# Patient Record
Sex: Male | Born: 1955
Health system: Southern US, Community
[De-identification: ages and names within clinical notes are randomized; demographics above are authoritative.]

## PROBLEM LIST (undated history)

## (undated) DIAGNOSIS — I10 Essential (primary) hypertension: Secondary | ICD-10-CM

## (undated) HISTORY — PX: NO PAST SURGERIES: SHX2092

---

## 2001-04-04 ENCOUNTER — Encounter: Payer: Self-pay | Admitting: *Deleted

## 2001-04-04 ENCOUNTER — Emergency Department (HOSPITAL_COMMUNITY): Admission: EM | Admit: 2001-04-04 | Discharge: 2001-04-04 | Payer: Self-pay | Admitting: *Deleted

## 2002-10-08 ENCOUNTER — Encounter: Payer: Self-pay | Admitting: Emergency Medicine

## 2002-10-08 ENCOUNTER — Emergency Department (HOSPITAL_COMMUNITY): Admission: EM | Admit: 2002-10-08 | Discharge: 2002-10-08 | Payer: Self-pay | Admitting: Emergency Medicine

## 2009-06-18 ENCOUNTER — Emergency Department (HOSPITAL_COMMUNITY): Admission: EM | Admit: 2009-06-18 | Discharge: 2009-06-18 | Payer: Self-pay | Admitting: Emergency Medicine

## 2011-03-19 ENCOUNTER — Encounter: Payer: Self-pay | Admitting: *Deleted

## 2011-03-19 ENCOUNTER — Emergency Department (HOSPITAL_COMMUNITY): Payer: Medicare Other

## 2011-03-19 ENCOUNTER — Emergency Department (HOSPITAL_COMMUNITY)
Admission: EM | Admit: 2011-03-19 | Discharge: 2011-03-20 | Disposition: A | Discharge status: Home / Self Care | Payer: Medicare Other | Attending: Emergency Medicine | Admitting: Emergency Medicine

## 2011-03-19 DIAGNOSIS — Z7982 Long term (current) use of aspirin: Secondary | ICD-10-CM | POA: Insufficient documentation

## 2011-03-19 DIAGNOSIS — S42213A Unspecified displaced fracture of surgical neck of unspecified humerus, initial encounter for closed fracture: Secondary | ICD-10-CM

## 2011-03-19 DIAGNOSIS — W1789XA Other fall from one level to another, initial encounter: Secondary | ICD-10-CM | POA: Insufficient documentation

## 2011-03-19 DIAGNOSIS — F17200 Nicotine dependence, unspecified, uncomplicated: Secondary | ICD-10-CM | POA: Insufficient documentation

## 2011-03-19 MED ORDER — HYDROMORPHONE HCL 1 MG/ML IJ SOLN
0.5000 mg | Freq: Once | INTRAMUSCULAR | Status: AC
  Administered 2011-03-19: 0.5 mg via INTRAVENOUS
  Filled 2011-03-19: qty 1

## 2011-03-19 MED ORDER — HYDROMORPHONE HCL 1 MG/ML IJ SOLN
1.0000 mg | Freq: Once | INTRAMUSCULAR | Status: AC
  Administered 2011-03-19: 1 mg via INTRAVENOUS
  Filled 2011-03-19: qty 1

## 2011-03-19 MED ORDER — OXYCODONE-ACETAMINOPHEN 5-325 MG PO TABS: 1.0000 | ORAL_TABLET | Freq: Four times a day (QID) | ORAL | Status: AC | PRN

## 2011-03-19 NOTE — ED Provider Notes (Addendum)
History    Scribed for Gavin Pound. Bryndon Cumbie, MD, the patient was seen in room APA18/APA18. This chart was scribed by Katha Cabal. This patient's care was started at 9:45PM.  CSN: [id number] Arrival date & time: 03/19/2011  9:03 PM  Chief Complaint  Patient presents with  . Fall  . Shoulder Pain    left shoulder   Patient is a 55 y.o. male presenting with fall and shoulder pain.  Fall  Shoulder Pain   A 55 year old male presents to the ED with constant Left shoulder pain onset tonight.  Pain aggravated by movement.  Pt denies numbness and tingling.  Pt was on the porch and there were no available seats.  Pt leaned on railing of of porch about 4 feet high and toppled over and landed on left shoulder.  Pt is right handed.  Pt last ate around noon and admitted to drinking a beer a couple hours ago and drank water prior to arrival.   PAST MEDICAL HISTORY:  Past Medical History  Diagnosis Date  . Neurofibromatosis (severe)      PAST SURGICAL HISTORY:  History reviewed. No pertinent past surgical history.  MEDICATIONS:  Previous Medications   ASPIRIN-SALICYLAMIDE-CAFFEINE (BC FAST PAIN RELIEF) [id number] MG PACK    Take by mouth.       ALLERGIES:  Allergies as of 03/19/2011  . (No Known Allergies)     FAMILY HISTORY:  History reviewed. No pertinent family history.   SOCIAL HISTORY: History   Social History  . Marital Status: Single    Spouse Name: N/A    Number of Children: N/A  . Years of Education: N/A   Social History Main Topics  . Smoking status: Current Some Day Smoker  . Smokeless tobacco: None  . Alcohol Use: Yes     occasionally  . Drug Use: No  . Sexually Active:    Other Topics Concern  . None   Social History Narrative  . None     Review of Systems .ROS  10 Systems reviewed and are negative for acute change except as noted in the HPI.   Physical Exam  BP 131/93  Pulse 81  Temp(Src) 98.1 F (36.7 C) (Oral)  Resp 20  Ht 6' 0.5" (1.842 m)   Wt 146 lb (66.225 kg)  BMI 19.53 kg/m2  SpO2 100%  Physical Exam  Nursing note and vitals reviewed. Constitutional: He is oriented to person, place, and time. He appears well-developed and well-nourished.  HENT:  Head: Normocephalic and atraumatic.  Eyes: Pupils are equal, round, and reactive to light.  Neck: Normal range of motion and full passive range of motion without pain. Neck supple. No spinous process tenderness present. Normal range of motion present.  Cardiovascular: Normal rate.   Pulses:      Radial pulses are 2+ on the left side.  Pulmonary/Chest: Effort normal. No respiratory distress.  Musculoskeletal:       Right shoulder: Normal. Tenderness: mild Left shoulder.       Left shoulder: He exhibits decreased range of motion, tenderness and deformity.       Left shoulder deformity appears like possible dislocation.  No left elbow painand no left forearm tenderness.  Pt is able to move fingers and thumb.  Distal neurovascular intact.   Neurological: He is alert and oriented to person, place, and time.  Skin: Skin is warm and dry.  Psychiatric: He has a normal mood and affect. His behavior is normal.    ED  Course  Procedures  OTHER DATA REVIEWED: Nursing notes, vital signs, and past medical records reviewed.    DIAGNOSTIC STUDIES: Oxygen Saturation is 100% on room air,  nml by my interpretation.     LABS / RADIOLOGY: No results found for this or any previous visit. Dg Shoulder Left Port  03/19/2011  *RADIOLOGY REPORT*  Clinical Data: Fall, pain.  PORTABLE LEFT SHOULDER - 2+ VIEW  Comparison: None.  Findings: The patient has a surgical neck fracture of the left humerus with one shaft width anterior displacement. Acromioclavicular joint is intact.  No other fracture is identified.  Multiple soft tissue densities project over the upper chest wall.  IMPRESSION:  1.  Surgical neck fracture left humerus. 2.  Multiple soft tissue nodules.  Question neurofibromatosis.   Original Report Authenticated By: Bernadene Bell. D'ALESSIO, M.D.       MDM: The patient is status post fall from about 3-4 feet off of a railing from his back porch. The patient denies head injury loss of consciousness neck pain or back pain. Patient has pain and deformity isolated to left shoulder. On initial exam, to me it appeared that patient may have had a shoulder dislocation. However upon review of his plain films in concurrence with radiology interpretation is acute surgical neck fracture of his left humerus. Patient is intact to distal neurovascular status. Strong grip intact and intact to axillary ulnar median and radial nerves of the left UE. I will discuss with orthopedist on call to ensure the patient has appropriate followup. They'll continue to adequately treat his pain at this point and ordered a left shoulder immobilizer for stabilization.    PLAN: d/c The patient is to return the emergency department if there is any worsening of symptoms. I have reviewed the discharge instructions with the patient/family   CONDITION ON DISCHARGE: stable    I personally performed the services described in this documentation, which was scribed in my presence. The recorded information has been reviewed and considered. Gavin Pound. Oletta Lamas, MD  11:40 PM Spoke to Dr. Charlann Boxer who recommends follow up with Dr. Rennis Chris or Ranell Patrick with his group.  He agrees with immobilizer and analgesics for now.    Gavin Pound. Oletta Lamas, MD 03/19/11 2340  Gavin Pound. Oletta Lamas, MD 03/19/11 4098

## 2011-03-19 NOTE — ED Notes (Signed)
Pt c/o pain to left shoulder, pt states he is unable to lift or move shoulder; pt states he was sitting on a rail on his porch and the rail gave away and he fell approximately 3 ft to the ground landing on his left arm

## 2011-03-19 NOTE — Discharge Instructions (Signed)
Narcotic and benzodiazepine use may cause drowsiness, slowed breathing or dependence.  Please use with caution and do not drive, operate machinery or watch young children alone while taking them.  Taking combinations of these medications or drinking alcohol will potentiate these effects.    

## 2011-03-23 ENCOUNTER — Encounter (HOSPITAL_COMMUNITY): Payer: Self-pay | Admitting: *Deleted

## 2011-03-25 ENCOUNTER — Ambulatory Visit (HOSPITAL_COMMUNITY): Payer: Medicare Other

## 2011-03-25 ENCOUNTER — Ambulatory Visit (HOSPITAL_COMMUNITY)
Admission: RE | Admit: 2011-03-25 | Discharge: 2011-03-26 | Disposition: A | Discharge status: Home / Self Care | Payer: Medicare Other | Source: Ambulatory Visit | Attending: Orthopedic Surgery | Admitting: Orthopedic Surgery

## 2011-03-25 DIAGNOSIS — W19XXXA Unspecified fall, initial encounter: Secondary | ICD-10-CM | POA: Insufficient documentation

## 2011-03-25 DIAGNOSIS — J449 Chronic obstructive pulmonary disease, unspecified: Secondary | ICD-10-CM | POA: Insufficient documentation

## 2011-03-25 DIAGNOSIS — J4489 Other specified chronic obstructive pulmonary disease: Secondary | ICD-10-CM | POA: Insufficient documentation

## 2011-03-25 DIAGNOSIS — Z01812 Encounter for preprocedural laboratory examination: Secondary | ICD-10-CM | POA: Insufficient documentation

## 2011-03-25 DIAGNOSIS — Q8500 Neurofibromatosis, unspecified: Secondary | ICD-10-CM | POA: Insufficient documentation

## 2011-03-25 DIAGNOSIS — Z79899 Other long term (current) drug therapy: Secondary | ICD-10-CM | POA: Insufficient documentation

## 2011-03-25 DIAGNOSIS — S42309A Unspecified fracture of shaft of humerus, unspecified arm, initial encounter for closed fracture: Secondary | ICD-10-CM | POA: Insufficient documentation

## 2011-03-25 DIAGNOSIS — F17200 Nicotine dependence, unspecified, uncomplicated: Secondary | ICD-10-CM | POA: Insufficient documentation

## 2011-03-25 LAB — URINALYSIS, ROUTINE W REFLEX MICROSCOPIC
Bilirubin Urine: NEGATIVE
Hgb urine dipstick: NEGATIVE
Nitrite: NEGATIVE
Specific Gravity, Urine: 1.01 (ref 1.005–1.030)
pH: 6.5 (ref 5.0–8.0)

## 2011-03-25 LAB — COMPREHENSIVE METABOLIC PANEL
ALT: 9 U/L (ref 0–53)
AST: 18 U/L (ref 0–37)
Calcium: 9.5 mg/dL (ref 8.4–10.5)
GFR calc Af Amer: >60 mL/min (ref >60)
Sodium: 139 mEq/L (ref 135–145)
Total Protein: 6.5 g/dL (ref 6.0–8.3)

## 2011-03-25 LAB — CBC
Platelets: 199 10*3/uL (ref 150–400)
RBC: 3.9 MIL/uL — ABNORMAL LOW (ref 4.22–5.81)
WBC: 6.5 10*3/uL (ref 4.0–10.5)

## 2011-03-25 LAB — APTT: aPTT: 33 seconds (ref 24–37)

## 2011-03-25 LAB — PROTIME-INR
INR: 1.08 (ref 0.00–1.49)
Prothrombin Time: 14.2 seconds (ref 11.6–15.2)

## 2011-03-25 LAB — SURGICAL PCR SCREEN
MRSA, PCR: NEGATIVE
Staphylococcus aureus: NEGATIVE

## 2011-05-18 NOTE — Op Note (Signed)
Kevin Dalton, Kevin Dalton NO.:  [id number]  MEDICAL RECORD NO.:  [id number]  LOCATION:  5040                         FACILITY:  [facility]  PHYSICIAN:  Vania Rea. Deo Mehringer, M.D.  DATE OF BIRTH:  1956-03-22  DATE OF PROCEDURE:  03/25/2011 DATE OF DISCHARGE:                              OPERATIVE REPORT   PREOPERATIVE DIAGNOSIS:  Displaced left two-part proximal humerus fracture.  POSTOPERATIVE DIAGNOSIS:  Displaced left two-part proximal humerus fracture.  PROCEDURE:  Open reduction and internal fixation of displaced left two- part proximal humerus fracture utilizing an SNP plate.  SURGEON:  Vania Rea. Esten Dollar, M.D.  Threasa HeadsFrench Ana A. Shuford, PA-C.  ANESTHESIA:  General endotracheal as well as an interscalene block.  ESTIMATED BLOOD LOSS:  150 mL.  DRAINS:  None.  HISTORY:  Kevin Dalton is a 55 year old gentleman who fell this past weekend sustaining a displaced left two-part proximal humerus fracture.  He was seen initially in the [facility] emergency room, placed in a sling, was evaluated in my office earlier this week where he was found to have a severely displaced proximal humerus fracture with the humeral shaft segment and paling the skin anteriorly and tenting the skin, but there was no obvious skin violation.  This was indeed a closed fracture.  He was found to be grossly neurovascularly intact.  Due to the degree of displacement and potential for skin breakdown, he was brought to the operating room at this time for planned open reduction and internal fixation.  Preoperatively, counseled Kevin Dalton on treatment options as well as risks versus benefits thereof.  Possible surgical complications were reviewed including the potential for bleeding, infection, neurovascular injury, malunion, nonunion, loss of fixation, and possible need for additional surgery.  He understands and accepts and agrees with our planned procedure.  PROCEDURE IN DETAIL:  After  undergoing routine preop evaluation, the patient received prophylactic antibiotics and an interscalene block was established in the holding area by the Anesthesia Department.  Placed supine on the operating  table and underwent smooth induction of general endotracheal anesthesia.  Placed in beach-chair position and appropriately padded and protected.  Left shoulder girdle region was sterilely prepped and draped in standard fashion.  Time-out was called. Fluoroscopy was brought in and used to confirm that we could properly visualize the shoulder.  Following this, we then performed a anterolateral deltoid splitting approach with an incision of lateral margin of the acromion and then extending distally for approximately 5 cm.  Of note, we did place the incision such that we avoided the very large multiple neurofibromas about the shoulder.  Dissection was carried deeply.  Electrocautery was used for hemostasis.  The deltoid was then split at the junction between the anterior mid thirds with care taken to protect the distal nerve structures.  The subacromial/subdeltoid bursal space was then freed of scar tissue.  The impacted segment of the humeral shaft was then carefully disengaged from the muscle and subcutaneous tissues.  The fracture site was irrigated and under direct visualization, reduction was achieved and fluoroscopic imaging showed overall good alignment.  Following this, we then prepared for the insertion of the intramedullary implant.  We did  use a rongeur to cut a small trough in the lateral cortex of the humerus proximally so that we could that seat the SNP plate to the appropriate depth.  Once this was completed, we confirmed seating of the implant to the proper depth. Reduction was then obtained and a central guidewire was then placed in the center of the humeral head, noted both on two orthogonal views.  We then placed a series of pegs into the peripheral peg holes and  then replaced the central pin with a central peg, and all these obtained good penetration and purchase of the humeral head bone.  Next, we then used the outrigger guide to place three unicortical locking screws into the implant and the shaft of the humerus.  The outrigger was removed.  Final x-rays were obtained.  We did use live fluoro to confirm that all pegs were contained within the humeral head, and there was free motion, good alignment, good stability.  The wound was then irrigated.  The deltoid split was then repaired with figure-of-eight #1 Vicryl sutures.  2-0 Vicryl used for the subcu layer and intracuticular 3-0 Monocryl for the skin followed by Steri-Strips.  Dry dressing was applied.  The left arm was placed in sling.  The patient was then awakened, extubated, and taken to recovery room in stable condition.     Vania Rea. Huber Mathers, M.D.     KMS/MEDQ  D:  03/25/2011  T:  03/25/2011  Job:  [id number]  Electronically Signed by Francena Hanly M.D. on 05/18/2011 06:43:05 PM

## 2011-06-01 ENCOUNTER — Ambulatory Visit (HOSPITAL_COMMUNITY)
Admission: RE | Admit: 2011-06-01 | Discharge: 2011-06-01 | Disposition: A | Discharge status: Home / Self Care | Payer: Medicare Other | Source: Ambulatory Visit | Attending: Orthopedic Surgery | Admitting: Orthopedic Surgery

## 2011-06-01 DIAGNOSIS — M25619 Stiffness of unspecified shoulder, not elsewhere classified: Secondary | ICD-10-CM | POA: Insufficient documentation

## 2011-06-01 DIAGNOSIS — M6281 Muscle weakness (generalized): Secondary | ICD-10-CM | POA: Insufficient documentation

## 2011-06-01 DIAGNOSIS — M25519 Pain in unspecified shoulder: Secondary | ICD-10-CM | POA: Insufficient documentation

## 2011-06-01 DIAGNOSIS — IMO0001 Reserved for inherently not codable concepts without codable children: Secondary | ICD-10-CM | POA: Insufficient documentation

## 2011-06-01 DIAGNOSIS — S42293A Other displaced fracture of upper end of unspecified humerus, initial encounter for closed fracture: Secondary | ICD-10-CM | POA: Insufficient documentation

## 2011-06-01 NOTE — Progress Notes (Signed)
Occupational Therapy Evaluation  Patient Details  Name: Kevin Dalton MRN: [id number] Date of Birth: 1955/12/15  Today's Date: 06/01/2011 Time: 3086-5784 OT Eval 1350-1405 15' Manual Therapy [phone] 11' Therapeutic Exercises [phone] 10' Time Calculation (min): 38 min Visit#: 1  of 16   Re-eval: 06/29/11  Assessment Diagnosis: S/P Left Proximal Humerus Fracture - Dr. Rennis Chris Surgical Date: 03/25/11 Next MD Visit: unknown Prior Therapy: none  Past Medical History:  Past Medical History  Diagnosis Date  . Neurofibromatosis    Past Surgical History: No past surgical history on file.  Subjective Symptoms/Limitations Symptoms: S:  I fell off of a porch in Aug and broke my left shoulder.   Limitations: History:  Mr. Taboada states that he was leaning against his porch railing when it gave way.  He fell, landing on his left shoulder and fracturing his left proximal humerus.  He had surgery to repair the fracture.  He has been completing exercises at home, but is still experiencing significant loss of movement and increased pain.  He has been referred by Dr. Rennis Chris to occupational therapy for evaluation and treatment. Pain Assessment Currently in Pain?: No/denies  Assessment LUE Assessment LUE Assessment:  (asssessed in supine ER and IR 0 abd) LUE AROM (degrees) Left Shoulder Flexion  0-170: 90 Degrees Left Shoulder ABduction 0-40: 60 Degrees Left Shoulder Internal Rotation  0-70: 90 Degrees Left Shoulder External Rotation  0-90: 20 Degrees LUE PROM (degrees) Left Shoulder Flexion  0-170: 120 Degrees Left Shoulder ABduction 0-40: 115 Degrees Left Shoulder Internal Rotation  0-70: 90 Degrees Left Shoulder External Rotation  0-90: 64 Degrees LUE Strength Left Shoulder Flexion: 3+/5 Left Shoulder ABduction: 3+/5 Left Shoulder Internal Rotation: 3+/5 Left Shoulder External Rotation: 3+/5  Exercise/Treatments  06/01/11 0700  Shoulder Exercises: Supine  Protraction PROM;5  reps;AAROM;10 reps  Horizontal ABduction PROM;5 reps;AAROM;10 reps  External Rotation PROM;5 reps;AAROM;10 reps  Internal Rotation PROM;5 reps;AAROM;10 reps  Flexion PROM;5 reps;AAROM;10 reps  ABduction PROM;5 reps;AAROM;10 reps   Manual Therapy Manual Therapy: Myofascial release Myofascial Release: MFR and manual stretching to left upper arm, scapular, and shoulder region to decrease pain and fascial restrictions and increase AROM and strength. [phone]  Occupational Therapy Assessment and Plan OT Assessment and Plan Clinical Impression Statement: A:  Patient presents with decreased mobility and strength and increased pain and stiffness s/p left proximal humerus fracture, causing decreased I with B/IADLs and leisure activities. Rehab Potential: Good OT Frequency: Min 2X/week OT Duration: 8 weeks OT Treatment/Interventions: Self-care/ADL training;Therapeutic exercise;Manual therapy;Patient/family education;Other (comment) (modalities PRN.  HEP:  dowel in supine) OT Plan: P:  Skilled OT intervention to decrease pain and restrictions and increase AROM and strength needed to complete ADLs with LUE.  Treatment Plan:  MFR as outlined above.  Ther Ex:  Supine PROM and dowel, progress to AROM as tolerated.  seated elev, ext, ret, row.  therapy ball flex and abd, thumb tacks, prot/ret//elev/dep, pulleys, progress as tolerated.   Goals Short Term Goals Time to Complete Short Term Goals: 4 weeks Short Term Goal 1: Patient will be educated on a HEP. Short Term Goal 2: Patient will increase PROM to [facility] for increased ability to don his coats. Short Term Goal 3: Patient will increase his strength to 4-/5 in his left shoulder for increased ability to lift cooking pots and pans. Short Term Goal 4: Patient will decrease pain to 4/10 when actively using his left arm. Short Term Goal 5: Patient will decrease fascial restrictions from max to mod in his  left shoulder region. Long Term Goals Time to Complete  Long Term Goals: 8 weeks Long Term Goal 1: Patient will return to prior level of independence with all B/IADLs and leisure activities. Long Term Goal 2: Patient will increase left shoulder AROM to [facility] for increased ability to don his coat. Long Term Goal 3: Patient will increase his left shoulder strength to 4/5 for increased ability to lift household cleaning items. Long Term Goal 4: Patient will decrease pain to 2/10 in his left shoulder while completing his ADLs. Long Term Goal 5: Patient will decrease fascial restrictions to minimal in his left shoulder region. End of Session Patient Active Problem List  Diagnoses  . Other closed fractures of upper end of humerus  . Pain in joint, shoulder region  . Muscle weakness (generalized)   End of Session Activity Tolerance: Patient tolerated treatment well General Behavior During Session: [facility] for tasks performed Cognition: [facility] for tasks performed  Time Calculation Start Time: 1350 Stop Time: 1428 Time Calculation (min): 38 min  Shirlean Mylar, OTR/L  06/01/2011, 3:02 PM  Physician Documentation Your signature is required to indicate approval of the treatment plan as stated above.  Please sign and either send electronically or make a copy of this report for your files and return this physician signed original.  Please mark one 1.__approve of plan  2. ___approve of plan with the following conditions.   ______________________________                                                          _____________________ Physician Signature                                                                                                             Date

## 2011-06-08 ENCOUNTER — Ambulatory Visit (HOSPITAL_COMMUNITY): Payer: Medicare Other | Admitting: Specialist

## 2011-06-09 ENCOUNTER — Telehealth (HOSPITAL_COMMUNITY): Payer: Self-pay

## 2011-06-10 ENCOUNTER — Ambulatory Visit (HOSPITAL_COMMUNITY)
Admission: RE | Admit: 2011-06-10 | Discharge: 2011-06-10 | Disposition: A | Discharge status: Home / Self Care | Payer: Medicare Other | Source: Ambulatory Visit | Attending: Orthopedic Surgery | Admitting: Orthopedic Surgery

## 2011-06-10 DIAGNOSIS — M25619 Stiffness of unspecified shoulder, not elsewhere classified: Secondary | ICD-10-CM | POA: Insufficient documentation

## 2011-06-10 DIAGNOSIS — S42293A Other displaced fracture of upper end of unspecified humerus, initial encounter for closed fracture: Secondary | ICD-10-CM

## 2011-06-10 DIAGNOSIS — IMO0001 Reserved for inherently not codable concepts without codable children: Secondary | ICD-10-CM | POA: Insufficient documentation

## 2011-06-10 DIAGNOSIS — M6281 Muscle weakness (generalized): Secondary | ICD-10-CM

## 2011-06-10 DIAGNOSIS — M25519 Pain in unspecified shoulder: Secondary | ICD-10-CM

## 2011-06-10 NOTE — Progress Notes (Signed)
Occupational Therapy Treatment  Patient Details  Name: Kevin Dalton MRN: [id number] Date of Birth: [birth year]  Today's Date: 06/10/2011 Time: 0102-7253 Time Calculation (min): 37 min Manual Therapy [phone] 14' Therapeutic Exercises [phone] 22' Visit#: 2  of 16   Re-eval: 06/29/11    Subjective Symptoms/Limitations Symptoms: S:  I have been doing my exercises.  I dont have any pain today. Pain Assessment Currently in Pain?: No/denies  O:  Exercise/Treatments Supine Protraction: PROM;AAROM;10 reps Horizontal ABduction: PROM;AAROM;10 reps External Rotation: PROM;AAROM;10 reps Internal Rotation: PROM;AAROM;10 reps Flexion: PROM;AAROM;10 reps ABduction: PROM;AAROM;10 reps Seated Elevation: AROM;10 reps Extension: AROM;10 reps Retraction: AROM;10 reps Row: AROM;10 reps Pulleys Flexion: 2 minutes ABduction: 2 minutes Therapy Ball Flexion: 15 reps ABduction: 15 reps   Manual Therapy Manual Therapy: Myofascial release Myofascial Release: MFR and manual stretching to left upper arm, scapular region, and shoulder region to decrease pain and fascial restrictions and increase AROM and strength.  [phone]   Occupational Therapy Assessment and Plan OT Assessment and Plan Clinical Impression Statement: A:  PROM throught 100 degrees flexion and abduction in supine. OT Plan: P:  Attempt thumbtacks, prot/ret//elev/dep and low wall wash.   Goals Short Term Goals Time to Complete Short Term Goals: 4 weeks Short Term Goal 1: Patient will be educated on a HEP. Short Term Goal 1 Progress: Progressing toward goal Short Term Goal 2: Patient will increase PROM to [facility] for increased ability to don his coats. Short Term Goal 2 Progress: Progressing toward goal Short Term Goal 3: Patient will increase his strength to 4-/5 in his left shoulder for increased ability to lift cooking pots and pans. Short Term Goal 3 Progress: Progressing toward goal Short Term Goal 4: Patient will decrease  pain to 4/10 when actively using his left arm. Short Term Goal 4 Progress: Progressing toward goal Short Term Goal 5: Patient will decrease fascial restrictions from max to mod in his left shoulder region. Short Term Goal 5 Progress: Progressing toward goal Long Term Goals Time to Complete Long Term Goals: 8 weeks Long Term Goal 1: Patient will return to prior level of independence with all B/IADLs and leisure activities. Long Term Goal 1 Progress: Progressing toward goal Long Term Goal 2: Patient will increase left shoulder AROM to [facility] for increased ability to don his coat. Long Term Goal 2 Progress: Progressing toward goal Long Term Goal 3: Patient will increase his left shoulder strength to 4/5 for increased ability to lift household cleaning items. Long Term Goal 3 Progress: Progressing toward goal Long Term Goal 4: Patient will decrease pain to 2/10 in his left shoulder while completing his ADLs. Long Term Goal 4 Progress: Progressing toward goal Long Term Goal 5: Patient will decrease fascial restrictions to minimal in his left shoulder region. Long Term Goal 5 Progress: Progressing toward goal End of Session Patient Active Problem List  Diagnoses  . Other closed fractures of upper end of humerus  . Pain in joint, shoulder region  . Muscle weakness (generalized)   End of Session Activity Tolerance: Patient tolerated treatment well General Behavior During Session: [facility] for tasks performed Cognition: [facility] for tasks performed   Shirlean Mylar, OTR/L  06/10/2011, 10:09 AM

## 2011-06-16 ENCOUNTER — Ambulatory Visit (HOSPITAL_COMMUNITY)
Admission: RE | Admit: 2011-06-16 | Discharge: 2011-06-16 | Disposition: A | Discharge status: Home / Self Care | Payer: Medicare Other | Source: Ambulatory Visit | Attending: Orthopedic Surgery | Admitting: Orthopedic Surgery

## 2011-06-16 DIAGNOSIS — S42293A Other displaced fracture of upper end of unspecified humerus, initial encounter for closed fracture: Secondary | ICD-10-CM

## 2011-06-16 DIAGNOSIS — M25519 Pain in unspecified shoulder: Secondary | ICD-10-CM

## 2011-06-16 DIAGNOSIS — M6281 Muscle weakness (generalized): Secondary | ICD-10-CM

## 2011-06-16 NOTE — Progress Notes (Signed)
Occupational Therapy Treatment  Patient Details  Name: Kevin Dalton MRN: [id number] Date of Birth: Dec 27, 1955  Today's Date: 06/16/2011 Time: 5621-3086 Time Calculation (min): 39 min Visit#: 3  of 16   Re-eval: 06/29/11 Manuel Therapy  5784-6962 23' Therapeutic Exercise  [phone] 15'    Subjective Symptoms/Limitations Symptoms: S:  It just hurts at night time mostly. Special Tests: MFR and manual stretching to left upper arm, scapular, and shoulder region to decrease pain and fascial restrictions and increase AROM and strength Pain Assessment Currently in Pain?: No/denies   Exercise/Treatments Supine Protraction: PROM;AAROM;12 reps Horizontal ABduction: PROM;AAROM;12 reps External Rotation: PROM;AAROM;12 reps Internal Rotation: PROM;AAROM;12 reps Flexion: PROM;AAROM;12 reps ABduction: PROM;AAROM;12 reps Seated Elevation: AROM;10 reps Extension: AROM;10 reps Retraction: AROM;10 reps Row: AROM;10 reps Pulleys Flexion: 2 minutes ABduction: 2 minutes Therapy Ball Flexion: 15 reps ABduction: 15 reps  Manual Therapy Manual Therapy: Myofascial release Myofascial Release: MFR and manual stretching to left upper arm, scapular, and shoulder region to decrease pain and fascial restrictions and increase AROM and strength  Occupational Therapy Assessment and Plan OT Assessment and Plan Clinical Impression Statement: A:  Unable to increase PROM past 100 degrees flexion and  abduction secondary to pain. Rehab Potential: Good OT Plan: P:  Attempt thumbtacks, prot/ret/elev/dep and low wall wash.   Goals Short Term Goals Time to Complete Short Term Goals: 4 weeks Short Term Goal 1: Patient will be educated on a HEP. Short Term Goal 2: Patient will increase PROM to [facility] for increased ability to don his coats. Short Term Goal 3: Patient will increase his strength to 4-/5 in his left shoulder for increased ability to lift cooking pots and pans. Short Term Goal 4: Patient will  decrease pain to 4/10 when actively using his left arm. Short Term Goal 5: Patient will decrease fascial restrictions from max to mod in his left shoulder region. Long Term Goals Time to Complete Long Term Goals: 8 weeks Long Term Goal 1: Patient will return to prior level of independence with all B/IADLs and leisure activities. Long Term Goal 2: Patient will increase left shoulder AROM to [facility] for increased ability to don his coat. Long Term Goal 3: Patient will increase his left shoulder strength to 4/5 for increased ability to lift household cleaning items. Long Term Goal 4: Patient will decrease pain to 2/10 in his left shoulder while completing his ADLs. Long Term Goal 5: Patient will decrease fascial restrictions to minimal in his left shoulder region. End of Session Patient Active Problem List  Diagnoses  . Other closed fractures of upper end of humerus  . Pain in joint, shoulder region  . Muscle weakness (generalized)   End of Session Activity Tolerance: Patient tolerated treatment well General Behavior During Session: [facility] for tasks performed Cognition: [facility] for tasks performed   Rush Salce L. Hassie Mandt, COTA/L  06/16/2011, 11:49 AM

## 2011-06-17 ENCOUNTER — Telehealth (HOSPITAL_COMMUNITY): Payer: Self-pay

## 2011-06-17 ENCOUNTER — Ambulatory Visit (HOSPITAL_COMMUNITY): Payer: Medicare Other | Admitting: Specialist

## 2011-06-22 ENCOUNTER — Ambulatory Visit (HOSPITAL_COMMUNITY): Payer: Medicare Other | Admitting: Occupational Therapy

## 2011-06-22 ENCOUNTER — Telehealth (HOSPITAL_COMMUNITY): Payer: Self-pay | Admitting: Occupational Therapy

## 2011-06-24 ENCOUNTER — Ambulatory Visit (HOSPITAL_COMMUNITY): Payer: Medicare Other | Admitting: Specialist

## 2011-06-28 ENCOUNTER — Ambulatory Visit (HOSPITAL_COMMUNITY)
Admission: RE | Admit: 2011-06-28 | Discharge: 2011-06-28 | Disposition: A | Discharge status: Home / Self Care | Payer: Medicare Other | Source: Ambulatory Visit | Attending: Orthopedic Surgery | Admitting: Orthopedic Surgery

## 2011-06-28 DIAGNOSIS — M25519 Pain in unspecified shoulder: Secondary | ICD-10-CM

## 2011-06-28 DIAGNOSIS — S42293A Other displaced fracture of upper end of unspecified humerus, initial encounter for closed fracture: Secondary | ICD-10-CM

## 2011-06-28 DIAGNOSIS — M6281 Muscle weakness (generalized): Secondary | ICD-10-CM

## 2011-06-28 NOTE — Progress Notes (Signed)
Occupational Therapy Treatment  Patient Details  Name: Kevin Dalton MRN: [id number] Date of Birth: Jan 04, 1956  Today's Date: 06/28/2011 Time: 4098-1191 Time Calculation (min): 36 min Manual Therapy [id number] 16' Therapeutic Exercises [phone] 65' Visit#: 4  of 16   Re-eval: 06/29/11    Subjective Symptoms/Limitations Symptoms: S:  It hurts at night a little bit. Pain Assessment Currently in Pain?: No/denies  O:  Exercise/Treatments  06/28/11 0700 Shoulder Exercises: Supine Protraction PROM;AROM;10 reps;AAROM;15 reps Horizontal ABduction PROM;AROM;10 reps;AAROM;15 reps External Rotation PROM;AROM;10 reps;AAROM;15 reps Internal Rotation PROM;AROM;10 reps;AAROM;15 reps Flexion PROM;AROM;10 reps;AAROM;15 reps ABduction PROM;AROM;10 reps;AAROM;15 reps Shoulder Exercises: Seated Elevation AROM;15 reps Extension AROM;15 reps Retraction AROM;15 reps Shoulder Exercises: Pulleys Flexion (hold, resume next visit) ABduction (hold, resume next visit) Shoulder Exercises: Therapy Ball Flexion 25 reps ABduction 25 reps Shoulder Exercises: ROM/Strengthening Wall Wash attempted but unable Thumb Tacks attempted, could only complete 10 seconds Prot/Ret//Elev/Dep completed for 30 seconds  Manual Therapy Manual Therapy: Myofascial release Myofascial Release: MFR and manual stretching to left upper arm, scapular, and shoulder region to decrease pain and fascial restrictions and increase AROM and strength.  [id number]  Occupational Therapy Assessment and Plan OT Assessment and Plan Clinical Impression Statement: A:  Added AROM in supine this date, required assistance with horiz abd and abd. OT Plan: P:  REASSESS   Goals Short Term Goals Time to Complete Short Term Goals: 4 weeks Short Term Goal 1: Patient will be educated on a HEP. Short Term Goal 1 Progress: Progressing toward goal Short Term Goal 2: Patient will increase PROM to [facility] for increased ability to don his coats. Short  Term Goal 2 Progress: Progressing toward goal Short Term Goal 3: Patient will increase his strength to 4-/5 in his left shoulder for increased ability to lift cooking pots and pans. Short Term Goal 3 Progress: Progressing toward goal Short Term Goal 4: Patient will decrease pain to 4/10 when actively using his left arm. Short Term Goal 4 Progress: Progressing toward goal Short Term Goal 5: Patient will decrease fascial restrictions from max to mod in his left shoulder region. Short Term Goal 5 Progress: Progressing toward goal Long Term Goals Time to Complete Long Term Goals: 8 weeks Long Term Goal 1: Patient will return to prior level of independence with all B/IADLs and leisure activities. Long Term Goal 1 Progress: Progressing toward goal Long Term Goal 2: Patient will increase left shoulder AROM to [facility] for increased ability to don his coat. Long Term Goal 2 Progress: Progressing toward goal Long Term Goal 3: Patient will increase his left shoulder strength to 4/5 for increased ability to lift household cleaning items. Long Term Goal 3 Progress: Progressing toward goal Long Term Goal 4: Patient will decrease pain to 2/10 in his left shoulder while completing his ADLs. Long Term Goal 4 Progress: Progressing toward goal Long Term Goal 5: Patient will decrease fascial restrictions to minimal in his left shoulder region. Long Term Goal 5 Progress: Progressing toward goal End of Session Patient Active Problem List  Diagnoses  . Other closed fractures of upper end of humerus  . Pain in joint, shoulder region  . Muscle weakness (generalized)   General Behavior During Session: [facility] for tasks performed   Shirlean Mylar, OTR/L  06/28/2011, 4:07 PM

## 2011-06-30 ENCOUNTER — Ambulatory Visit (HOSPITAL_COMMUNITY): Payer: Medicare Other | Admitting: Specialist

## 2011-06-30 ENCOUNTER — Telehealth (HOSPITAL_COMMUNITY): Payer: Self-pay

## 2011-07-05 ENCOUNTER — Ambulatory Visit (HOSPITAL_COMMUNITY): Payer: Medicare Other | Admitting: Occupational Therapy

## 2011-07-08 ENCOUNTER — Ambulatory Visit (HOSPITAL_COMMUNITY): Payer: Medicare Other | Admitting: Specialist

## 2011-07-08 ENCOUNTER — Telehealth (HOSPITAL_COMMUNITY): Payer: Self-pay

## 2017-11-16 DIAGNOSIS — R351 Nocturia: Secondary | ICD-10-CM | POA: Diagnosis not present

## 2017-11-16 DIAGNOSIS — Z681 Body mass index (BMI) 19 or less, adult: Secondary | ICD-10-CM | POA: Diagnosis not present

## 2017-11-16 DIAGNOSIS — H9190 Unspecified hearing loss, unspecified ear: Secondary | ICD-10-CM | POA: Diagnosis not present

## 2017-11-16 DIAGNOSIS — Q8500 Neurofibromatosis, unspecified: Secondary | ICD-10-CM | POA: Diagnosis not present

## 2017-11-28 DIAGNOSIS — Z0000 Encounter for general adult medical examination without abnormal findings: Secondary | ICD-10-CM | POA: Diagnosis not present

## 2017-11-28 DIAGNOSIS — Z1159 Encounter for screening for other viral diseases: Secondary | ICD-10-CM | POA: Diagnosis not present

## 2017-11-28 DIAGNOSIS — Z125 Encounter for screening for malignant neoplasm of prostate: Secondary | ICD-10-CM | POA: Diagnosis not present

## 2017-11-30 DIAGNOSIS — Q8500 Neurofibromatosis, unspecified: Secondary | ICD-10-CM | POA: Diagnosis not present

## 2017-11-30 DIAGNOSIS — Z0000 Encounter for general adult medical examination without abnormal findings: Secondary | ICD-10-CM | POA: Diagnosis not present

## 2017-11-30 DIAGNOSIS — Z681 Body mass index (BMI) 19 or less, adult: Secondary | ICD-10-CM | POA: Diagnosis not present

## 2017-11-30 DIAGNOSIS — H6120 Impacted cerumen, unspecified ear: Secondary | ICD-10-CM | POA: Diagnosis not present

## 2017-11-30 DIAGNOSIS — R350 Frequency of micturition: Secondary | ICD-10-CM | POA: Diagnosis not present

## 2017-11-30 DIAGNOSIS — M21062 Valgus deformity, not elsewhere classified, left knee: Secondary | ICD-10-CM | POA: Diagnosis not present

## 2017-12-09 DIAGNOSIS — Z720 Tobacco use: Secondary | ICD-10-CM | POA: Diagnosis not present

## 2017-12-09 DIAGNOSIS — R2681 Unsteadiness on feet: Secondary | ICD-10-CM | POA: Diagnosis not present

## 2017-12-09 DIAGNOSIS — Z833 Family history of diabetes mellitus: Secondary | ICD-10-CM | POA: Diagnosis not present

## 2017-12-09 DIAGNOSIS — R51 Headache: Secondary | ICD-10-CM | POA: Diagnosis not present

## 2017-12-09 DIAGNOSIS — Z8249 Family history of ischemic heart disease and other diseases of the circulatory system: Secondary | ICD-10-CM | POA: Diagnosis not present

## 2017-12-09 DIAGNOSIS — Z806 Family history of leukemia: Secondary | ICD-10-CM | POA: Diagnosis not present

## 2017-12-09 DIAGNOSIS — G8929 Other chronic pain: Secondary | ICD-10-CM | POA: Diagnosis not present

## 2017-12-09 DIAGNOSIS — Z808 Family history of malignant neoplasm of other organs or systems: Secondary | ICD-10-CM | POA: Diagnosis not present

## 2017-12-09 DIAGNOSIS — Z809 Family history of malignant neoplasm, unspecified: Secondary | ICD-10-CM | POA: Diagnosis not present

## 2017-12-13 ENCOUNTER — Encounter: Payer: Self-pay | Admitting: General Surgery

## 2017-12-13 ENCOUNTER — Ambulatory Visit: Payer: Medicare HMO | Admitting: General Surgery

## 2017-12-13 VITALS — BP 123/93 | HR 96 | Temp 98.2°F | Ht 77.0 in | Wt 133.0 lb

## 2017-12-13 DIAGNOSIS — Q8500 Neurofibromatosis, unspecified: Secondary | ICD-10-CM

## 2017-12-13 NOTE — Progress Notes (Signed)
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 Nevada Crane for evaluation and treatment of his skin lesions.  Apparently, he is seen another general surgeon in [place] in the remote past for removal of his neurofibromas.  He has been followed by [facility] in the past.  He was referred at that time to a plastic surgeon, but he did not go to that appointment due to transportation issues.  He does not complain of any pain or discomfort from the neurofibromas. Past Medical History:  Diagnosis Date  . Neurofibromatosis     History reviewed. No pertinent surgical history.  History reviewed. No pertinent family history.  Current Outpatient Medications on File Prior to Visit  Medication Sig Dispense Refill  . Aspirin-Salicylamide-Caffeine (BC FAST PAIN RELIEF) [phone] MG PACK Take by mouth.       No current facility-administered medications on file prior to visit.     No Known Allergies  Social History   Substance and Sexual Activity  Alcohol Use Yes   Comment: occasionally    Social History   Tobacco Use  Smoking Status Current Some Day Smoker  Smokeless Tobacco Never Used    Review of Systems  Constitutional: Negative.   HENT: Negative.   Eyes: Negative.   Respiratory: Negative.   Cardiovascular: Negative.   Gastrointestinal: Negative.   Genitourinary: Positive for frequency and urgency.  Musculoskeletal: Positive for back pain and joint pain.  Skin: Negative.   Neurological: Positive for tremors and headaches.  Endo/Heme/Allergies: Negative.   Psychiatric/Behavioral: Negative.     Objective   Vitals:   12/13/17 1355  BP: (!) 123/93  Pulse: 96  Temp: 98.2 F (36.8 C)    Physical Exam  Constitutional: He is oriented to person, place, and time. He appears well-developed and well-nourished.  HENT:  Head: Normocephalic and atraumatic.  Cardiovascular:  Normal rate, regular rhythm and normal heart sounds. Exam reveals no gallop and no friction rub.  No murmur heard. Pulmonary/Chest: Effort normal and breath sounds normal. No stridor. No respiratory distress. He has no wheezes. He has no rales.  Neurological: He is alert and oriented to person, place, and time.  Skin:  Too numerous to count neurofibromas covering his whole body.  One particular one is emanating from his left eyebrow and starting to impede his field of vision.  Vitals reviewed.   Assessment  Neurofibromatosis Plan   I told the patient that I did have the equipment to perform excision of these neurofibromas in my office like he had done before.  Given the extensiveness of the neurofibromas, and the one in particular that this around the left eye, I think he would be better served by a dermatologist or plastic surgeon.  He understands this and agrees.  The referral to dermatology or plastic surgery would need to come from his primary care physician.  He states he is seeing him tomorrow.  Follow-up here as needed.

## 2017-12-14 DIAGNOSIS — M21062 Valgus deformity, not elsewhere classified, left knee: Secondary | ICD-10-CM | POA: Diagnosis not present

## 2017-12-14 DIAGNOSIS — R350 Frequency of micturition: Secondary | ICD-10-CM | POA: Diagnosis not present

## 2017-12-14 DIAGNOSIS — Q8500 Neurofibromatosis, unspecified: Secondary | ICD-10-CM | POA: Diagnosis not present

## 2017-12-28 DIAGNOSIS — D3611 Benign neoplasm of peripheral nerves and autonomic nervous system of face, head, and neck: Secondary | ICD-10-CM | POA: Diagnosis not present

## 2017-12-28 DIAGNOSIS — D3612 Benign neoplasm of peripheral nerves and autonomic nervous system, upper limb, including shoulder: Secondary | ICD-10-CM | POA: Diagnosis not present

## 2017-12-28 DIAGNOSIS — D3613 Benign neoplasm of peripheral nerves and autonomic nervous system of lower limb, including hip: Secondary | ICD-10-CM | POA: Diagnosis not present

## 2018-01-19 DIAGNOSIS — D3613 Benign neoplasm of peripheral nerves and autonomic nervous system of lower limb, including hip: Secondary | ICD-10-CM | POA: Diagnosis not present

## 2018-01-19 DIAGNOSIS — D3612 Benign neoplasm of peripheral nerves and autonomic nervous system, upper limb, including shoulder: Secondary | ICD-10-CM | POA: Diagnosis not present

## 2018-01-19 DIAGNOSIS — D485 Neoplasm of uncertain behavior of skin: Secondary | ICD-10-CM | POA: Diagnosis not present

## 2018-01-19 DIAGNOSIS — D367 Benign neoplasm of other specified sites: Secondary | ICD-10-CM | POA: Diagnosis not present

## 2018-05-10 DIAGNOSIS — R69 Illness, unspecified: Secondary | ICD-10-CM | POA: Diagnosis not present

## 2018-06-19 DIAGNOSIS — M21062 Valgus deformity, not elsewhere classified, left knee: Secondary | ICD-10-CM | POA: Diagnosis not present

## 2018-06-19 DIAGNOSIS — Q8500 Neurofibromatosis, unspecified: Secondary | ICD-10-CM | POA: Diagnosis not present

## 2018-06-19 DIAGNOSIS — Z681 Body mass index (BMI) 19 or less, adult: Secondary | ICD-10-CM | POA: Diagnosis not present

## 2018-06-19 DIAGNOSIS — R350 Frequency of micturition: Secondary | ICD-10-CM | POA: Diagnosis not present

## 2018-06-19 DIAGNOSIS — H6120 Impacted cerumen, unspecified ear: Secondary | ICD-10-CM | POA: Diagnosis not present

## 2018-06-19 DIAGNOSIS — Z13220 Encounter for screening for lipoid disorders: Secondary | ICD-10-CM | POA: Diagnosis not present

## 2018-06-21 DIAGNOSIS — E440 Moderate protein-calorie malnutrition: Secondary | ICD-10-CM | POA: Diagnosis not present

## 2018-06-21 DIAGNOSIS — Z23 Encounter for immunization: Secondary | ICD-10-CM | POA: Diagnosis not present

## 2018-06-21 DIAGNOSIS — H6123 Impacted cerumen, bilateral: Secondary | ICD-10-CM | POA: Diagnosis not present

## 2018-06-21 DIAGNOSIS — Z681 Body mass index (BMI) 19 or less, adult: Secondary | ICD-10-CM | POA: Diagnosis not present

## 2018-06-21 DIAGNOSIS — Q8500 Neurofibromatosis, unspecified: Secondary | ICD-10-CM | POA: Diagnosis not present

## 2018-10-02 DIAGNOSIS — Q8500 Neurofibromatosis, unspecified: Secondary | ICD-10-CM | POA: Diagnosis not present

## 2018-10-02 DIAGNOSIS — Z681 Body mass index (BMI) 19 or less, adult: Secondary | ICD-10-CM | POA: Diagnosis not present

## 2018-10-02 DIAGNOSIS — E440 Moderate protein-calorie malnutrition: Secondary | ICD-10-CM | POA: Diagnosis not present

## 2018-12-21 DIAGNOSIS — Z0000 Encounter for general adult medical examination without abnormal findings: Secondary | ICD-10-CM | POA: Diagnosis not present

## 2019-01-31 ENCOUNTER — Other Ambulatory Visit: Payer: Self-pay

## 2019-01-31 ENCOUNTER — Other Ambulatory Visit: Payer: Self-pay | Admitting: Internal Medicine

## 2019-01-31 DIAGNOSIS — Z20822 Contact with and (suspected) exposure to covid-19: Secondary | ICD-10-CM

## 2019-02-04 LAB — NOVEL CORONAVIRUS, NAA: SARS-CoV-2, NAA: NOT DETECTED

## 2019-02-12 ENCOUNTER — Telehealth: Payer: Self-pay | Admitting: General Practice

## 2019-02-12 NOTE — Telephone Encounter (Signed)
Patient calling to obtain COVID 19 lab results. Advised of negative results. No further questions.

## 2019-05-29 DIAGNOSIS — Z125 Encounter for screening for malignant neoplasm of prostate: Secondary | ICD-10-CM | POA: Diagnosis not present

## 2019-05-29 DIAGNOSIS — Z0000 Encounter for general adult medical examination without abnormal findings: Secondary | ICD-10-CM | POA: Diagnosis not present

## 2019-06-05 DIAGNOSIS — Z681 Body mass index (BMI) 19 or less, adult: Secondary | ICD-10-CM | POA: Diagnosis not present

## 2019-06-05 DIAGNOSIS — R69 Illness, unspecified: Secondary | ICD-10-CM | POA: Diagnosis not present

## 2019-06-05 DIAGNOSIS — Q8500 Neurofibromatosis, unspecified: Secondary | ICD-10-CM | POA: Diagnosis not present

## 2019-06-05 DIAGNOSIS — E440 Moderate protein-calorie malnutrition: Secondary | ICD-10-CM | POA: Diagnosis not present

## 2019-08-31 ENCOUNTER — Other Ambulatory Visit: Payer: Self-pay

## 2019-08-31 ENCOUNTER — Ambulatory Visit: Payer: Medicare Other | Attending: Internal Medicine

## 2019-08-31 DIAGNOSIS — Z20822 Contact with and (suspected) exposure to covid-19: Secondary | ICD-10-CM

## 2019-09-01 LAB — NOVEL CORONAVIRUS, NAA: SARS-CoV-2, NAA: NOT DETECTED

## 2019-09-03 ENCOUNTER — Telehealth: Payer: Self-pay | Admitting: *Deleted

## 2019-09-03 NOTE — Telephone Encounter (Signed)
Patient calling for COVID result- notified negative 

## 2020-02-27 ENCOUNTER — Emergency Department (HOSPITAL_COMMUNITY)
Admission: EM | Admit: 2020-02-27 | Discharge: 2020-02-27 | Disposition: A | Discharge status: Home / Self Care | Payer: Medicare Other | Attending: Emergency Medicine | Admitting: Emergency Medicine

## 2020-02-27 ENCOUNTER — Encounter (HOSPITAL_COMMUNITY): Payer: Self-pay | Admitting: *Deleted

## 2020-02-27 ENCOUNTER — Emergency Department (HOSPITAL_COMMUNITY): Payer: Medicare Other

## 2020-02-27 ENCOUNTER — Other Ambulatory Visit: Payer: Self-pay

## 2020-02-27 DIAGNOSIS — Z7982 Long term (current) use of aspirin: Secondary | ICD-10-CM | POA: Diagnosis not present

## 2020-02-27 DIAGNOSIS — F17210 Nicotine dependence, cigarettes, uncomplicated: Secondary | ICD-10-CM | POA: Insufficient documentation

## 2020-02-27 DIAGNOSIS — B070 Plantar wart: Secondary | ICD-10-CM | POA: Diagnosis not present

## 2020-02-27 DIAGNOSIS — R0789 Other chest pain: Secondary | ICD-10-CM | POA: Diagnosis present

## 2020-02-27 DIAGNOSIS — R079 Chest pain, unspecified: Secondary | ICD-10-CM

## 2020-02-27 LAB — CBC
HCT: 43.3 % (ref 39.0–52.0)
Hemoglobin: 14.5 g/dL (ref 13.0–17.0)
MCH: 33.3 pg (ref 26.0–34.0)
MCHC: 33.5 g/dL (ref 30.0–36.0)
MCV: 99.5 fL (ref 80.0–100.0)
Platelets: 204 10*3/uL (ref 150–400)
RBC: 4.35 MIL/uL (ref 4.22–5.81)
RDW: 13.8 % (ref 11.5–15.5)
WBC: 5.5 10*3/uL (ref 4.0–10.5)
nRBC: 0 % (ref 0.0–0.2)

## 2020-02-27 LAB — BASIC METABOLIC PANEL
Anion gap: 11 (ref 5–15)
BUN: 12 mg/dL (ref 8–23)
CO2: 21 mmol/L — ABNORMAL LOW (ref 22–32)
Calcium: 8.9 mg/dL (ref 8.9–10.3)
Chloride: 104 mmol/L (ref 98–111)
Creatinine, Ser: 0.81 mg/dL (ref 0.61–1.24)
GFR calc Af Amer: >60 mL/min (ref >60)
GFR calc non Af Amer: >60 mL/min (ref >60)
Glucose, Bld: 78 mg/dL (ref 70–99)
Potassium: 4 mmol/L (ref 3.5–5.1)
Sodium: 136 mmol/L (ref 135–145)

## 2020-02-27 LAB — TROPONIN I (HIGH SENSITIVITY): Troponin I (High Sensitivity): 2 ng/L (ref <18)

## 2020-02-27 MED ORDER — SODIUM CHLORIDE 0.9% FLUSH
3.0000 mL | Freq: Once | INTRAVENOUS | Status: AC
  Administered 2020-02-27: 3 mL via INTRAVENOUS

## 2020-02-27 NOTE — ED Provider Notes (Signed)
[facility] EMERGENCY DEPARTMENT Provider Note   CSN: [id number] Arrival date & time: 02/27/20  1456     History Chief Complaint  Patient presents with  . Chest Pain    Kevin Dalton is a 64 y.o. male.  HPI   This patient is a 64 year old male, he has known neurofibromatosis, he does not take any medicine for high blood pressure diabetes or cholesterol, he does smoke cigarettes.  He denies any prior history of cardiac disease.  He reports that starting last night he started to develop some pain in the left side of his chest, this is intermittent, it seems to last for couple minutes then goes away.  It is not exertional, he does not have shortness of breath or nausea or diaphoresis with it and he has had no difficulty ambulating.  He reports that at this time the pain is coming and going, it is sharp and stabbing in nature, it is not associated with coughing or swelling of the legs.  He has never had a blood clot.  He has not been feeling poorly at all other than this chest pain which comes and goes.  On further investigation the patient actually says it has been going on for quite some time but only started becoming worried about last night.  Past Medical History:  Diagnosis Date  . Neurofibromatosis     Patient Active Problem List   Diagnosis Date Noted  . Other closed fractures of upper end of humerus 06/01/2011  . Pain in joint, shoulder region 06/01/2011  . Muscle weakness (generalized) 06/01/2011    History reviewed. No pertinent surgical history.     History reviewed. No pertinent family history.  Social History   Tobacco Use  . Smoking status: Current Some Day Smoker    Packs/day: 0.25    Types: Cigarettes  . Smokeless tobacco: Never Used  Substance Use Topics  . Alcohol use: Yes    Comment: occasionally  . Drug use: No    Home Medications Prior to Admission medications   Medication Sig Start Date End Date Taking? Authorizing Provider    Aspirin-Salicylamide-Caffeine (BC FAST PAIN RELIEF) [phone] MG PACK Take by mouth.      [provider]    Allergies    Patient has no known allergies.  Review of Systems   Review of Systems  All other systems reviewed and are negative.   Physical Exam Updated Vital Signs BP (!) 156/101   Pulse 89   Temp 98.4 F (36.9 C) (Oral)   Resp 18   Ht 1.842 m (6' 0.5")   Wt 65.8 kg   SpO2 98%   BMI 19.40 kg/m   Physical Exam Vitals and nursing note reviewed.  Constitutional:      General: He is not in acute distress.    Appearance: He is well-developed.  HENT:     Head: Normocephalic and atraumatic.     Mouth/Throat:     Pharynx: No oropharyngeal exudate.  Eyes:     General: No scleral icterus.       Right eye: No discharge.        Left eye: No discharge.     Conjunctiva/sclera: Conjunctivae normal.     Pupils: Pupils are equal, round, and reactive to light.  Neck:     Thyroid: No thyromegaly.     Vascular: No JVD.  Cardiovascular:     Rate and Rhythm: Normal rate and regular rhythm.     Heart sounds: Normal heart  sounds. No murmur heard.  No friction rub. No gallop.   Pulmonary:     Effort: Pulmonary effort is normal. No respiratory distress.     Breath sounds: Normal breath sounds. No wheezing or rales.  Abdominal:     General: Bowel sounds are normal. There is no distension.     Palpations: Abdomen is soft. There is no mass.     Tenderness: There is no abdominal tenderness.  Musculoskeletal:        General: No tenderness. Normal range of motion.     Cervical back: Normal range of motion and neck supple.  Lymphadenopathy:     Cervical: No cervical adenopathy.  Skin:    General: Skin is warm and dry.     Findings: No erythema or rash.     Comments: Multiple skin masses consistent with neurofibromatosis  Neurological:     Mental Status: He is alert.     Coordination: Coordination normal.  Psychiatric:        Behavior: Behavior normal.      ED Results / Procedures / Treatments   Labs (all labs ordered are listed, but only abnormal results are displayed) Labs Reviewed  BASIC METABOLIC PANEL - Abnormal; Notable for the following components:      Result Value   CO2 21 (*)    All other components within normal limits  CBC  TROPONIN I (HIGH SENSITIVITY)    EKG EKG Interpretation  Date/Time:  Wednesday February 27 2020 15:19:43 EDT Ventricular Rate:  92 PR Interval:    QRS Duration: 84 QT Interval:  350 QTC Calculation: 433 R Axis:   53 Text Interpretation: Sinus rhythm Probable left atrial enlargement RSR' in V1 or V2, probably normal variant No old tracing to compare Confirmed by Noemi Chapel [phone]) on 02/27/2020 3:37:47 PM   Radiology DG Chest Portable 1 View  Result Date: 02/27/2020 CLINICAL DATA:  LEFT-sided chest pain.  Smoker. EXAM: PORTABLE CHEST 1 VIEW COMPARISON:  06/18/2010 FINDINGS: Heart size is normal. Lungs are free of focal consolidations and pleural effusions. No pulmonary edema. UPPER thoracic scoliosis noted. Multiple soft tissue masses are consistent with history of neurofibromatosis BB. IMPRESSION: No evidence for acute cardiopulmonary abnormality. Electronically Signed   By: Nolon Nations M.D.   On: 02/27/2020 15:49    Procedures Procedures (including critical care time)  Medications Ordered in ED Medications  sodium chloride flush (NS) 0.9 % injection 3 mL (3 mLs Intravenous Given 02/27/20 1531)    ED Course  I have reviewed the triage vital signs and the nursing notes.  Pertinent labs & imaging results that were available during my care of the patient were reviewed by me and considered in my medical decision making (see chart for details).    MDM Rules/Calculators/A&P                          The patient's cardiac and pulmonary exams are totally unremarkable, his vital signs reflect some mild hypertension at 156/101, he is afebrile, not tachycardic and has an oxygen of 98%.  We  will order a chest x-ray and some lab work, he does have a low risk for cardiac disease but given his age, tobacco use, he will need to have at least 2 troponins and likely follow-up with cardiology.  The patient is agreeable to this.  We will look for pneumothorax with chest x-ray, he is low risk for pulmonary embolism and is not tachycardic or hypoxic.  He  is low risk for aortic dissection as well.  He does not have a classic presentation for either of the latter 2 diagnoses.  There is no murmur or rub or gallop to be consistent with pericarditis.  This patient is overall well-appearing, his laboratory work-up is totally normal, there is no abnormal leukocytosis anemia or electrolyte disturbance or elevation in troponin.  His chest x-ray is totally clear without signs of pneumothorax, pneumomediastinum, abnormal appearing mediastinum, or any other abnormalities.  His EKG and troponin are unremarkable suggestive that this is not related to an acute coronary syndrome.  He is not having constant chest pain in fact it has a very atypical sound with sharp left-sided chest pain lasting 1 or 2 minutes and nonexertional.  Given that this is been going on for quite some time I think he can follow-up in the outpatient setting with cardiologist, the patient is agreeable  Final Clinical Impression(s) / ED Diagnoses Final diagnoses:  Left-sided chest pain  Plantar wart    Rx / DC Orders ED Discharge Orders    None       Noemi Chapel, MD 02/27/20 [phone]

## 2020-02-27 NOTE — ED Triage Notes (Signed)
Pt with leftsided cp since last night off and on, denies at present.  Denies sob, N/V, or diaphoresis.Marland Kitchen

## 2020-02-27 NOTE — Discharge Instructions (Signed)
Please take a baby aspirin daily, follow-up with the heart doctor in the office within 3 or 4 days.  I have given you their phone number above.  Your testing today shows no signs of heart attack which is reassuring, the cause of your chest pain is not exactly clear but you do have some risk because that you are a smoker.  I would encourage you to stop smoking immediately, return to the emergency department for severe or worsening symptoms or any other concerning symptoms

## 2020-04-01 ENCOUNTER — Other Ambulatory Visit: Payer: Self-pay

## 2020-04-01 ENCOUNTER — Encounter: Payer: Self-pay | Admitting: Podiatrist

## 2020-04-01 ENCOUNTER — Ambulatory Visit (INDEPENDENT_AMBULATORY_CARE_PROVIDER_SITE_OTHER): Payer: Medicare Other | Admitting: Podiatrist

## 2020-04-01 DIAGNOSIS — L84 Corns and callosities: Secondary | ICD-10-CM | POA: Diagnosis not present

## 2020-04-01 NOTE — Patient Instructions (Signed)

## 2020-04-01 NOTE — Progress Notes (Signed)
Fibromatosis.  Callus medial side left heel with poro central   Chief Complaint  Patient presents with  . Callouses    Painful callus - bottom of R heel. Unknown duration. Pt was unable to rate his pain by using the pain scale. He does not recall stepping on anything.     HPI: Patient is 64 y.o. male who presents today for a painful callus on the bottom of his left heel.  He relates is has been giving him pain for a few weeks now and especially when walking on the foot.  No self treatment has been tried.    Patient Active Problem List   Diagnosis Date Noted  . Other closed fractures of upper end of humerus 06/01/2011  . Pain in joint, shoulder region 06/01/2011  . Muscle weakness (generalized) 06/01/2011    Current Outpatient Medications on File Prior to Visit  Medication Sig Dispense Refill  . Aspirin-Salicylamide-Caffeine (BC FAST PAIN RELIEF) 650-195-33.3 MG PACK Take by mouth.   (Patient not taking: Reported on 04/01/2020)     No current facility-administered medications on file prior to visit.    No Known Allergies  Review of Systems No fevers, chills, nausea, muscle aches, no difficulty breathing, no calf pain, no chest pain or shortness of breath.   Physical Exam  GENERAL APPEARANCE: Alert, conversant. Appropriately groomed. No acute distress.   VASCULAR: Pedal pulses palpable DP and PT bilateral.  Capillary refill time is immediate to all digits,  Proximal to distal cooling it warm to warm.  Digital perfusion adequate.   NEUROLOGIC: sensation is intact epicritically and protectively to 5.07 monofilament at 5/5 sites bilateral.  Light touch is intact bilateral, vibratory sensation intact bilateral, achilles tendon reflex is intact bilateral.   MUSCULOSKELETAL: acceptable muscle strength, tone and stability bilateral.  No gross boney pedal deformities noted.  No pain, crepitus or limitation noted with foot and ankle range of motion bilateral.   DERMATOLOGIC: skin is  warm, supple, and dry. Multiple soft tissue lesions noted that appear to be similar to those of neurofibromatosis are present on the feet- these are non tender and non symptomatic.  Hyperkeratotic lesion is present on the plantar medial heel of the right foot.  Central core is present which once debrided reveals intact skin underlying.     Assessment     ICD-10-CM   1. Callus of heel  L84      Plan  Pared the lesion with a 15 blade without complication and removed the central core.  Applied salinocaine and a pad.   Recommended using a pumice stone to keep the callus at bay and to return if it gets large and needs to be trimmed back again.  He will be seen back prn.

## 2020-10-28 DIAGNOSIS — R030 Elevated blood-pressure reading, without diagnosis of hypertension: Secondary | ICD-10-CM | POA: Diagnosis not present

## 2020-10-28 DIAGNOSIS — R69 Illness, unspecified: Secondary | ICD-10-CM | POA: Diagnosis not present

## 2020-10-28 DIAGNOSIS — Z833 Family history of diabetes mellitus: Secondary | ICD-10-CM | POA: Diagnosis not present

## 2020-11-03 DIAGNOSIS — Z681 Body mass index (BMI) 19 or less, adult: Secondary | ICD-10-CM | POA: Diagnosis not present

## 2020-11-03 DIAGNOSIS — Q8501 Neurofibromatosis, type 1: Secondary | ICD-10-CM | POA: Diagnosis not present

## 2020-11-03 DIAGNOSIS — J449 Chronic obstructive pulmonary disease, unspecified: Secondary | ICD-10-CM | POA: Diagnosis not present

## 2020-11-03 DIAGNOSIS — I10 Essential (primary) hypertension: Secondary | ICD-10-CM | POA: Diagnosis not present

## 2020-11-03 DIAGNOSIS — Z125 Encounter for screening for malignant neoplasm of prostate: Secondary | ICD-10-CM | POA: Diagnosis not present

## 2020-11-03 DIAGNOSIS — M160 Bilateral primary osteoarthritis of hip: Secondary | ICD-10-CM | POA: Diagnosis not present

## 2020-11-10 ENCOUNTER — Encounter: Payer: Self-pay | Admitting: Internal Medicine

## 2020-12-01 ENCOUNTER — Ambulatory Visit (INDEPENDENT_AMBULATORY_CARE_PROVIDER_SITE_OTHER): Payer: Self-pay | Admitting: *Deleted

## 2020-12-01 ENCOUNTER — Other Ambulatory Visit: Payer: Self-pay

## 2020-12-01 VITALS — Ht 65.5 in | Wt 127.6 lb

## 2020-12-01 DIAGNOSIS — Z1211 Encounter for screening for malignant neoplasm of colon: Secondary | ICD-10-CM

## 2020-12-01 MED ORDER — PEG 3350-KCL-NA BICARB-NACL 420 G PO SOLR
4000.0000 mL | Freq: Once | ORAL | 0 refills | Status: AC
Start: 2020-12-01 — End: 2020-12-01

## 2020-12-01 NOTE — Progress Notes (Signed)
Gastroenterology Pre-Procedure Review  Request Date: 12/01/2020 Requesting Physician: Dr. Sherrie Sport, no previous TCS, family hx of colon cancer (brother)  PATIENT REVIEW QUESTIONS: The patient responded to the following health history questions as indicated:    1. Diabetes Melitis: no 2. Joint replacements in the past 12 months: no 3. Major health problems in the past 3 months: no 4. Has an artificial valve or MVP: no 5. Has a defibrillator: no 6. Has been advised in past to take antibiotics in advance of a procedure like teeth cleaning: no 7. Family history of colon cancer: yes, brother: age 2's  8. Alcohol Use: yes, 16 oz beer a week 9. Illicit drug Use: no 10. History of sleep apnea: no  11. History of coronary artery or other vascular stents placed within the last 12 months: no 12. History of any prior anesthesia complications: no 13. Body mass index is 20.91 kg/m.    MEDICATIONS & ALLERGIES:    Patient reports the following regarding taking any blood thinners:   Plavix? no Aspirin? yes, 81 mg Coumadin? no Brilinta? no Xarelto? no Eliquis? no Pradaxa? no Savaysa? no Effient? no  Patient confirms/reports the following medications:  Current Outpatient Medications  Medication Sig Dispense Refill  . acetaminophen (TYLENOL) 500 MG tablet Take 500 mg by mouth as needed. Takes 2 tablets as needed.    Marland Kitchen amLODipine (NORVASC) 5 MG tablet Take 5 mg by mouth daily.    Marland Kitchen aspirin EC 81 MG tablet Take 81 mg by mouth daily. Swallow whole.     No current facility-administered medications for this visit.    Patient confirms/reports the following allergies:  No Known Allergies  No orders of the defined types were placed in this encounter.   AUTHORIZATION INFORMATION Primary Insurance: Bernadene Person,  ID #: [id number],   Pre-Cert / Auth required: No, not required  SCHEDULE INFORMATION: Procedure has been scheduled as follows:  Date: 01/23/2021, Time: 12:00 PM Location: [facility]  with Dr. Abbey Chatters  This Gastroenterology Pre-Precedure Review Form is being routed to the following provider(s): Walden Field, NP

## 2020-12-01 NOTE — Patient Instructions (Signed)
Covid test: 01/21/2021 at 3:00 PM.   Kevin Dalton   [birth year] MRN: [id number]    Procedure Date: 01/23/2021 Time to register: 10:30 AM Place to register: [facility] Scheduled provider: Dr. Abbey Chatters  PREPARATION FOR COLONOSCOPY WITH TRI-LYTE SPLIT PREP  Please notify us immediately if you are diabetic, take iron supplements, or if you are on Coumadin or any other blood thinners.   Please hold the following medications: n/a  You will need to purchase 1 fleet enema and 1 box of Bisacodyl 60m tablets.   2 DAYS BEFORE PROCEDURE:  DATE: 01/21/2021   DAY: Wednesday Begin clear liquid diet AFTER your lunch meal. NO SOLID FOODS after this point.  1 DAY BEFORE PROCEDURE:  DATE: 01/22/2021   DAY: Thursday Continue clear liquids the entire day - NO SOLID FOOD.   Diabetic medications adjustments for today: n/a  At 2:00 pm:  Take 2 Bisacodyl tablets.   At 4:00pm:  Start drinking your solution. Make sure you mix well per instructions on the bottle. Try to drink 1 (one) 8 ounce glass every 10-15 minutes until you have consumed HALF the jug. You should complete by 6:00pm.You must keep the left over solution refrigerated until completed next day.  Continue clear liquids. You must drink plenty of clear liquids to prevent dehyration and kidney failure.     DAY OF PROCEDURE:   DATE: 01/23/2021  DAY: Friday If you take medications for your heart, blood pressure or breathing, you may take these medications.  Diabetic medications adjustments for today: n/a  Five hours before your procedure time @ 7:00 am:  Finish remaining amout of bowel prep, drinking 1 (one) 8 ounce glass every 10-15 minutes until complete. You have two hours to consume remaining prep.   Three hours before your procedure time @ 9:00 am:  Nothing by mouth.   At least one hour before going to the hospital:  Give yourself one Fleet enema. You may take your morning medications with sip of water unless we have instructed  otherwise.      Please see below for Dietary Information.  CLEAR LIQUIDS INCLUDE:  Water Jello (NOT red in color)   Ice Popsicles (NOT red in color)   Tea (sugar ok, no milk/cream) Powdered fruit flavored drinks  Coffee (sugar ok, no milk/cream) Gatorade/ Lemonade/ Kool-Aid  (NOT red in color)   Juice: apple, white grape, white cranberry Soft drinks  Clear bullion, consomme, broth (fat free beef/chicken/vegetable)  Carbonated beverages (any kind)  Strained chicken noodle soup Hard Candy   Remember: Clear liquids are liquids that will allow you to see your fingers on the other side of a clear glass. Be sure liquids are NOT red in color, and not cloudy, but CLEAR.  DO NOT EAT OR DRINK ANY OF THE FOLLOWING:  Dairy products of any kind   Cranberry juice Tomato juice / V8 juice   Grapefruit juice Orange juice     Red grape juice  Do not eat any solid foods, including such foods as: cereal, oatmeal, yogurt, fruits, vegetables, creamed soups, eggs, bread, crackers, pureed foods in a blender, etc.   HELPFUL HINTS FOR DRINKING PREP SOLUTION:   Make sure prep is extremely cold. Mix and refrigerate the the morning of the prep. You may also put in the freezer.   You may try mixing some Crystal Light or Country Time Lemonade if you prefer. Mix in small amounts; add more if necessary.  Try drinking through a straw  Rinse  mouth with water or a mouthwash between glasses, to remove after-taste.  Try sipping on a cold beverage /ice/ popsicles between glasses of prep.  Place a piece of sugar-free hard candy in mouth between glasses.  If you become nauseated, try consuming smaller amounts, or stretch out the time between glasses. Stop for 30-60 minutes, then slowly start back drinking.        OTHER INSTRUCTIONS  You will need a responsible adult at least 64 years of age to accompany you and drive you home. This person must remain in the waiting room during your procedure. The hospital  will cancel your procedure if you do not have a responsible adult with you.   1. Wear loose fitting clothing that is easily removed. 2. Leave jewelry and other valuables at home.  3. Remove all body piercing jewelry and leave at home. 4. Total time from sign-in until discharge is approximately 2-3 hours. 5. You should go home directly after your procedure and rest. You can resume normal activities the day after your procedure. 6. The day of your procedure you should not:  Drive  Make legal decisions  Operate machinery  Drink alcohol  Return to work   You may call the office (Dept: [phone]) before 5:00pm, or page the doctor on call [phone]) after 5:00pm, for further instructions, if necessary.   Insurance Information YOU WILL NEED TO CHECK WITH YOUR INSURANCE COMPANY FOR THE BENEFITS OF COVERAGE YOU HAVE FOR THIS PROCEDURE.  UNFORTUNATELY, NOT ALL INSURANCE COMPANIES HAVE BENEFITS TO COVER ALL OR PART OF THESE TYPES OF PROCEDURES.  IT IS YOUR RESPONSIBILITY TO CHECK YOUR BENEFITS, HOWEVER, WE WILL BE GLAD TO ASSIST YOU WITH ANY CODES YOUR INSURANCE COMPANY MAY NEED.    PLEASE NOTE THAT MOST INSURANCE COMPANIES WILL NOT COVER A SCREENING COLONOSCOPY FOR PEOPLE UNDER THE AGE OF 50  IF YOU HAVE BCBS INSURANCE, YOU MAY HAVE BENEFITS FOR A SCREENING COLONOSCOPY BUT IF POLYPS ARE FOUND THE DIAGNOSIS WILL CHANGE AND THEN YOU MAY HAVE A DEDUCTIBLE THAT WILL NEED TO BE MET. SO PLEASE MAKE SURE YOU CHECK YOUR BENEFITS FOR A SCREENING COLONOSCOPY AS WELL AS A DIAGNOSTIC COLONOSCOPY.

## 2020-12-03 NOTE — Progress Notes (Signed)
Ok to schedule.  ASA I/II (based on limited history available in Epic) 

## 2021-01-21 ENCOUNTER — Other Ambulatory Visit (HOSPITAL_COMMUNITY): Payer: Medicare HMO

## 2021-01-23 ENCOUNTER — Encounter (HOSPITAL_COMMUNITY)
Admission: RE | Disposition: A | Discharge status: Home / Self Care | Payer: Self-pay | Source: Home / Self Care | Attending: Internal Medicine

## 2021-01-23 ENCOUNTER — Ambulatory Visit (HOSPITAL_COMMUNITY): Payer: Medicare HMO | Admitting: Certified Registered Nurse Anesthetist

## 2021-01-23 ENCOUNTER — Encounter (HOSPITAL_COMMUNITY): Payer: Self-pay

## 2021-01-23 ENCOUNTER — Other Ambulatory Visit: Payer: Self-pay

## 2021-01-23 ENCOUNTER — Ambulatory Visit (HOSPITAL_COMMUNITY)
Admission: RE | Admit: 2021-01-23 | Discharge: 2021-01-23 | Disposition: A | Discharge status: Home / Self Care | Payer: Medicare HMO | Attending: Internal Medicine | Admitting: Internal Medicine

## 2021-01-23 DIAGNOSIS — Z800 Family history of malignant neoplasm of digestive organs: Secondary | ICD-10-CM | POA: Diagnosis not present

## 2021-01-23 DIAGNOSIS — K635 Polyp of colon: Secondary | ICD-10-CM

## 2021-01-23 DIAGNOSIS — D124 Benign neoplasm of descending colon: Secondary | ICD-10-CM | POA: Insufficient documentation

## 2021-01-23 DIAGNOSIS — Z79899 Other long term (current) drug therapy: Secondary | ICD-10-CM | POA: Insufficient documentation

## 2021-01-23 DIAGNOSIS — D128 Benign neoplasm of rectum: Secondary | ICD-10-CM | POA: Insufficient documentation

## 2021-01-23 DIAGNOSIS — F17210 Nicotine dependence, cigarettes, uncomplicated: Secondary | ICD-10-CM | POA: Diagnosis not present

## 2021-01-23 DIAGNOSIS — K648 Other hemorrhoids: Secondary | ICD-10-CM | POA: Insufficient documentation

## 2021-01-23 DIAGNOSIS — R69 Illness, unspecified: Secondary | ICD-10-CM | POA: Diagnosis not present

## 2021-01-23 DIAGNOSIS — K621 Rectal polyp: Secondary | ICD-10-CM | POA: Diagnosis not present

## 2021-01-23 DIAGNOSIS — Z1211 Encounter for screening for malignant neoplasm of colon: Secondary | ICD-10-CM | POA: Diagnosis not present

## 2021-01-23 HISTORY — DX: Essential (primary) hypertension: I10

## 2021-01-23 HISTORY — PX: COLONOSCOPY WITH PROPOFOL: SHX5780

## 2021-01-23 HISTORY — PX: POLYPECTOMY: SHX5525

## 2021-01-23 SURGERY — COLONOSCOPY WITH PROPOFOL
Anesthesia: General

## 2021-01-23 MED ORDER — STERILE WATER FOR IRRIGATION IR SOLN
Status: DC | PRN
  Administered 2021-01-23: 1.53 mL

## 2021-01-23 MED ORDER — PROPOFOL 10 MG/ML IV BOLUS
INTRAVENOUS | Status: DC | PRN
  Administered 2021-01-23: 100 mg via INTRAVENOUS

## 2021-01-23 MED ORDER — LACTATED RINGERS IV SOLN: INTRAVENOUS | Status: DC

## 2021-01-23 MED ORDER — PROPOFOL 500 MG/50ML IV EMUL
INTRAVENOUS | Status: DC | PRN
  Administered 2021-01-23: 150 ug/kg/min via INTRAVENOUS

## 2021-01-23 NOTE — Anesthesia Preprocedure Evaluation (Signed)
Anesthesia Evaluation  Patient identified by MRN, date of birth, ID band Patient awake    Reviewed: Allergy & Precautions, H&P , NPO status , Patient's Chart, lab work & pertinent test results, reviewed documented beta blocker date and time   Airway Mallampati: II  TM Distance: >3 FB Neck ROM: full    Dental no notable dental hx. (+) Teeth Intact   Pulmonary neg pulmonary ROS, Current Smoker,    Pulmonary exam normal breath sounds clear to auscultation       Cardiovascular Exercise Tolerance: Good hypertension, negative cardio ROS   Rhythm:regular Rate:Normal     Neuro/Psych negative neurological ROS  negative psych ROS   GI/Hepatic negative GI ROS, Neg liver ROS,   Endo/Other  negative endocrine ROS  Renal/GU negative Renal ROS  negative genitourinary   Musculoskeletal   Abdominal   Peds  Hematology negative hematology ROS (+)   Anesthesia Other Findings   Reproductive/Obstetrics negative OB ROS                             Anesthesia Physical Anesthesia Plan  ASA: 2  Anesthesia Plan: General   Post-op Pain Management:    Induction:   PONV Risk Score and Plan: Propofol infusion  Airway Management Planned:   Additional Equipment:   Intra-op Plan:   Post-operative Plan:   Informed Consent: I have reviewed the patients History and Physical, chart, labs and discussed the procedure including the risks, benefits and alternatives for the proposed anesthesia with the patient or authorized representative who has indicated his/her understanding and acceptance.     Dental Advisory Given  Plan Discussed with: CRNA  Anesthesia Plan Comments:         Anesthesia Quick Evaluation

## 2021-01-23 NOTE — Anesthesia Postprocedure Evaluation (Signed)
Anesthesia Post Note  Patient: Kevin Dalton  Procedure(s) Performed: COLONOSCOPY WITH PROPOFOL POLYPECTOMY  Patient location during evaluation: Phase II Anesthesia Type: General Level of consciousness: awake Pain management: pain level controlled Vital Signs Assessment: post-procedure vital signs reviewed and stable Respiratory status: spontaneous breathing and respiratory function stable Cardiovascular status: blood pressure returned to baseline and stable Postop Assessment: no headache and no apparent nausea or vomiting Anesthetic complications: no Comments: Late entry   No notable events documented.   Last Vitals:  Vitals:   01/23/21 1228 01/23/21 1231  BP:  103/74  Pulse: 74   Resp: (!) 22 18  Temp: (!) 36.4 C   SpO2:  100%    Last Pain:  Vitals:   01/23/21 1231  TempSrc:   PainSc: 0-No pain                 Louann Sjogren

## 2021-01-23 NOTE — Discharge Instructions (Addendum)
  Colonoscopy Discharge Instructions  Read the instructions outlined below and refer to this sheet in the next few weeks. These discharge instructions provide you with general information on caring for yourself after you leave the hospital. Your doctor may also give you specific instructions. While your treatment has been planned according to the most current medical practices available, unavoidable complications occasionally occur.   ACTIVITY You may resume your regular activity, but move at a slower pace for the next 24 hours.  Take frequent rest periods for the next 24 hours.  Walking will help get rid of the air and reduce the bloated feeling in your belly (abdomen).  No driving for 24 hours (because of the medicine (anesthesia) used during the test).   Do not sign any important legal documents or operate any machinery for 24 hours (because of the anesthesia used during the test).  NUTRITION Drink plenty of fluids.  You may resume your normal diet as instructed by your doctor.  Begin with a light meal and progress to your normal diet. Heavy or fried foods are harder to digest and may make you feel sick to your stomach (nauseated).  Avoid alcoholic beverages for 24 hours or as instructed.  MEDICATIONS You may resume your normal medications unless your doctor tells you otherwise.  WHAT YOU CAN EXPECT TODAY Some feelings of bloating in the abdomen.  Passage of more gas than usual.  Spotting of blood in your stool or on the toilet paper.  IF YOU HAD POLYPS REMOVED DURING THE COLONOSCOPY: No aspirin products for 7 days or as instructed.  No alcohol for 7 days or as instructed.  Eat a soft diet for the next 24 hours.  FINDING OUT THE RESULTS OF YOUR TEST Not all test results are available during your visit. If your test results are not back during the visit, make an appointment with your caregiver to find out the results. Do not assume everything is normal if you have not heard from your  caregiver or the medical facility. It is important for you to follow up on all of your test results.  SEEK IMMEDIATE MEDICAL ATTENTION IF: You have more than a spotting of blood in your stool.  Your belly is swollen (abdominal distention).  You are nauseated or vomiting.  You have a temperature over 101.  You have abdominal pain or discomfort that is severe or gets worse throughout the day.   Your colonoscopy revealed 3 polyp(s) which I removed successfully. One of these polyps was quite large requiring endoscopic resection.  To prevent bleeding postoperatively, I also placed 3 metallic clips.  If you need an MRI in the next 6 to 8 weeks, you will need to have an x-ray prior.  These will eventually fall off by themselves.  Await pathology results, my office will contact you.  Given the large polyp removed, I recommend repeating in 6 months to ensure no further polyp tissue remains.   I hope you have a great rest of your week!  Elon Alas. Abbey Chatters, D.O. Gastroenterology and Hepatology [facility]

## 2021-01-23 NOTE — Op Note (Signed)
[facility] Patient Name: Kevin Dalton Procedure Date: 01/23/2021 11:35 AM MRN: [id number] Date of Birth: [birth year] Attending MD: Elon Alas. Abbey Chatters DO CSN: [id number] Age: 65 Admit Type: Outpatient Procedure:                Colonoscopy Indications:              Screening for colorectal malignant neoplasm Providers:                Elon Alas. Abbey Chatters, DO, Charlsie Quest. Theda Sers RN, RN,                            Aram Candela Referring MD:              Medicines:                See the Anesthesia note for documentation of the                            administered medications Complications:            No immediate complications. Estimated Blood Loss:     Estimated blood loss was minimal. Procedure:                Pre-Anesthesia Assessment:                           - The anesthesia plan was to use monitored                            anesthesia care (MAC).                           After obtaining informed consent, the colonoscope                            was passed under direct vision. Throughout the                            procedure, the patient's blood pressure, pulse, and                            oxygen saturations were monitored continuously. The                            [id number]-[id number] ([id number]) scope was introduced through                            the anus and advanced to the the cecum, identified                            by appendiceal orifice and ileocecal valve. The                            colonoscopy was performed without difficulty. The                            patient tolerated the procedure well.  The quality                            of the bowel preparation was evaluated using the                            BBPS [facility] Bowel Preparation Scale) with scores                            of: Right Colon = 3, Transverse Colon = 3 and Left                            Colon = 3 (entire mucosa seen well with no residual                            staining, small  fragments of stool or opaque                            liquid). The total BBPS score equals 9. Scope In: 11:51:03 AM Scope Out: 12:24:44 PM Scope Withdrawal Time: 0 hours 27 minutes 3 seconds  Total Procedure Duration: 0 hours 33 minutes 41 seconds  Findings:      Non-bleeding internal hemorrhoids were found during retroflexion.      A 10 mm polyp was found in the rectum. The polyp was semi-pedunculated.       The polyp was removed with a hot snare. Resection and retrieval were       complete.      A 12 mm polyp was found in the descending colon. The polyp was sessile.       The polyp was removed with a hot snare. Resection was complete, but the       polyp tissue was not retrieved.      A 22 mm polyp was found in the descending colon. The polyp was       semi-sessile. Preparations were made for mucosal resection. Orise gel       was injected with adequate lift of the lesion from the muscularis       propria. Margins were well demarcated Snare mucosal resection with Jabier Mutton       net retrieval was performed. A 25 mm area was resected. Resection and       retrieval were complete. To close a defect after mucosal resection,       three hemostatic clips were successfully placed (MR conditional). There       was no bleeding at the end of the procedure. Impression:               - Non-bleeding internal hemorrhoids.                           - One 10 mm polyp in the rectum, removed with a hot                            snare. Resected and retrieved.                           - One 12 mm polyp in the descending  colon, removed                            with a hot snare. Complete resection. Polyp tissue                            not retrieved.                           - One 22 mm polyp in the descending colon, removed                            with mucosal resection. Resected and retrieved.                            Clips (MR conditional) were placed.                           - Mucosal  resection was performed. Resection and                            retrieval were complete. Moderate Sedation:      Per Anesthesia Care Recommendation:           - Patient has a contact number available for                            emergencies. The signs and symptoms of potential                            delayed complications were discussed with the                            patient. Return to normal activities tomorrow.                            Written discharge instructions were provided to the                            patient.                           - Resume previous diet.                           - Continue present medications.                           - Await pathology results.                           - Repeat colonoscopy in 6 months for surveillance.                           - Return to GI clinic PRN.                           -  Need KUB prior to any MRI. Procedure Code(s):        --- Professional ---                           [phone], Colonoscopy, flexible; with endoscopic                            mucosal resection                           [id number], 57, Colonoscopy, flexible; with removal of                            tumor(s), polyp(s), or other lesion(s) by snare                            technique Diagnosis Code(s):        --- Professional ---                           Z12.11, Encounter for screening for malignant                            neoplasm of colon                           K62.1, Rectal polyp                           K63.5, Polyp of colon                           K64.8, Other hemorrhoids CPT copyright 2019 American Medical Association. All rights reserved. The codes documented in this report are preliminary and upon coder review may  be revised to meet current compliance requirements. Elon Alas. Abbey Chatters, DO [place] Abbey Chatters, DO 01/23/2021 12:31:34 PM This report has been signed electronically. Number of Addenda: 0

## 2021-01-23 NOTE — H&P (Signed)
Primary Care Physician:  Neale Burly, MD Primary Gastroenterologist:  Dr. Abbey Chatters  Pre-Procedure History & Physical: HPI:  Kevin Dalton is a 65 y.o. male is here  for a colonoscopy to be performed for high risk colon cancer screening purposes due to family history of colon cancer in brother. Patient denies  melena or hematochezia.  No abdominal pain or unintentional weight loss.  No change in bowel habits.  Overall feels well from a GI standpoint.  Past Medical History:  Diagnosis Date   Hypertension    Neurofibromatosis     Past Surgical History:  Procedure Laterality Date   NO PAST SURGERIES      Prior to Admission medications   Medication Sig Start Date End Date Taking? Authorizing Provider  acetaminophen (TYLENOL) 500 MG tablet Take 1,000 mg by mouth every 8 (eight) hours as needed for moderate pain. Takes 2 tablets as needed.   Yes [provider]  amLODipine (NORVASC) 5 MG tablet Take 5 mg by mouth daily.   Yes [provider]  aspirin EC 81 MG tablet Take 81 mg by mouth daily as needed for moderate pain. Swallow whole.   Yes [provider]    Allergies as of 12/03/2020   (No Known Allergies)    History reviewed. No pertinent family history.  Social History   Socioeconomic History   Marital status: Single    Spouse name: Not on file   Number of children: Not on file   Years of education: Not on file   Highest education level: Not on file  Occupational History   Not on file  Tobacco Use   Smoking status: Some Days    Packs/day: 0.25    Pack years: 0.00    Types: Cigarettes   Smokeless tobacco: Never  Vaping Use   Vaping Use: Never used  Substance and Sexual Activity   Alcohol use: Yes    Comment: occasionally   Drug use: No   Sexual activity: Not on file  Other Topics Concern   Not on file  Social History Narrative   Not on file   Social Determinants of Health   Financial Resource Strain: Not on file  Food Insecurity:  Not on file  Transportation Needs: Not on file  Physical Activity: Not on file  Stress: Not on file  Social Connections: Not on file  Intimate Partner Violence: Not on file    Review of Systems: See HPI, otherwise negative ROS  Physical Exam: Vital signs in last 24 hours: Temp:  [97.6 F (36.4 C)] 97.6 F (36.4 C) (06/17 1101) Pulse Rate:  [86] 86 (06/17 1101) Resp:  [18] 18 (06/17 1101) BP: (146)/(88) 146/88 (06/17 1101) SpO2:  [99 %] 99 % (06/17 1101)   General:   Alert,  Well-developed, well-nourished, pleasant and cooperative in NAD Head:  Normocephalic and atraumatic. Eyes:  Sclera clear, no icterus.   Conjunctiva pink. Ears:  Normal auditory acuity. Nose:  No deformity, discharge,  or lesions. Mouth:  No deformity or lesions, dentition normal. Neck:  Supple; no masses or thyromegaly. Lungs:  Clear throughout to auscultation.   No wheezes, crackles, or rhonchi. No acute distress. Heart:  Regular rate and rhythm; no murmurs, clicks, rubs,  or gallops. Abdomen:  Soft, nontender and nondistended. No masses, hepatosplenomegaly or hernias noted. Normal bowel sounds, without guarding, and without rebound.   Msk:  Symmetrical without gross deformities. Normal posture. Extremities:  Without clubbing or edema. Neurologic:  Alert and  oriented x4;  grossly normal neurologically. Skin:  Intact without significant lesions or rashes. Cervical Nodes:  No significant cervical adenopathy. Psych:  Alert and cooperative. Normal mood and affect.  Impression/Plan: [name] is here for a colonoscopy to be performed for high risk colon cancer screening purposes due to family history of colon cancer in brother.   The risks of the procedure including infection, bleed, or perforation as well as benefits, limitations, alternatives and imponderables have been reviewed with the patient. Questions have been answered. All parties agreeable.

## 2021-01-23 NOTE — Transfer of Care (Signed)
Immediate Anesthesia Transfer of Care Note  Patient: Kevin Dalton  Procedure(s) Performed: COLONOSCOPY WITH PROPOFOL POLYPECTOMY  Patient Location: Endoscopy Unit  Anesthesia Type:General  Level of Consciousness: awake and alert   Airway & Oxygen Therapy: Patient Spontanous Breathing  Post-op Assessment: Report given to RN and Post -op Vital signs reviewed and stable  Post vital signs: Reviewed and stable  Last Vitals:  Vitals Value Taken Time  BP    Temp    Pulse    Resp    SpO2      Last Pain:  Vitals:   01/23/21 1155  TempSrc:   PainSc: 0-No pain         Complications: No notable events documented.

## 2021-01-26 LAB — SURGICAL PATHOLOGY

## 2021-01-29 ENCOUNTER — Encounter: Payer: Self-pay | Admitting: *Deleted

## 2021-01-29 ENCOUNTER — Encounter (HOSPITAL_COMMUNITY): Payer: Self-pay | Admitting: Internal Medicine

## 2021-02-03 DIAGNOSIS — M19042 Primary osteoarthritis, left hand: Secondary | ICD-10-CM | POA: Diagnosis not present

## 2021-02-03 DIAGNOSIS — J449 Chronic obstructive pulmonary disease, unspecified: Secondary | ICD-10-CM | POA: Diagnosis not present

## 2021-02-03 DIAGNOSIS — Q8501 Neurofibromatosis, type 1: Secondary | ICD-10-CM | POA: Diagnosis not present

## 2021-02-03 DIAGNOSIS — Z681 Body mass index (BMI) 19 or less, adult: Secondary | ICD-10-CM | POA: Diagnosis not present

## 2021-02-03 DIAGNOSIS — M160 Bilateral primary osteoarthritis of hip: Secondary | ICD-10-CM | POA: Diagnosis not present

## 2021-02-03 DIAGNOSIS — I10 Essential (primary) hypertension: Secondary | ICD-10-CM | POA: Diagnosis not present

## 2021-04-07 DIAGNOSIS — M25561 Pain in right knee: Secondary | ICD-10-CM | POA: Diagnosis not present

## 2021-05-05 DIAGNOSIS — M19042 Primary osteoarthritis, left hand: Secondary | ICD-10-CM | POA: Diagnosis not present

## 2021-05-05 DIAGNOSIS — Q8501 Neurofibromatosis, type 1: Secondary | ICD-10-CM | POA: Diagnosis not present

## 2021-05-05 DIAGNOSIS — M160 Bilateral primary osteoarthritis of hip: Secondary | ICD-10-CM | POA: Diagnosis not present

## 2021-05-05 DIAGNOSIS — M25561 Pain in right knee: Secondary | ICD-10-CM | POA: Diagnosis not present

## 2021-05-05 DIAGNOSIS — J449 Chronic obstructive pulmonary disease, unspecified: Secondary | ICD-10-CM | POA: Diagnosis not present

## 2021-05-05 DIAGNOSIS — I10 Essential (primary) hypertension: Secondary | ICD-10-CM | POA: Diagnosis not present

## 2021-05-05 DIAGNOSIS — Z0000 Encounter for general adult medical examination without abnormal findings: Secondary | ICD-10-CM | POA: Diagnosis not present

## 2021-05-05 DIAGNOSIS — Z681 Body mass index (BMI) 19 or less, adult: Secondary | ICD-10-CM | POA: Diagnosis not present

## 2021-05-08 DIAGNOSIS — I10 Essential (primary) hypertension: Secondary | ICD-10-CM | POA: Diagnosis not present

## 2021-05-08 DIAGNOSIS — M19042 Primary osteoarthritis, left hand: Secondary | ICD-10-CM | POA: Diagnosis not present

## 2021-05-08 DIAGNOSIS — Q8501 Neurofibromatosis, type 1: Secondary | ICD-10-CM | POA: Diagnosis not present

## 2021-05-08 DIAGNOSIS — J449 Chronic obstructive pulmonary disease, unspecified: Secondary | ICD-10-CM | POA: Diagnosis not present

## 2021-05-08 DIAGNOSIS — M25561 Pain in right knee: Secondary | ICD-10-CM | POA: Diagnosis not present

## 2021-05-08 DIAGNOSIS — Z0000 Encounter for general adult medical examination without abnormal findings: Secondary | ICD-10-CM | POA: Diagnosis not present

## 2021-05-08 DIAGNOSIS — M160 Bilateral primary osteoarthritis of hip: Secondary | ICD-10-CM | POA: Diagnosis not present

## 2021-05-13 DIAGNOSIS — R0789 Other chest pain: Secondary | ICD-10-CM | POA: Diagnosis not present

## 2021-06-02 DIAGNOSIS — L02415 Cutaneous abscess of right lower limb: Secondary | ICD-10-CM | POA: Diagnosis not present

## 2021-06-02 DIAGNOSIS — Z681 Body mass index (BMI) 19 or less, adult: Secondary | ICD-10-CM | POA: Diagnosis not present

## 2021-07-16 ENCOUNTER — Encounter: Payer: Self-pay | Admitting: *Deleted

## 2021-07-22 ENCOUNTER — Telehealth: Payer: Self-pay | Admitting: Internal Medicine

## 2021-07-22 NOTE — Telephone Encounter (Signed)
Pt needed assistance understanding what PCP and Subscriber ID meant on form.  Answered accordingly and pt understood.

## 2021-07-22 NOTE — Telephone Encounter (Signed)
Patient returned call

## 2021-07-22 NOTE — Telephone Encounter (Signed)
PATIENT NEEDS TO SPEAK TO YOU SO YOU CAN HELP HIM WITH HIS Daine Gip

## 2021-07-22 NOTE — Telephone Encounter (Signed)
See other note

## 2021-07-22 NOTE — Telephone Encounter (Signed)
Tried to call pt back.  VM not set up.

## 2021-08-11 DIAGNOSIS — Q8501 Neurofibromatosis, type 1: Secondary | ICD-10-CM | POA: Diagnosis not present

## 2021-08-11 DIAGNOSIS — I10 Essential (primary) hypertension: Secondary | ICD-10-CM | POA: Diagnosis not present

## 2021-08-11 DIAGNOSIS — M25561 Pain in right knee: Secondary | ICD-10-CM | POA: Diagnosis not present

## 2021-08-11 DIAGNOSIS — J449 Chronic obstructive pulmonary disease, unspecified: Secondary | ICD-10-CM | POA: Diagnosis not present

## 2021-08-11 DIAGNOSIS — Z681 Body mass index (BMI) 19 or less, adult: Secondary | ICD-10-CM | POA: Diagnosis not present

## 2021-08-11 DIAGNOSIS — Z1331 Encounter for screening for depression: Secondary | ICD-10-CM | POA: Diagnosis not present

## 2021-08-11 DIAGNOSIS — Z0000 Encounter for general adult medical examination without abnormal findings: Secondary | ICD-10-CM | POA: Diagnosis not present

## 2021-08-27 ENCOUNTER — Ambulatory Visit: Payer: Self-pay

## 2021-08-27 ENCOUNTER — Encounter: Payer: Self-pay | Admitting: Orthopaedic Surgery

## 2021-08-27 ENCOUNTER — Ambulatory Visit (INDEPENDENT_AMBULATORY_CARE_PROVIDER_SITE_OTHER): Payer: Medicare HMO | Admitting: Orthopaedic Surgery

## 2021-08-27 ENCOUNTER — Other Ambulatory Visit: Payer: Self-pay

## 2021-08-27 VITALS — Ht 70.0 in | Wt 124.0 lb

## 2021-08-27 DIAGNOSIS — M25561 Pain in right knee: Secondary | ICD-10-CM | POA: Diagnosis not present

## 2021-08-27 DIAGNOSIS — M1611 Unilateral primary osteoarthritis, right hip: Secondary | ICD-10-CM | POA: Diagnosis not present

## 2021-08-27 DIAGNOSIS — Q8500 Neurofibromatosis, unspecified: Secondary | ICD-10-CM | POA: Diagnosis not present

## 2021-08-27 DIAGNOSIS — G8929 Other chronic pain: Secondary | ICD-10-CM

## 2021-08-27 NOTE — Progress Notes (Signed)
Office Visit Note   Patient: Kevin Dalton           Date of Birth: September 09, 1955           MRN: [id number] Visit Date: 08/27/2021              Requested by: Neale Burly, MD [place],  [place] [id number] PCP: Neale Burly, MD   Assessment & Plan: Visit Diagnoses:  1. Chronic pain of right knee   2. Neurofibromatosis ([place])   3. 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  He also has left knee severe arthritis valgus but is not particularly painful.  Right leg splint is better leg but now hip pain is so so severe he is having great difficulty ambulating.  He will require right total hip arthroplasty and then can decide at a later point if he wants to proceed with left total knee arthroplasty.  Procedure discussed risk surgery discussed.  Outlined treatment plan discussed with patient and also his sister.  She states she also wants to call his other sister who lives in [place] did discuss this as well.  They are certainly free to call we have a number I can call him back which patient consents to my discussion of recommended outlined treatment plan for him with him.  Patient's preop right total arthroplasty procedure discussed questions elicited and answered.  Follow-Up Instructions: No follow-ups on file.   Orders:  Orders Placed This Encounter  Procedures   XR KNEE 3 VIEW RIGHT   No orders of the defined types were placed in this encounter.     Procedures: No procedures performed   Clinical Data: No additional findings.   Subjective: Chief Complaint  Patient presents with   Right Knee - Pain    HPI 66 year old male referred to me by Dr.Hasanaj for right knee pain primarily just above the knee and slightly lateral present x6 months he is walking with a cane.  He has had valgus deformity of his opposite left knee inability to reach full extension  with his left knee but states left knee really does not hurt as much has a severe pain that has distal right thigh.  He has used icy hot over-the-counter creams tramadol without relief.  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  He had a sign with likely type II who passed away.  Patient describes multiple not neurofibroma inside his body.  Patient's sister at the end of the visit was on the phone we discussed patient's diagnosis and outlined plan.  Review of Systems type I neurofibromatosis with multiple cutaneous neurofibroma pedunculated greater than 100.   Objective: Vital Signs: Ht 5\' 10"  (1.778 m)    Wt 124 lb (56.2 kg)    BMI 17.79 kg/m   Physical Exam Constitutional:      Appearance: He is well-developed.     Comments: Multiple neurofibromatosis facial neck all extremities trunk over entire body.  HENT:     Head: Normocephalic and atraumatic.     Right Ear: External ear normal.     Left Ear: External ear normal.  Eyes:     Pupils: Pupils are equal, round, and reactive to light.  Neck:     Thyroid: No thyromegaly.     Trachea:  No tracheal deviation.  Cardiovascular:     Rate and Rhythm: Normal rate.  Pulmonary:     Effort: Pulmonary effort is normal.     Breath sounds: No wheezing.  Abdominal:     General: Bowel sounds are normal.     Palpations: Abdomen is soft.  Musculoskeletal:     Cervical back: Neck supple.  Skin:    General: Skin is warm and dry.     Capillary Refill: Capillary refill takes less than 2 seconds.  Neurological:     Mental Status: He is alert and oriented to person, place, and time.  Psychiatric:        Behavior: Behavior normal.        Thought Content: Thought content normal.        Judgment: Judgment normal.    Ortho Exam patient extremely pain just above the knee with internal rotation of the hip.  He is  ambulatory with a right hip limp as well as valgus of his left knee.  Opposite left hip has good range of motion without pain  Specialty Comments:  No specialty comments available.  Imaging: No results found.   PMFS History: Patient Active Problem List   Diagnosis Date Noted   Neurofibromatosis ([place]) 08/27/2021   Unilateral primary osteoarthritis, right hip 08/27/2021   Other closed fractures of upper end of humerus 06/01/2011   Pain in joint, shoulder region 06/01/2011   Muscle weakness (generalized) 06/01/2011   Past Medical History:  Diagnosis Date   Hypertension    Neurofibromatosis     No family history on file.  Past Surgical History:  Procedure Laterality Date   COLONOSCOPY WITH PROPOFOL N/A 01/23/2021   Procedure: COLONOSCOPY WITH PROPOFOL;  Surgeon: Eloise Harman, DO;  Location: [facility];  Service: Endoscopy;  Laterality: N/A;  ASA I/II / 12:00   NO PAST SURGERIES     POLYPECTOMY  01/23/2021   Procedure: POLYPECTOMY;  Surgeon: Eloise Harman, DO;  Location: [facility];  Service: Endoscopy;;   Social History   Occupational History   Not on file  Tobacco Use   Smoking status: Some Days    Packs/day: 0.25    Types: Cigarettes   Smokeless tobacco: Never  Vaping Use   Vaping Use: Never used  Substance and Sexual Activity   Alcohol use: Yes    Comment: occasionally   Drug use: No   Sexual activity: Not on file

## 2021-08-28 ENCOUNTER — Telehealth: Payer: Self-pay | Admitting: Orthopaedic Surgery

## 2021-08-28 ENCOUNTER — Telehealth: Payer: Self-pay

## 2021-08-28 NOTE — Telephone Encounter (Signed)
Pt's sister Stanton Kidney lives in [place]. She would like a call back cell # [phone] home # [phone] she is concerned about the pt needing surgery and would like to discuss this with Dr. Lorin Mercy. Pt is currently in the hospital

## 2021-08-28 NOTE — Telephone Encounter (Signed)
noted 

## 2021-08-28 NOTE — Telephone Encounter (Signed)
Patient called. He says he would like to have the surgery. His call back number is [phone]

## 2021-08-28 NOTE — Telephone Encounter (Signed)
Please advise 

## 2021-08-31 ENCOUNTER — Telehealth: Payer: Self-pay | Admitting: *Deleted

## 2021-08-31 NOTE — Telephone Encounter (Signed)
Scheduled nurse visit by telephone for tomorrow 09/01/2021 at 2:00.

## 2021-08-31 NOTE — Telephone Encounter (Signed)
Pt called to ask about scheduling his colonoscopy. He said he brought the questionnaire form to the office and is trying to work out having his hip replacement or colonoscopy. Please advise if he needs OV or NV.

## 2021-09-01 ENCOUNTER — Ambulatory Visit (INDEPENDENT_AMBULATORY_CARE_PROVIDER_SITE_OTHER): Payer: Self-pay | Admitting: *Deleted

## 2021-09-01 ENCOUNTER — Other Ambulatory Visit: Payer: Self-pay

## 2021-09-01 VITALS — Ht 72.0 in | Wt 142.0 lb

## 2021-09-01 DIAGNOSIS — Z86010 Personal history of colonic polyps: Secondary | ICD-10-CM

## 2021-09-01 NOTE — Progress Notes (Addendum)
Gastroenterology Pre-Procedure Review  Request Date: 09/01/2021 Requesting Physician: 6 mo repeat, Last TCS done 01/23/2021 by Dr. Abbey Chatters, advanced polyps, margins negative, tubulovillous adenoma, tubular adenoma, family hx of colon cancer (brother)  PATIENT REVIEW QUESTIONS: The patient responded to the following health history questions as indicated:    1. Diabetes Melitis: no 2. Joint replacements in the past 12 months: no 3. Major health problems in the past 3 months: no 4. Has an artificial valve or MVP: no 5. Has a defibrillator: no 6. Has been advised in past to take antibiotics in advance of a procedure like teeth cleaning: no 7. Family history of colon cancer: yes, brother: age unknown  34. Alcohol Use: yes, 2 beers a week 9. Illicit drug Use: no 10. History of sleep apnea: no  11. History of coronary artery or other vascular stents placed within the last 12 months: no 12. History of any prior anesthesia complications: no 13. Body mass index is 19.26 kg/m.    MEDICATIONS & ALLERGIES:    Patient reports the following regarding taking any blood thinners:   Plavix? no Aspirin? no Coumadin? no Brilinta? no Xarelto? no Eliquis? no Pradaxa? no Savaysa? no Effient? no  Patient confirms/reports the following medications:  Current Outpatient Medications  Medication Sig Dispense Refill   acetaminophen (TYLENOL) 500 MG tablet Take 1,000 mg by mouth as needed for moderate pain. Takes 2 tablets as needed.     amLODipine (NORVASC) 5 MG tablet Take 5 mg by mouth daily.     benazepril (LOTENSIN) 10 MG tablet Take 10 mg by mouth daily.     No current facility-administered medications for this visit.    Patient confirms/reports the following allergies:  No Known Allergies  No orders of the defined types were placed in this encounter.   AUTHORIZATION INFORMATION Primary Insurance: [facility],  ID #: [id number],  Group #: [id number] [place] Pre-Cert / Josem Kaufmann required: No, not  required  SCHEDULE INFORMATION: Procedure has been scheduled as follows:  Date: 09/22/2021, Time: 10:30 Location: [facility] with Dr. Abbey Chatters  This Gastroenterology Pre-Precedure Review Form is being routed to the following provider(s): Neil Crouch, PA-C

## 2021-09-03 NOTE — Progress Notes (Signed)
Ok to schedule. ASA 2.  

## 2021-09-08 ENCOUNTER — Telehealth: Payer: Self-pay | Admitting: Internal Medicine

## 2021-09-08 ENCOUNTER — Encounter: Payer: Self-pay | Admitting: *Deleted

## 2021-09-08 MED ORDER — NA SULFATE-K SULFATE-MG SULF 17.5-3.13-1.6 GM/177ML PO SOLN: 1.0000 | Freq: Once | ORAL | 0 refills | Status: AC

## 2021-09-08 NOTE — Telephone Encounter (Signed)
Patient called you back, please give him a call when you can

## 2021-09-08 NOTE — Progress Notes (Signed)
Spoke to pt.  He requested to have Suprep sent in.  Informed him that it is a Tier 4 out of 5.  Encouraged him to call pharmacy to see if affordable and call me back today if we need to change it.  Reviewed prep instructions for Suprep by phone.  Pt voiced understanding.  Scheduled procedure for 09/22/2021 with arrival at 9:00 at [facility].  Pt aware that I will mail out prep instructions with all information.

## 2021-09-08 NOTE — Telephone Encounter (Signed)
Pt called me back and said that Suprep wasn't going to cost him anything.  He wants to proceed with Suprep instructions.  Will mail out in the morning.

## 2021-09-08 NOTE — Progress Notes (Signed)
Pt called me back and informed me that his prep kit is covered and he would like to proceed with Suprep instructions.  Mailing out accordingly.

## 2021-09-08 NOTE — Addendum Note (Signed)
Addended by: Metro Kung on: 09/08/2021 11:10 AM   Modules accepted: Orders

## 2021-09-16 ENCOUNTER — Telehealth: Payer: Self-pay | Admitting: Orthopaedic Surgery

## 2021-09-16 NOTE — Telephone Encounter (Signed)
Do you have blue sheet for patient?

## 2021-09-16 NOTE — Telephone Encounter (Signed)
Spoke with patient and scheduled surgery. °

## 2021-09-16 NOTE — Telephone Encounter (Signed)
Patient called. He would like to speak with Dr. Lorin Mercy. Says he has not heard anything about his surgery date. His call back number is [phone]

## 2021-09-17 ENCOUNTER — Other Ambulatory Visit: Payer: Self-pay

## 2021-09-22 ENCOUNTER — Ambulatory Visit (HOSPITAL_BASED_OUTPATIENT_CLINIC_OR_DEPARTMENT_OTHER): Payer: Medicare HMO | Admitting: Anesthesiology

## 2021-09-22 ENCOUNTER — Ambulatory Visit (HOSPITAL_COMMUNITY)
Admission: RE | Admit: 2021-09-22 | Discharge: 2021-09-22 | Disposition: A | Discharge status: Home / Self Care | Payer: Medicare HMO | Attending: Internal Medicine | Admitting: Internal Medicine

## 2021-09-22 ENCOUNTER — Encounter (HOSPITAL_COMMUNITY): Payer: Self-pay

## 2021-09-22 ENCOUNTER — Other Ambulatory Visit: Payer: Self-pay

## 2021-09-22 ENCOUNTER — Ambulatory Visit (HOSPITAL_COMMUNITY): Payer: Medicare HMO | Admitting: Anesthesiology

## 2021-09-22 ENCOUNTER — Encounter (HOSPITAL_COMMUNITY)
Admission: RE | Disposition: A | Discharge status: Home / Self Care | Payer: Self-pay | Source: Home / Self Care | Attending: Internal Medicine

## 2021-09-22 DIAGNOSIS — K648 Other hemorrhoids: Secondary | ICD-10-CM

## 2021-09-22 DIAGNOSIS — F17210 Nicotine dependence, cigarettes, uncomplicated: Secondary | ICD-10-CM | POA: Diagnosis not present

## 2021-09-22 DIAGNOSIS — Q8500 Neurofibromatosis, unspecified: Secondary | ICD-10-CM | POA: Diagnosis not present

## 2021-09-22 DIAGNOSIS — Z86010 Personal history of colonic polyps: Secondary | ICD-10-CM | POA: Diagnosis not present

## 2021-09-22 DIAGNOSIS — Z09 Encounter for follow-up examination after completed treatment for conditions other than malignant neoplasm: Secondary | ICD-10-CM | POA: Insufficient documentation

## 2021-09-22 DIAGNOSIS — K635 Polyp of colon: Secondary | ICD-10-CM

## 2021-09-22 DIAGNOSIS — I10 Essential (primary) hypertension: Secondary | ICD-10-CM

## 2021-09-22 DIAGNOSIS — Z1211 Encounter for screening for malignant neoplasm of colon: Secondary | ICD-10-CM | POA: Diagnosis not present

## 2021-09-22 DIAGNOSIS — D124 Benign neoplasm of descending colon: Secondary | ICD-10-CM | POA: Diagnosis not present

## 2021-09-22 DIAGNOSIS — R69 Illness, unspecified: Secondary | ICD-10-CM | POA: Diagnosis not present

## 2021-09-22 HISTORY — PX: COLONOSCOPY WITH PROPOFOL: SHX5780

## 2021-09-22 HISTORY — PX: POLYPECTOMY: SHX5525

## 2021-09-22 SURGERY — COLONOSCOPY WITH PROPOFOL
Anesthesia: General

## 2021-09-22 MED ORDER — LACTATED RINGERS IV SOLN: INTRAVENOUS | Status: DC

## 2021-09-22 MED ORDER — PROPOFOL 10 MG/ML IV BOLUS
INTRAVENOUS | Status: DC | PRN
Start: 2021-09-22 — End: 2021-09-22
  Administered 2021-09-22: 50 mg via INTRAVENOUS
  Administered 2021-09-22: 100 mg via INTRAVENOUS
  Administered 2021-09-22 (×3): 50 mg via INTRAVENOUS

## 2021-09-22 MED ORDER — PHENYLEPHRINE 40 MCG/ML (10ML) SYRINGE FOR IV PUSH (FOR BLOOD PRESSURE SUPPORT)
PREFILLED_SYRINGE | INTRAVENOUS | Status: DC | PRN
  Administered 2021-09-22: 80 ug via INTRAVENOUS

## 2021-09-22 MED ORDER — LIDOCAINE HCL (CARDIAC) PF 100 MG/5ML IV SOSY
PREFILLED_SYRINGE | INTRAVENOUS | Status: DC | PRN
  Administered 2021-09-22: 50 mg via INTRAVENOUS

## 2021-09-22 NOTE — Anesthesia Procedure Notes (Signed)
Date/Time: 09/22/2021 10:30 AM Performed by: Orlie Dakin, CRNA Pre-anesthesia Checklist: Patient identified, Emergency Drugs available, Suction available and Patient being monitored Patient Re-evaluated:Patient Re-evaluated prior to induction Oxygen Delivery Method: Nasal cannula Induction Type: IV induction Placement Confirmation: positive ETCO2

## 2021-09-22 NOTE — Anesthesia Postprocedure Evaluation (Signed)
Anesthesia Post Note  Patient: Kevin Dalton  Procedure(s) Performed: COLONOSCOPY WITH PROPOFOL POLYPECTOMY  Patient location during evaluation: Phase II Anesthesia Type: General Level of consciousness: awake Pain management: pain level controlled Vital Signs Assessment: post-procedure vital signs reviewed and stable Respiratory status: spontaneous breathing and respiratory function stable Cardiovascular status: blood pressure returned to baseline and stable Postop Assessment: no headache and no apparent nausea or vomiting Anesthetic complications: no Comments: Late entry   No notable events documented.   Last Vitals:  Vitals:   09/22/21 1045 09/22/21 1050  BP: (!) 83/58 109/72  Pulse: 79 85  Resp: 15 18  Temp: 36.5 C   SpO2: 97% 98%    Last Pain:  Vitals:   09/22/21 1055  TempSrc:   PainSc: [place]

## 2021-09-22 NOTE — Op Note (Signed)
[facility] Patient Name: Kevin Dalton Procedure Date: 09/22/2021 10:17 AM MRN: [id number] Date of Birth: 1956/07/06 Attending MD: Elon Alas. Abbey Chatters DO CSN: [id number] Age: 66 Admit Type: Outpatient Procedure:                Colonoscopy Indications:              High risk colon cancer surveillance: Personal                            history of adenoma with villous component Providers:                Elon Alas. Abbey Chatters, DO, Caprice Kluver, Casimer Bilis, Technician Referring MD:              Medicines:                See the Anesthesia note for documentation of the                            administered medications Complications:            No immediate complications. Estimated Blood Loss:     Estimated blood loss was minimal. Procedure:                Pre-Anesthesia Assessment:                           - The anesthesia plan was to use monitored                            anesthesia care (MAC).                           After obtaining informed consent, the colonoscope                            was passed under direct vision. Throughout the                            procedure, the patient's blood pressure, pulse, and                            oxygen saturations were monitored continuously. The                            [id number]-HQ190L ([id number]) scope was introduced through                            the anus and advanced to the the cecum, identified                            by appendiceal orifice and ileocecal valve. The                            colonoscopy was performed without difficulty.  The                            patient tolerated the procedure well. The quality                            of the bowel preparation was evaluated using the                            BBPS [facility] Bowel Preparation Scale) with scores                            of: Right Colon = 3, Transverse Colon = 3 and Left                            Colon = 3 (entire  mucosa seen well with no residual                            staining, small fragments of stool or opaque                            liquid). The total BBPS score equals 9. Scope In: 10:26:50 AM Scope Out: 10:42:13 AM Scope Withdrawal Time: 0 hours 11 minutes 35 seconds  Total Procedure Duration: 0 hours 15 minutes 23 seconds  Findings:      The perianal and digital rectal examinations were normal.      Non-bleeding internal hemorrhoids were found during endoscopy.      Three sessile polyps were found in the descending colon. The polyps were       3 to 5 mm in size. These polyps were removed with a cold snare.       Resection and retrieval were complete.      The exam was otherwise without abnormality. Impression:               - Non-bleeding internal hemorrhoids.                           - Three 3 to 5 mm polyps in the descending colon,                            removed with a cold snare. Resected and retrieved.                           - The examination was otherwise normal. Moderate Sedation:      Per Anesthesia Care Recommendation:           - Patient has a contact number available for                            emergencies. The signs and symptoms of potential                            delayed complications were discussed with the  patient. Return to normal activities tomorrow.                            Written discharge instructions were provided to the                            patient.                           - Resume previous diet.                           - Continue present medications.                           - Await pathology results.                           - Repeat colonoscopy in 3 years for surveillance.                           - Return to GI clinic PRN. Procedure Code(s):        --- Professional ---                           [phone], Colonoscopy, flexible; with removal of                            tumor(s), polyp(s), or other  lesion(s) by snare                            technique Diagnosis Code(s):        --- Professional ---                           Z86.010, Personal history of colonic polyps                           K63.5, Polyp of colon                           K64.8, Other hemorrhoids CPT copyright 2019 American Medical Association. All rights reserved. The codes documented in this report are preliminary and upon coder review may  be revised to meet current compliance requirements. Elon Alas. Abbey Chatters, DO [place] Abbey Chatters, DO 09/22/2021 10:45:31 AM This report has been signed electronically. Number of Addenda: 0

## 2021-09-22 NOTE — Anesthesia Preprocedure Evaluation (Signed)
Anesthesia Evaluation  Patient identified by MRN, date of birth, ID band Patient awake    Reviewed: Allergy & Precautions, H&P , NPO status , Patient's Chart, lab work & pertinent test results, reviewed documented beta blocker date and time   Airway Mallampati: II  TM Distance: >3 FB Neck ROM: full    Dental no notable dental hx. (+) Teeth Intact   Pulmonary neg pulmonary ROS, Current Smoker,    Pulmonary exam normal breath sounds clear to auscultation       Cardiovascular Exercise Tolerance: Good hypertension, negative cardio ROS   Rhythm:regular Rate:Normal     Neuro/Psych negative neurological ROS  negative psych ROS   GI/Hepatic negative GI ROS, Neg liver ROS,   Endo/Other  negative endocrine ROS  Renal/GU negative Renal ROS  negative genitourinary   Musculoskeletal  (+) Arthritis ,   Abdominal   Peds  Hematology negative hematology ROS (+)   Anesthesia Other Findings Neurofibromatosis  Reproductive/Obstetrics negative OB ROS                             Anesthesia Physical  Anesthesia Plan  ASA: 2  Anesthesia Plan: General   Post-op Pain Management:    Induction:   PONV Risk Score and Plan: Propofol infusion  Airway Management Planned:   Additional Equipment:   Intra-op Plan:   Post-operative Plan:   Informed Consent: I have reviewed the patients History and Physical, chart, labs and discussed the procedure including the risks, benefits and alternatives for the proposed anesthesia with the patient or authorized representative who has indicated his/her understanding and acceptance.     Dental Advisory Given  Plan Discussed with: CRNA  Anesthesia Plan Comments:         Anesthesia Quick Evaluation

## 2021-09-22 NOTE — Transfer of Care (Signed)
Immediate Anesthesia Transfer of Care Note  Patient: Kevin Dalton  Procedure(s) Performed: COLONOSCOPY WITH PROPOFOL POLYPECTOMY  Patient Location: Endoscopy Unit  Anesthesia Type:General  Level of Consciousness: drowsy  Airway & Oxygen Therapy: Patient Spontanous Breathing  Post-op Assessment: Report given to RN and Post -op Vital signs reviewed and stable  Post vital signs: Reviewed and stable  Last Vitals:  Vitals Value Taken Time  BP    Temp    Pulse    Resp    SpO2      Last Pain:  Vitals:   09/22/21 1021  TempSrc:   PainSc: 0-No pain      Patients Stated Pain Goal: 3 (00/76/22 6333)  Complications: No notable events documented.

## 2021-09-22 NOTE — Discharge Instructions (Addendum)
°  Colonoscopy Discharge Instructions  Read the instructions outlined below and refer to this sheet in the next few weeks. These discharge instructions provide you with general information on caring for yourself after you leave the hospital. Your doctor may also give you specific instructions. While your treatment has been planned according to the most current medical practices available, unavoidable complications occasionally occur.   ACTIVITY You may resume your regular activity, but move at a slower pace for the next 24 hours.  Take frequent rest periods for the next 24 hours.  Walking will help get rid of the air and reduce the bloated feeling in your belly (abdomen).  No driving for 24 hours (because of the medicine (anesthesia) used during the test).   Do not sign any important legal documents or operate any machinery for 24 hours (because of the anesthesia used during the test).  NUTRITION Drink plenty of fluids.  You may resume your normal diet as instructed by your doctor.  Begin with a light meal and progress to your normal diet. Heavy or fried foods are harder to digest and may make you feel sick to your stomach (nauseated).  Avoid alcoholic beverages for 24 hours or as instructed.  MEDICATIONS You may resume your normal medications unless your doctor tells you otherwise.  WHAT YOU CAN EXPECT TODAY Some feelings of bloating in the abdomen.  Passage of more gas than usual.  Spotting of blood in your stool or on the toilet paper.  IF YOU HAD POLYPS REMOVED DURING THE COLONOSCOPY: No aspirin products for 7 days or as instructed.  No alcohol for 7 days or as instructed.  Eat a soft diet for the next 24 hours.  FINDING OUT THE RESULTS OF YOUR TEST Not all test results are available during your visit. If your test results are not back during the visit, make an appointment with your caregiver to find out the results. Do not assume everything is normal if you have not heard from your  caregiver or the medical facility. It is important for you to follow up on all of your test results.  SEEK IMMEDIATE MEDICAL ATTENTION IF: You have more than a spotting of blood in your stool.  Your belly is swollen (abdominal distention).  You are nauseated or vomiting.  You have a temperature over 101.  You have abdominal pain or discomfort that is severe or gets worse throughout the day.   Your colonoscopy revealed 3 polyp(s) which I removed successfully. Await pathology results, my office will contact you. I recommend repeating colonoscopy in 3 years for surveillance purposes. Otherwise follow up with GI as needed.    I hope you have a great rest of your week!  Elon Alas. Abbey Chatters, D.O. Gastroenterology and Hepatology [facility]

## 2021-09-22 NOTE — H&P (Addendum)
Primary Care Physician:  Neale Burly, MD Primary Gastroenterologist:  Dr. Abbey Chatters  Pre-Procedure History & Physical: HPI:  Kevin Dalton is a 66 y.o. male is here for a colonoscopy to be performed for surveillance purposes. Last TCS 8 months ago with multiple large polyps, tubulovillous adenoma with high grade dysplasia.  Past Medical History:  Diagnosis Date   Hypertension    Neurofibromatosis     Past Surgical History:  Procedure Laterality Date   COLONOSCOPY WITH PROPOFOL N/A 01/23/2021   Procedure: COLONOSCOPY WITH PROPOFOL;  Surgeon: Eloise Harman, DO;  [facility];  Service: Endoscopy;  Laterality: N/A;  ASA I/II / 12:00   NO PAST SURGERIES     POLYPECTOMY  01/23/2021   Procedure: POLYPECTOMY;  Surgeon: Eloise Harman, DO;  [facility];  Service: Endoscopy;;    Prior to Admission medications   Medication Sig Start Date End Date Taking? Authorizing Provider  amLODipine (NORVASC) 5 MG tablet Take 5 mg by mouth daily.   Yes [provider]  benazepril (LOTENSIN) 10 MG tablet Take 10 mg by mouth daily. 08/11/21  Yes [provider]  traMADol (ULTRAM) 50 MG tablet Take 50 mg by mouth 2 (two) times daily as needed for severe pain or moderate pain. 09/10/21  Yes [provider]  acetaminophen (TYLENOL) 500 MG tablet Take 1,000 mg by mouth daily as needed for moderate pain.    [provider]    Allergies as of 09/08/2021   (No Known Allergies)    Family History  Problem Relation Age of Onset   Colon cancer Brother     Social History   Socioeconomic History   Marital status: Single    Spouse name: Not on file   Number of children: Not on file   Years of education: Not on file   Highest education level: Not on file  Occupational History   Not on file  Tobacco Use   Smoking status: Some Days    Packs/day: 0.25    Types: Cigarettes   Smokeless tobacco: Never  Vaping Use   Vaping Use: Never used   Substance and Sexual Activity   Alcohol use: Yes    Comment: occasionally   Drug use: No   Sexual activity: Not on file  Other Topics Concern   Not on file  Social History Narrative   Not on file   Social Determinants of Health   Financial Resource Strain: Not on file  Food Insecurity: Not on file  Transportation Needs: Not on file  Physical Activity: Not on file  Stress: Not on file  Social Connections: Not on file  Intimate Partner Violence: Not on file    Review of Systems: See HPI, otherwise negative ROS  Physical Exam: Vital signs in last 24 hours: Temp:  [97.7 F (36.5 C)] 97.7 F (36.5 C) (02/14 0910) Pulse Rate:  [78] 78 (02/14 0910) Resp:  [17] 17 (02/14 0910) BP: (140)/(98) 140/98 (02/14 0910) SpO2:  [100 %] 100 % (02/14 0910) Weight:  [64.4 kg] 64.4 kg (02/14 0910)   General:   Alert,  Well-developed, well-nourished, pleasant and cooperative in NAD Head:  Normocephalic and atraumatic. Eyes:  Sclera clear, no icterus.   Conjunctiva pink. Ears:  Normal auditory acuity. Nose:  No deformity, discharge,  or lesions. Mouth:  No deformity or lesions, dentition normal. Neck:  Supple; no masses or thyromegaly. Lungs:  Clear throughout to auscultation.   No wheezes, crackles, or rhonchi. No acute  distress. Heart:  Regular rate and rhythm; no murmurs, clicks, rubs,  or gallops. Abdomen:  Soft, nontender and nondistended. No masses, hepatosplenomegaly or hernias noted. Normal bowel sounds, without guarding, and without rebound.   Msk:  Symmetrical without gross deformities. Normal posture. Extremities:  Without clubbing or edema. Neurologic:  Alert and  oriented x4;  grossly normal neurologically. Skin:  Numerous neurofibromas Cervical Nodes:  No significant cervical adenopathy. Psych:  Alert and cooperative. Normal mood and affect.  Impression/Plan: Kevin Dalton is here for a colonoscopy to be performed for surveillance purposes. Last TCS 8 months ago with  multiple large polyps, tubulovillous adenoma with high grade dysplasia.   The risks of the procedure including infection, bleed, or perforation as well as benefits, limitations, alternatives and imponderables have been reviewed with the patient. Questions have been answered. All parties agreeable.

## 2021-09-23 LAB — SURGICAL PATHOLOGY

## 2021-09-24 ENCOUNTER — Encounter (HOSPITAL_COMMUNITY): Payer: Self-pay | Admitting: Internal Medicine

## 2021-09-24 ENCOUNTER — Other Ambulatory Visit: Payer: Self-pay

## 2021-09-24 ENCOUNTER — Ambulatory Visit (INDEPENDENT_AMBULATORY_CARE_PROVIDER_SITE_OTHER): Payer: Medicare HMO | Admitting: Surgery

## 2021-09-24 VITALS — BP 138/88 | HR 69 | Ht 72.0 in | Wt 142.0 lb

## 2021-09-24 DIAGNOSIS — M1611 Unilateral primary osteoarthritis, right hip: Secondary | ICD-10-CM

## 2021-09-28 NOTE — Progress Notes (Signed)
Surgical Instructions    Your procedure is scheduled on 10/02/21.  Report to [facility] Main Entrance "A" at 8:10 A.M., then check in with the Admitting office.  Call this number if you have problems the morning of surgery:  [phone]   If you have any questions prior to your surgery date call [phone]: Open Monday-Friday 8am-4pm    Remember:  Do not eat after midnight the night before your surgery  You may drink clear liquids until 7:10 the morning of your surgery.   Clear liquids allowed are: Water, Non-Citrus Juices (without pulp), Carbonated Beverages, Clear Tea, Black Coffee ONLY (NO MILK, CREAM OR POWDERED CREAMER of any kind), and Gatorade  Please complete your PRE-SURGERY ENSURE that was provided to you by 7:10 the morning of surgery.  Please, if able, drink it in one setting. DO NOT SIP.     Take these medicines the morning of surgery with A SIP OF WATER:  amLODipine (NORVASC)   AS NEEDED: traMADol (ULTRAM)    As of today, STOP taking any Aspirin (unless otherwise instructed by your surgeon) Aleve, Naproxen, Ibuprofen, Motrin, Advil, Goody's, BC's, all herbal medications, fish oil, and all vitamins.           Do not wear jewelry  Do not wear lotions, powders, colognes, or deodorant. Do not shave 48 hours prior to surgery.  Men may shave face and neck. Do not bring valuables to the hospital.   [facility] is not responsible for any belongings or valuables. .   Do NOT Smoke (Tobacco/Vaping)  24 hours prior to your procedure  If you use a CPAP at night, you may bring your mask for your overnight stay.   Contacts, glasses, hearing aids, dentures or partials may not be worn into surgery, please bring cases for these belongings   For patients admitted to the hospital, discharge time will be determined by your treatment team.   Patients discharged the day of surgery will not be allowed to drive home, and someone needs to stay with them for 24 hours.  NO VISITORS  WILL BE ALLOWED IN PRE-OP WHERE PATIENTS ARE PREPPED FOR SURGERY.  ONLY 1 SUPPORT PERSON MAY BE PRESENT IN THE WAITING ROOM WHILE YOU ARE IN SURGERY.  IF YOU ARE TO BE ADMITTED, ONCE YOU ARE IN YOUR ROOM YOU WILL BE ALLOWED TWO (2) VISITORS. 1 (ONE) VISITOR MAY STAY OVERNIGHT BUT MUST ARRIVE TO THE ROOM BY 8pm.  Minor children may have two parents present. Special consideration for safety and communication needs will be reviewed on a case by case basis.  Special instructions:    Oral Hygiene is also important to reduce your risk of infection.  Remember - BRUSH YOUR TEETH THE MORNING OF SURGERY WITH YOUR REGULAR TOOTHPASTE   [place]- Preparing For Surgery  Before surgery, you can play an important role. Because skin is not sterile, your skin needs to be as free of germs as possible. You can reduce the number of germs on your skin by washing with CHG (chlorahexidine gluconate) Soap before surgery.  CHG is an antiseptic cleaner which kills germs and bonds with the skin to continue killing germs even after washing.     Please do not use if you have an allergy to CHG or antibacterial soaps. If your skin becomes reddened/irritated stop using the CHG.  Do not shave (including legs and underarms) for at least 48 hours prior to first CHG shower. It is OK to shave your face.  Please  follow these instructions carefully.     Shower the NIGHT BEFORE SURGERY and the MORNING OF SURGERY with CHG Soap.   If you chose to wash your hair, wash your hair first as usual with your normal shampoo. After you shampoo, rinse your hair and body thoroughly to remove the shampoo.  Then [employer] and genitals (private parts) with your normal soap and rinse thoroughly to remove soap.  After that Use CHG Soap as you would any other liquid soap. You can apply CHG directly to the skin and wash gently with a scrungie or a clean washcloth.   Apply the CHG Soap to your body ONLY FROM THE NECK DOWN.  Do not use on open wounds  or open sores. Avoid contact with your eyes, ears, mouth and genitals (private parts). Wash Face and genitals (private parts)  with your normal soap.   Wash thoroughly, paying special attention to the area where your surgery will be performed.  Thoroughly rinse your body with warm water from the neck down.  DO NOT shower/wash with your normal soap after using and rinsing off the CHG Soap.  Pat yourself dry with a CLEAN TOWEL.  Wear CLEAN PAJAMAS to bed the night before surgery  Place CLEAN SHEETS on your bed the night before your surgery  DO NOT SLEEP WITH PETS.   Day of Surgery:  Take a shower with CHG soap. Wear Clean/Comfortable clothing the morning of surgery Do not apply any deodorants/lotions.   Remember to brush your teeth WITH YOUR REGULAR TOOTHPASTE.    COVID testing  If you are going to stay overnight or be admitted after your procedure/surgery and require a pre-op COVID test, please follow these instructions after your COVID test   You are not required to quarantine however you are required to wear a well-fitting mask when you are out and around people not in your household.  If your mask becomes wet or soiled, replace with a new one.  Wash your hands often with soap and water for 20 seconds or clean your hands with an alcohol-based hand sanitizer that contains at least 60% alcohol.  Do not share personal items.  Notify your provider: if you are in close contact with someone who has COVID  or if you develop a fever of 100.4 or greater, sneezing, cough, sore throat, shortness of breath or body aches.    Please read over the following fact sheets that you were given.

## 2021-09-29 ENCOUNTER — Other Ambulatory Visit: Payer: Self-pay

## 2021-09-29 ENCOUNTER — Encounter (HOSPITAL_COMMUNITY)
Admission: RE | Admit: 2021-09-29 | Discharge: 2021-09-29 | Disposition: A | Discharge status: Home / Self Care | Payer: Medicare HMO | Source: Ambulatory Visit | Attending: Orthopaedic Surgery | Admitting: Orthopaedic Surgery

## 2021-09-29 ENCOUNTER — Encounter (HOSPITAL_COMMUNITY): Payer: Self-pay

## 2021-09-29 VITALS — BP 150/99 | HR 81 | Temp 98.7°F | Resp 17 | Ht 72.5 in | Wt 124.0 lb

## 2021-09-29 DIAGNOSIS — Z20822 Contact with and (suspected) exposure to covid-19: Secondary | ICD-10-CM | POA: Insufficient documentation

## 2021-09-29 DIAGNOSIS — Z01818 Encounter for other preprocedural examination: Secondary | ICD-10-CM | POA: Insufficient documentation

## 2021-09-29 LAB — COMPREHENSIVE METABOLIC PANEL
ALT: 16 U/L (ref 0–44)
AST: 19 U/L (ref 15–41)
Albumin: 3.4 g/dL — ABNORMAL LOW (ref 3.5–5.0)
Alkaline Phosphatase: 74 U/L (ref 38–126)
Anion gap: 7 (ref 5–15)
BUN: 6 mg/dL — ABNORMAL LOW (ref 8–23)
CO2: 26 mmol/L (ref 22–32)
Calcium: 8.8 mg/dL — ABNORMAL LOW (ref 8.9–10.3)
Chloride: 101 mmol/L (ref 98–111)
Creatinine, Ser: 0.69 mg/dL (ref 0.61–1.24)
GFR, Estimated: >60 mL/min (ref >60)
Glucose, Bld: 93 mg/dL (ref 70–99)
Potassium: 4.7 mmol/L (ref 3.5–5.1)
Sodium: 134 mmol/L — ABNORMAL LOW (ref 135–145)
Total Bilirubin: 0.5 mg/dL (ref 0.3–1.2)
Total Protein: 6.8 g/dL (ref 6.5–8.1)

## 2021-09-29 LAB — SURGICAL PCR SCREEN
MRSA, PCR: NEGATIVE
Staphylococcus aureus: NEGATIVE

## 2021-09-29 LAB — CBC
HCT: 40.5 % (ref 39.0–52.0)
Hemoglobin: 13.7 g/dL (ref 13.0–17.0)
MCH: 32.9 pg (ref 26.0–34.0)
MCHC: 33.8 g/dL (ref 30.0–36.0)
MCV: 97.4 fL (ref 80.0–100.0)
Platelets: 268 10*3/uL (ref 150–400)
RBC: 4.16 MIL/uL — ABNORMAL LOW (ref 4.22–5.81)
RDW: 13.4 % (ref 11.5–15.5)
WBC: 6.2 10*3/uL (ref 4.0–10.5)
nRBC: 0 % (ref 0.0–0.2)

## 2021-09-29 NOTE — Progress Notes (Addendum)
AUE:BVPL Hasanaj, MD Cardiologist: denies  EKG: 09/29/21 CXR: na ECHO: 05/13/21 CE Stress Test: denies Cardiac Cath: denies  Fasting Blood Sugar- na Checks Blood Sugar__na_ times a day  OSA/CPAP: No  ASA/Blood Thinner: No  Covid test 09/29/21 at PAT  Anesthesia Review: Yes, stress test was uninterpretable.  Recommended follow up [place].  I do not see this was done.  Patient denies any chest pain since. Requested records and any cardiac studies from Dr. Sherrie Sport.  Patient denies shortness of breath, fever, cough, and chest pain at PAT appointment.  Patient verbalized understanding of instructions provided today at the PAT appointment.  Patient asked to review instructions at home and day of surgery.

## 2021-09-30 ENCOUNTER — Telehealth: Payer: Self-pay | Admitting: Orthopaedic Surgery

## 2021-09-30 LAB — SARS CORONAVIRUS 2 (TAT 6-24 HRS): SARS Coronavirus 2: NEGATIVE

## 2021-09-30 NOTE — Progress Notes (Addendum)
Anesthesia Chart Review:  Case: [id number] Date/Time: 10/02/21 0956   Procedure: RIGHT TOTAL HIP ARTHROPLASTY ANTERIOR APPROACH (Right: Hip)   Anesthesia type: Choice   Pre-op diagnosis: right hip osteoarthritis   Location: [facility] OR ROOM 06 / [place] OR   Surgeons: Marybelle Killings, MD       DISCUSSION: Patient is a 66 year old male scheduled for the above procedure.  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   Appears his PCP Dr. Sherrie Sport ordered an ETT last year. Per [facility] CE results on 05/13/21 were equivocal due to significant artifact and poor exercise tolerance, consider Lexiscan MPI.  He denied chest pain at PAT visit. He could not provide many details about 2022 ETT, so PCP records requested. I also contacted Dr. Joselyn Arrow office on 09/30/21 and spoke with [employer]. She discussed with Dr. Amanda Pea, and he will need to see Mr. Susman for preoperative evaluation. His office will contact patient to see if he can come in for visit on 10/01/21 at 12:00 PM. I have communicated this with [place] at Dr. Lorin Mercy' office.    09/29/21 presurgical COVID-19 test negative.  ADDENDUM 10/01/21 3:17 PM: Patient had preoperative evaluation by PCP Neale Burly, MD today. BP 127/80. He noted patient's stress test was incomplete because of patient's "bad knee".  Patient denied CV symptoms. Clinical evaluation was considered "NORMAL". He felt patient was "low risk".  He signed a clearance form noting he cleared patient from both a medical and cardiac standpoint.    Anesthesia team to evaluate on the day of surgery.   VS: BP (!) 150/99    Pulse 81    Temp 37.1 C    Resp 17    Ht 6' 0.5" (1.842 m)    Wt 56.2 kg    SpO2 100%    BMI 16.59 kg/m    PROVIDERS: Neale Burly, MD is PCP ([facility] [phone])   LABS: Labs reviewed: Acceptable for surgery. (all labs ordered are listed, but only abnormal results are displayed)  Labs Reviewed  CBC  - Abnormal; Notable for the following components:      Result Value   RBC 4.16 (*)    All other components within normal limits  COMPREHENSIVE METABOLIC PANEL - Abnormal; Notable for the following components:   Sodium 134 (*)    BUN 6 (*)    Calcium 8.8 (*)    Albumin 3.4 (*)    All other components within normal limits  SURGICAL PCR SCREEN  SARS CORONAVIRUS 2 (TAT 6-24 HRS)  TYPE AND SCREEN    OTHER: Surveillance Colonoscopy 09/22/21: Impression: - Non-bleeding internal hemorrhoids. - Three 3 to 5 mm polyps in the descending colon, removed with a cold snare. Resected and retrieved. - The examination was otherwise normal.   EKG: 09/29/21: Normal sinus rhythm Possible Left atrial enlargement Borderline ECG When compared with ECG of 27-Feb-2020 15:19, No significant change was found Confirmed by Virl Axe [phone]) on 09/29/2021 10:18:12 PM   CV: ETT 05/13/21 [facility] CE, ordered by PCP Hasanaj, Samul Dada, MD): Narrative:  Overall, the patient's exercise capacity was poor.   Equivocal ETT due to significant artifact as well as poor exercise  tolerance.   Recommend a follow up [place] [facility]  - Per PCP Dr. Sherrie Sport on 10/01/21: "STRES TEST WAS [place]". He cleared patient for 10/02/21 right THA without additional testing.     Past Medical History:  Diagnosis Date   Hypertension  Neurofibromatosis     Past Surgical History:  Procedure Laterality Date   COLONOSCOPY WITH PROPOFOL N/A 01/23/2021   Procedure: COLONOSCOPY WITH PROPOFOL;  Surgeon: Eloise Harman, DO;  [facility];  Service: Endoscopy;  Laterality: N/A;  ASA I/II / 12:00   COLONOSCOPY WITH PROPOFOL N/A 09/22/2021   Procedure: COLONOSCOPY WITH PROPOFOL;  Surgeon: Eloise Harman, DO;  [facility];  Service: Endoscopy;  Laterality: N/A;  10:30 / ASA 2   NO PAST SURGERIES     POLYPECTOMY  01/23/2021   Procedure: POLYPECTOMY;  Surgeon: Eloise Harman, DO;  [facility];  Service: Endoscopy;;   POLYPECTOMY  09/22/2021   Procedure: POLYPECTOMY;  Surgeon: Eloise Harman, DO;  [facility];  Service: Endoscopy;;    MEDICATIONS:  amLODipine (NORVASC) 5 MG tablet   benazepril (LOTENSIN) 10 MG tablet   traMADol (ULTRAM) 50 MG tablet   No current facility-administered medications for this encounter.    Myra Gianotti, PA-C Surgical Short Stay/Anesthesiology [facility] Phone [phone] [facility] Phone [phone] 09/30/2021 4:55 PM

## 2021-09-30 NOTE — Telephone Encounter (Signed)
Pt called and states that he needs to speak with yates with questions about his surgery Friday.  [phone]

## 2021-09-30 NOTE — Telephone Encounter (Signed)
Please advise 

## 2021-10-01 DIAGNOSIS — Z681 Body mass index (BMI) 19 or less, adult: Secondary | ICD-10-CM | POA: Diagnosis not present

## 2021-10-01 DIAGNOSIS — M25559 Pain in unspecified hip: Secondary | ICD-10-CM | POA: Diagnosis not present

## 2021-10-01 NOTE — Progress Notes (Signed)
Subjective:  Chief Complaint: right hip pain  HPI: Kevin Dalton, 66 y.o. male, has a history of pain and functional disability in the right hip(s) due to arthritis and patient has failed non-surgical conservative treatments for greater than 12 weeks to include NSAID's and/or analgesics, use of assistive devices, and activity modification.  Onset of symptoms was gradual starting 10 years ago with gradually worsening course since that time.The patient noted no past surgery on the right hip(s).  Patient currently rates pain in the right hip at 10 out of 10 with activity. Patient has night pain, worsening of pain with activity and weight bearing, trendelenberg gait, pain that interfers with activities of daily living, pain with passive range of motion, and joint swelling. Patient has evidence of subchondral cysts, subchondral sclerosis, periarticular osteophytes, and joint space narrowing by imaging studies. This condition presents safety issues increasing the risk of falls. .  There is no current active infection.  Patient Active Problem List   Diagnosis Date Noted   Neurofibromatosis ([place]) 08/27/2021   Unilateral primary osteoarthritis, right hip 08/27/2021   Other closed fractures of upper end of humerus 06/01/2011   Pain in joint, shoulder region 06/01/2011   Muscle weakness (generalized) 06/01/2011   Past Medical History:  Diagnosis Date   Hypertension    Neurofibromatosis     Past Surgical History:  Procedure Laterality Date   COLONOSCOPY WITH PROPOFOL N/A 01/23/2021   Procedure: COLONOSCOPY WITH PROPOFOL;  Surgeon: Eloise Harman, DO;  [facility];  Service: Endoscopy;  Laterality: N/A;  ASA I/II / 12:00   COLONOSCOPY WITH PROPOFOL N/A 09/22/2021   Procedure: COLONOSCOPY WITH PROPOFOL;  Surgeon: Eloise Harman, DO;  [facility];  Service: Endoscopy;  Laterality: N/A;  10:30 / ASA 2   NO PAST SURGERIES     POLYPECTOMY  01/23/2021   Procedure: POLYPECTOMY;   Surgeon: Eloise Harman, DO;  [facility];  Service: Endoscopy;;   POLYPECTOMY  09/22/2021   Procedure: POLYPECTOMY;  Surgeon: Eloise Harman, DO;  [facility];  Service: Endoscopy;;    No current facility-administered medications for this encounter.   Current Outpatient Medications  Medication Sig Dispense Refill Last Dose   amLODipine (NORVASC) 5 MG tablet Take 5 mg by mouth daily.      benazepril (LOTENSIN) 10 MG tablet Take 10 mg by mouth daily.      traMADol (ULTRAM) 50 MG tablet Take 50 mg by mouth 2 (two) times daily as needed for severe pain or moderate pain.      No Known Allergies  Social History   Tobacco Use   Smoking status: Some Days    Packs/day: 0.25    Types: Cigarettes   Smokeless tobacco: Never  Substance Use Topics   Alcohol use: Yes    Comment: rarely    Family History  Problem Relation Age of Onset   Colon cancer Brother      Review of Systems  Constitutional:  Positive for activity change.  HENT: Negative.    Respiratory: Negative.    Cardiovascular: Negative.   Genitourinary: Negative.   Musculoskeletal:  Positive for gait problem.   Objective:  Physical Exam HENT:     Head: Normocephalic and atraumatic.  Eyes:     Extraocular Movements: Extraocular movements intact.  Cardiovascular:     Rate and Rhythm: Regular rhythm.     Heart sounds: Normal heart sounds.  Pulmonary:     Effort: Pulmonary effort is  normal. No respiratory distress.  Musculoskeletal:        General: Tenderness present.  Neurological:     Mental Status: He is alert and oriented to person, place, and time.  Psychiatric:        Mood and Affect: Mood normal.    Vital signs in last 24 hours:    Labs:   Estimated body mass index is 16.59 kg/m as calculated from the following:   Height as of 09/29/21: 6' 0.5" (1.842 m).   Weight as of 09/29/21: 56.2 kg.   Imaging Review Plain radiographs demonstrate moderate degenerative joint disease  of the right hip(s). The bone quality appears to be good for age and reported activity level.      Assessment/Plan:  End stage arthritis, right hip(s)  The patient history, physical examination, clinical judgement of the provider and imaging studies are consistent with end stage degenerative joint disease of the right hip(s) and total hip arthroplasty is deemed medically necessary. The treatment options including medical management, injection therapy, arthroscopy and arthroplasty were discussed at length. The risks and benefits of total hip arthroplasty were presented and reviewed. The risks due to aseptic loosening, infection, stiffness, dislocation/subluxation,  thromboembolic complications and other imponderables were discussed.  The patient acknowledged the explanation, agreed to proceed with the plan and consent was signed. Patient is being admitted for inpatient treatment for surgery, pain control, PT, OT, prophylactic antibiotics, VTE prophylaxis, progressive ambulation and ADL's and discharge planning.The patient is planning to be discharged home with home health services

## 2021-10-01 NOTE — H&P (Signed)
TOTAL HIP ADMISSION H&P  Patient is admitted for right total hip arthroplasty.  Subjective:  Chief Complaint: right hip pain  HPI: Kevin Dalton, 66 y.o. male, has a history of pain and functional disability in the right hip(s) due to arthritis and patient has failed non-surgical conservative treatments for greater than 12 weeks to include NSAID's and/or analgesics, use of assistive devices, and activity modification.  Onset of symptoms was gradual starting 10 years ago with gradually worsening course since that time.The patient noted no past surgery on the right hip(s).  Patient currently rates pain in the right hip at 10 out of 10 with activity. Patient has night pain, worsening of pain with activity and weight bearing, trendelenberg gait, pain that interfers with activities of daily living, pain with passive range of motion, and joint swelling. Patient has evidence of subchondral cysts, subchondral sclerosis, periarticular osteophytes, and joint space narrowing by imaging studies. This condition presents safety issues increasing the risk of falls. .  There is no current active infection.  Patient Active Problem List   Diagnosis Date Noted   Neurofibromatosis ([place]) 08/27/2021   Unilateral primary osteoarthritis, right hip 08/27/2021   Other closed fractures of upper end of humerus 06/01/2011   Pain in joint, shoulder region 06/01/2011   Muscle weakness (generalized) 06/01/2011   Past Medical History:  Diagnosis Date   Hypertension    Neurofibromatosis     Past Surgical History:  Procedure Laterality Date   COLONOSCOPY WITH PROPOFOL N/A 01/23/2021   Procedure: COLONOSCOPY WITH PROPOFOL;  Surgeon: Eloise Harman, DO;  [facility];  Service: Endoscopy;  Laterality: N/A;  ASA I/II / 12:00   COLONOSCOPY WITH PROPOFOL N/A 09/22/2021   Procedure: COLONOSCOPY WITH PROPOFOL;  Surgeon: Eloise Harman, DO;  Location: [facility];  Service: Endoscopy;  Laterality: N/A;  10:30 /  ASA 2   NO PAST SURGERIES     POLYPECTOMY  01/23/2021   Procedure: POLYPECTOMY;  Surgeon: Eloise Harman, DO;  Location: [facility];  Service: Endoscopy;;   POLYPECTOMY  09/22/2021   Procedure: POLYPECTOMY;  Surgeon: Eloise Harman, DO;  Location: [facility];  Service: Endoscopy;;    No current facility-administered medications for this encounter.   Current Outpatient Medications  Medication Sig Dispense Refill Last Dose   amLODipine (NORVASC) 5 MG tablet Take 5 mg by mouth daily.      benazepril (LOTENSIN) 10 MG tablet Take 10 mg by mouth daily.      traMADol (ULTRAM) 50 MG tablet Take 50 mg by mouth 2 (two) times daily as needed for severe pain or moderate pain.      No Known Allergies  Social History   Tobacco Use   Smoking status: Some Days    Packs/day: 0.25    Types: Cigarettes   Smokeless tobacco: Never  Substance Use Topics   Alcohol use: Yes    Comment: rarely    Family History  Problem Relation Age of Onset   Colon cancer Brother      Review of Systems  Constitutional:  Positive for activity change.  HENT: Negative.    Respiratory: Negative.    Cardiovascular: Negative.   Genitourinary: Negative.   Musculoskeletal:  Positive for gait problem.   Objective:  Physical Exam HENT:     Head: Normocephalic and atraumatic.  Eyes:     Extraocular Movements: Extraocular movements intact.  Cardiovascular:     Rate and Rhythm: Regular rhythm.     Heart  sounds: Normal heart sounds.  Pulmonary:     Effort: Pulmonary effort is normal. No respiratory distress.  Musculoskeletal:        General: Tenderness present.  Neurological:     Mental Status: He is alert and oriented to person, place, and time.  Psychiatric:        Mood and Affect: Mood normal.    Vital signs in last 24 hours:    Labs:   Estimated body mass index is 16.59 kg/m as calculated from the following:   Height as of 09/29/21: 6' 0.5" (1.842 m).   Weight as of 09/29/21: 56.2  kg.   Imaging Review Plain radiographs demonstrate moderate degenerative joint disease of the right hip(s). The bone quality appears to be good for age and reported activity level.      Assessment/Plan:  End stage arthritis, right hip(s)  The patient history, physical examination, clinical judgement of the provider and imaging studies are consistent with end stage degenerative joint disease of the right hip(s) and total hip arthroplasty is deemed medically necessary. The treatment options including medical management, injection therapy, arthroscopy and arthroplasty were discussed at length. The risks and benefits of total hip arthroplasty were presented and reviewed. The risks due to aseptic loosening, infection, stiffness, dislocation/subluxation,  thromboembolic complications and other imponderables were discussed.  The patient acknowledged the explanation, agreed to proceed with the plan and consent was signed. Patient is being admitted for inpatient treatment for surgery, pain control, PT, OT, prophylactic antibiotics, VTE prophylaxis, progressive ambulation and ADL's and discharge planning.The patient is planning to be discharged home with home health services

## 2021-10-01 NOTE — Anesthesia Preprocedure Evaluation (Addendum)
Anesthesia Evaluation  Patient identified by MRN, date of birth, ID band Patient awake    Reviewed: Allergy & Precautions, H&P , NPO status , Patient's Chart, lab work & pertinent test results, reviewed documented beta blocker date and time   Airway Mallampati: I  TM Distance: >3 FB Neck ROM: full    Dental no notable dental hx. (+) Poor Dentition, Chipped, Missing, Dental Advisory Given   Pulmonary neg pulmonary ROS, Current Smoker and Patient abstained from smoking.,    Pulmonary exam normal breath sounds clear to auscultation       Cardiovascular Exercise Tolerance: Good hypertension, negative cardio ROS   Rhythm:regular Rate:Normal     Neuro/Psych negative neurological ROS  negative psych ROS   GI/Hepatic negative GI ROS, Neg liver ROS,   Endo/Other  negative endocrine ROS  Renal/GU negative Renal ROS  negative genitourinary   Musculoskeletal  (+) Arthritis , Osteoarthritis,    Abdominal   Peds  Hematology negative hematology ROS (+)   Anesthesia Other Findings   Reproductive/Obstetrics negative OB ROS                           Anesthesia Physical Anesthesia Plan  ASA: 3  Anesthesia Plan: General   Post-op Pain Management: Minimal or no pain anticipated and Ofirmev IV (intra-op)*   Induction: Intravenous  PONV Risk Score and Plan:   Airway Management Planned: Oral ETT and LMA  Additional Equipment: None  Intra-op Plan:   Post-operative Plan: Extubation in OR  Informed Consent: I have reviewed the patients History and Physical, chart, labs and discussed the procedure including the risks, benefits and alternatives for the proposed anesthesia with the patient or authorized representative who has indicated his/her understanding and acceptance.     Dental Advisory Given  Plan Discussed with: CRNA and Anesthesiologist  Anesthesia Plan Comments: (PAT note written by  Myra Gianotti, PA-C. Cleared for surgery from a medical and cardiac standpoint following PCP evaluation by Neale Burly, MD on 10/01/21. DISCUSSION: Patient is a 66 year old male scheduled for the above procedure.  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   Appears his PCP Dr. Sherrie Sport ordered an ETT last year. Per [facility] CE results on 05/13/21 were equivocal due to significant artifact and poor exercise tolerance, consider Lexiscan MPI.  He denied chest pain at PAT visit. He could not provide many details about 2022 ETT, so PCP records requested. I also contacted Dr. Joselyn Arrow office on 09/30/21 and spoke with [employer]. She discussed with Dr. Amanda Pea, and he will need to see Mr. Spellman for preoperative evaluation. His office will contact patient to see if he can come in for visit on 10/01/21 at 12:00 PM. I have communicated this with [place] at Dr. Lorin Mercy' office.    09/29/21 presurgical COVID-19 test negative.  ADDENDUM 10/01/21 3:17 PM: Patient had preoperative evaluation by PCP Neale Burly, MD today. BP 127/80. He noted patient's stress test was incomplete because of patient's "bad knee".  Patient denied CV symptoms. Clinical evaluation was considered "NORMAL". He felt patient was "low risk".  He signed a clearance form noting he cleared patient from both a medical and cardiac standpoint.  )      Anesthesia Quick Evaluation

## 2021-10-02 ENCOUNTER — Inpatient Hospital Stay (HOSPITAL_COMMUNITY)
Admission: RE | Admit: 2021-10-02 | Discharge: 2021-10-07 | DRG: 470 | Disposition: A | Discharge status: Skilled Nursing Facility | Payer: Medicare HMO | Attending: Orthopaedic Surgery | Admitting: Orthopaedic Surgery

## 2021-10-02 ENCOUNTER — Encounter (HOSPITAL_COMMUNITY): Payer: Self-pay | Admitting: Orthopaedic Surgery

## 2021-10-02 ENCOUNTER — Inpatient Hospital Stay (HOSPITAL_COMMUNITY): Payer: Medicare HMO

## 2021-10-02 ENCOUNTER — Inpatient Hospital Stay (HOSPITAL_COMMUNITY): Payer: Medicare HMO | Admitting: Physician Assistant

## 2021-10-02 ENCOUNTER — Other Ambulatory Visit: Payer: Self-pay

## 2021-10-02 ENCOUNTER — Encounter (HOSPITAL_COMMUNITY)
Admission: RE | Disposition: A | Discharge status: Skilled Nursing Facility | Payer: Self-pay | Source: Home / Self Care | Attending: Orthopaedic Surgery

## 2021-10-02 ENCOUNTER — Inpatient Hospital Stay (HOSPITAL_COMMUNITY): Payer: Medicare HMO | Admitting: Anesthesiology

## 2021-10-02 ENCOUNTER — Observation Stay (HOSPITAL_COMMUNITY): Payer: Medicare HMO

## 2021-10-02 DIAGNOSIS — Z79899 Other long term (current) drug therapy: Secondary | ICD-10-CM

## 2021-10-02 DIAGNOSIS — Z09 Encounter for follow-up examination after completed treatment for conditions other than malignant neoplasm: Secondary | ICD-10-CM

## 2021-10-02 DIAGNOSIS — M1611 Unilateral primary osteoarthritis, right hip: Principal | ICD-10-CM | POA: Diagnosis present

## 2021-10-02 DIAGNOSIS — R636 Underweight: Secondary | ICD-10-CM | POA: Diagnosis present

## 2021-10-02 DIAGNOSIS — I10 Essential (primary) hypertension: Secondary | ICD-10-CM | POA: Diagnosis present

## 2021-10-02 DIAGNOSIS — Z681 Body mass index (BMI) 19 or less, adult: Secondary | ICD-10-CM

## 2021-10-02 DIAGNOSIS — Z96641 Presence of right artificial hip joint: Secondary | ICD-10-CM | POA: Diagnosis not present

## 2021-10-02 DIAGNOSIS — Z471 Aftercare following joint replacement surgery: Secondary | ICD-10-CM | POA: Diagnosis not present

## 2021-10-02 DIAGNOSIS — F17210 Nicotine dependence, cigarettes, uncomplicated: Secondary | ICD-10-CM | POA: Diagnosis present

## 2021-10-02 DIAGNOSIS — Z79891 Long term (current) use of opiate analgesic: Secondary | ICD-10-CM

## 2021-10-02 DIAGNOSIS — Q8500 Neurofibromatosis, unspecified: Secondary | ICD-10-CM

## 2021-10-02 DIAGNOSIS — Z419 Encounter for procedure for purposes other than remedying health state, unspecified: Secondary | ICD-10-CM

## 2021-10-02 DIAGNOSIS — K5900 Constipation, unspecified: Secondary | ICD-10-CM | POA: Diagnosis present

## 2021-10-02 DIAGNOSIS — Z01818 Encounter for other preprocedural examination: Secondary | ICD-10-CM

## 2021-10-02 HISTORY — PX: TOTAL HIP ARTHROPLASTY: SHX124

## 2021-10-02 LAB — ABO/RH: ABO/RH(D): O POS

## 2021-10-02 SURGERY — ARTHROPLASTY, HIP, TOTAL, ANTERIOR APPROACH
Anesthesia: General | Site: Hip | Laterality: Right

## 2021-10-02 MED ORDER — MIDAZOLAM HCL 2 MG/2ML IJ SOLN
INTRAMUSCULAR | Status: DC | PRN
  Administered 2021-10-02: 1 mg via INTRAVENOUS

## 2021-10-02 MED ORDER — ONDANSETRON HCL 4 MG/2ML IJ SOLN
INTRAMUSCULAR | Status: DC | PRN
  Administered 2021-10-02: 4 mg via INTRAVENOUS

## 2021-10-02 MED ORDER — BUPIVACAINE LIPOSOME 1.3 % IJ SUSP
INTRAMUSCULAR | Status: AC
  Filled 2021-10-02: qty 20

## 2021-10-02 MED ORDER — METHOCARBAMOL 1000 MG/10ML IJ SOLN
500.0000 mg | Freq: Four times a day (QID) | INTRAVENOUS | Status: DC | PRN
  Filled 2021-10-02: qty 5

## 2021-10-02 MED ORDER — BUPIVACAINE LIPOSOME 1.3 % IJ SUSP: 10.0000 mL | Freq: Once | INTRAMUSCULAR | Status: DC

## 2021-10-02 MED ORDER — PROPOFOL 10 MG/ML IV BOLUS
INTRAVENOUS | Status: DC | PRN
Start: 2021-10-02 — End: 2021-10-02
  Administered 2021-10-02: 130 mg via INTRAVENOUS

## 2021-10-02 MED ORDER — PROPOFOL 10 MG/ML IV BOLUS
INTRAVENOUS | Status: AC
  Filled 2021-10-02: qty 20

## 2021-10-02 MED ORDER — CEFAZOLIN SODIUM-DEXTROSE 2-4 GM/100ML-% IV SOLN
2.0000 g | INTRAVENOUS | Status: AC
  Administered 2021-10-02: 2 g via INTRAVENOUS
  Filled 2021-10-02: qty 100

## 2021-10-02 MED ORDER — BUPIVACAINE LIPOSOME 1.3 % IJ SUSP
INTRAMUSCULAR | Status: DC | PRN
  Administered 2021-10-02: 10 mL

## 2021-10-02 MED ORDER — METOCLOPRAMIDE HCL 5 MG/ML IJ SOLN: 5.0000 mg | Freq: Three times a day (TID) | INTRAMUSCULAR | Status: DC | PRN

## 2021-10-02 MED ORDER — HYDROMORPHONE HCL 1 MG/ML IJ SOLN
0.5000 mg | INTRAMUSCULAR | Status: DC | PRN
  Administered 2021-10-02 – 2021-10-03 (×3): 0.5 mg via INTRAVENOUS
  Filled 2021-10-02 (×3): qty 0.5

## 2021-10-02 MED ORDER — PHENYLEPHRINE 40 MCG/ML (10ML) SYRINGE FOR IV PUSH (FOR BLOOD PRESSURE SUPPORT)
PREFILLED_SYRINGE | INTRAVENOUS | Status: AC
  Filled 2021-10-02: qty 10

## 2021-10-02 MED ORDER — BUPIVACAINE HCL (PF) 0.5 % IJ SOLN
INTRAMUSCULAR | Status: AC
  Filled 2021-10-02: qty 30

## 2021-10-02 MED ORDER — MEPERIDINE HCL 25 MG/ML IJ SOLN: 6.2500 mg | INTRAMUSCULAR | Status: DC | PRN

## 2021-10-02 MED ORDER — FENTANYL CITRATE (PF) 100 MCG/2ML IJ SOLN
25.0000 ug | INTRAMUSCULAR | Status: DC | PRN
  Administered 2021-10-02 (×2): 25 ug via INTRAVENOUS

## 2021-10-02 MED ORDER — DEXAMETHASONE SODIUM PHOSPHATE 10 MG/ML IJ SOLN
INTRAMUSCULAR | Status: DC | PRN
  Administered 2021-10-02: 4 mg via INTRAVENOUS

## 2021-10-02 MED ORDER — MIDAZOLAM HCL 2 MG/2ML IJ SOLN
INTRAMUSCULAR | Status: AC
  Filled 2021-10-02: qty 2

## 2021-10-02 MED ORDER — SUGAMMADEX SODIUM 200 MG/2ML IV SOLN
INTRAVENOUS | Status: DC | PRN
  Administered 2021-10-02: 200 mg via INTRAVENOUS

## 2021-10-02 MED ORDER — 0.9 % SODIUM CHLORIDE (POUR BTL) OPTIME
TOPICAL | Status: DC | PRN
  Administered 2021-10-02: 1000 mL

## 2021-10-02 MED ORDER — DEXAMETHASONE SODIUM PHOSPHATE 10 MG/ML IJ SOLN
INTRAMUSCULAR | Status: AC
  Filled 2021-10-02: qty 1

## 2021-10-02 MED ORDER — DEXMEDETOMIDINE (PRECEDEX) IN NS 20 MCG/5ML (4 MCG/ML) IV SYRINGE
PREFILLED_SYRINGE | INTRAVENOUS | Status: AC
  Filled 2021-10-02: qty 5

## 2021-10-02 MED ORDER — OXYCODONE HCL 5 MG PO TABS: 5.0000 mg | ORAL_TABLET | Freq: Once | ORAL | Status: DC | PRN

## 2021-10-02 MED ORDER — OXYCODONE HCL 5 MG/5ML PO SOLN: 5.0000 mg | Freq: Once | ORAL | Status: DC | PRN

## 2021-10-02 MED ORDER — ONDANSETRON HCL 4 MG/2ML IJ SOLN: 4.0000 mg | Freq: Once | INTRAMUSCULAR | Status: DC | PRN

## 2021-10-02 MED ORDER — PHENYLEPHRINE HCL-NACL 20-0.9 MG/250ML-% IV SOLN
INTRAVENOUS | Status: DC | PRN
  Administered 2021-10-02: 50 ug/min via INTRAVENOUS

## 2021-10-02 MED ORDER — ACETAMINOPHEN 325 MG PO TABS
325.0000 mg | ORAL_TABLET | Freq: Four times a day (QID) | ORAL | Status: DC | PRN
  Administered 2021-10-03 (×3): 650 mg via ORAL
  Filled 2021-10-02 (×3): qty 2

## 2021-10-02 MED ORDER — BENAZEPRIL HCL 20 MG PO TABS
10.0000 mg | ORAL_TABLET | Freq: Every day | ORAL | Status: DC
  Administered 2021-10-03 – 2021-10-07 (×5): 10 mg via ORAL
  Filled 2021-10-02 (×5): qty 1

## 2021-10-02 MED ORDER — FENTANYL CITRATE (PF) 100 MCG/2ML IJ SOLN
INTRAMUSCULAR | Status: AC
  Administered 2021-10-02: 25 ug via INTRAVENOUS
  Filled 2021-10-02: qty 2

## 2021-10-02 MED ORDER — POLYETHYLENE GLYCOL 3350 17 G PO PACK
17.0000 g | PACK | Freq: Every day | ORAL | Status: DC | PRN
  Administered 2021-10-05: 17 g via ORAL
  Filled 2021-10-02: qty 1

## 2021-10-02 MED ORDER — PHENOL 1.4 % MT LIQD: 1.0000 | OROMUCOSAL | Status: DC | PRN

## 2021-10-02 MED ORDER — LACTATED RINGERS IV SOLN: INTRAVENOUS | Status: DC | PRN

## 2021-10-02 MED ORDER — SODIUM CHLORIDE 0.9 % IV SOLN: INTRAVENOUS | Status: DC

## 2021-10-02 MED ORDER — CHLORHEXIDINE GLUCONATE 0.12 % MT SOLN
OROMUCOSAL | Status: AC
Start: 2021-10-02 — End: 2021-10-02
  Administered 2021-10-02: 15 mL
  Filled 2021-10-02: qty 15

## 2021-10-02 MED ORDER — FENTANYL CITRATE (PF) 250 MCG/5ML IJ SOLN
INTRAMUSCULAR | Status: AC
  Filled 2021-10-02: qty 5

## 2021-10-02 MED ORDER — ORAL CARE MOUTH RINSE: 15.0000 mL | Freq: Once | OROMUCOSAL | Status: AC

## 2021-10-02 MED ORDER — ROCURONIUM BROMIDE 10 MG/ML (PF) SYRINGE
PREFILLED_SYRINGE | INTRAVENOUS | Status: DC | PRN
  Administered 2021-10-02: 20 mg via INTRAVENOUS
  Administered 2021-10-02: 60 mg via INTRAVENOUS

## 2021-10-02 MED ORDER — AMLODIPINE BESYLATE 5 MG PO TABS
5.0000 mg | ORAL_TABLET | Freq: Every day | ORAL | Status: DC
  Administered 2021-10-03 – 2021-10-07 (×5): 5 mg via ORAL
  Filled 2021-10-02 (×5): qty 1

## 2021-10-02 MED ORDER — MENTHOL 3 MG MT LOZG: 1.0000 | LOZENGE | OROMUCOSAL | Status: DC | PRN

## 2021-10-02 MED ORDER — ACETAMINOPHEN 160 MG/5ML PO SOLN: 325.0000 mg | ORAL | Status: DC | PRN

## 2021-10-02 MED ORDER — ONDANSETRON HCL 4 MG/2ML IJ SOLN: 4.0000 mg | Freq: Four times a day (QID) | INTRAMUSCULAR | Status: DC | PRN

## 2021-10-02 MED ORDER — BUPIVACAINE HCL 0.5 % IJ SOLN
INTRAMUSCULAR | Status: DC | PRN
  Administered 2021-10-02: 10 mL

## 2021-10-02 MED ORDER — ONDANSETRON HCL 4 MG/2ML IJ SOLN
INTRAMUSCULAR | Status: AC
  Filled 2021-10-02: qty 2

## 2021-10-02 MED ORDER — TRANEXAMIC ACID-NACL 1000-0.7 MG/100ML-% IV SOLN
1000.0000 mg | INTRAVENOUS | Status: AC
  Administered 2021-10-02: 1000 mg via INTRAVENOUS
  Filled 2021-10-02: qty 100

## 2021-10-02 MED ORDER — CHLORHEXIDINE GLUCONATE 0.12 % MT SOLN: 15.0000 mL | Freq: Once | OROMUCOSAL | Status: AC

## 2021-10-02 MED ORDER — DEXMEDETOMIDINE (PRECEDEX) IN NS 20 MCG/5ML (4 MCG/ML) IV SYRINGE
PREFILLED_SYRINGE | INTRAVENOUS | Status: DC | PRN
  Administered 2021-10-02: 8 ug via INTRAVENOUS
  Administered 2021-10-02: 12 ug via INTRAVENOUS

## 2021-10-02 MED ORDER — OXYCODONE HCL 5 MG PO TABS
5.0000 mg | ORAL_TABLET | ORAL | Status: DC | PRN
  Administered 2021-10-02: 10 mg via ORAL
  Administered 2021-10-02: 5 mg via ORAL
  Administered 2021-10-03 (×2): 10 mg via ORAL
  Administered 2021-10-03: 5 mg via ORAL
  Administered 2021-10-03 – 2021-10-07 (×17): 10 mg via ORAL
  Filled 2021-10-02 (×8): qty 2
  Filled 2021-10-02: qty 1
  Filled 2021-10-02 (×5): qty 2
  Filled 2021-10-02: qty 1
  Filled 2021-10-02 (×7): qty 2

## 2021-10-02 MED ORDER — ACETAMINOPHEN 325 MG PO TABS: 325.0000 mg | ORAL_TABLET | ORAL | Status: DC | PRN

## 2021-10-02 MED ORDER — ROCURONIUM BROMIDE 10 MG/ML (PF) SYRINGE
PREFILLED_SYRINGE | INTRAVENOUS | Status: AC
  Filled 2021-10-02: qty 10

## 2021-10-02 MED ORDER — METOCLOPRAMIDE HCL 5 MG PO TABS: 5.0000 mg | ORAL_TABLET | Freq: Three times a day (TID) | ORAL | Status: DC | PRN

## 2021-10-02 MED ORDER — FENTANYL CITRATE (PF) 250 MCG/5ML IJ SOLN
INTRAMUSCULAR | Status: DC | PRN
  Administered 2021-10-02: 50 ug via INTRAVENOUS
  Administered 2021-10-02 (×2): 100 ug via INTRAVENOUS

## 2021-10-02 MED ORDER — DOCUSATE SODIUM 100 MG PO CAPS
100.0000 mg | ORAL_CAPSULE | Freq: Two times a day (BID) | ORAL | Status: DC
  Administered 2021-10-02 – 2021-10-07 (×11): 100 mg via ORAL
  Filled 2021-10-02 (×11): qty 1

## 2021-10-02 MED ORDER — LACTATED RINGERS IV SOLN: INTRAVENOUS | Status: DC

## 2021-10-02 MED ORDER — LIDOCAINE 2% (20 MG/ML) 5 ML SYRINGE
INTRAMUSCULAR | Status: DC | PRN
  Administered 2021-10-02: 100 mg via INTRAVENOUS

## 2021-10-02 MED ORDER — METHOCARBAMOL 500 MG PO TABS
500.0000 mg | ORAL_TABLET | Freq: Four times a day (QID) | ORAL | Status: DC | PRN
  Administered 2021-10-02 – 2021-10-06 (×10): 500 mg via ORAL
  Filled 2021-10-02 (×11): qty 1

## 2021-10-02 MED ORDER — ASPIRIN EC 325 MG PO TBEC
325.0000 mg | DELAYED_RELEASE_TABLET | Freq: Every day | ORAL | Status: DC
  Administered 2021-10-03 – 2021-10-07 (×5): 325 mg via ORAL
  Filled 2021-10-02 (×5): qty 1

## 2021-10-02 MED ORDER — ONDANSETRON HCL 4 MG PO TABS: 4.0000 mg | ORAL_TABLET | Freq: Four times a day (QID) | ORAL | Status: DC | PRN

## 2021-10-02 MED ORDER — PHENYLEPHRINE 40 MCG/ML (10ML) SYRINGE FOR IV PUSH (FOR BLOOD PRESSURE SUPPORT)
PREFILLED_SYRINGE | INTRAVENOUS | Status: DC | PRN
  Administered 2021-10-02 (×3): 120 ug via INTRAVENOUS

## 2021-10-02 MED ORDER — LIDOCAINE 2% (20 MG/ML) 5 ML SYRINGE
INTRAMUSCULAR | Status: AC
  Filled 2021-10-02: qty 5

## 2021-10-02 SURGICAL SUPPLY — 53 items
ACETAB CUP W/GRIPTION 54 (Plate) ×2 IMPLANT
BAG COUNTER SPONGE SURGICOUNT (BAG) ×2 IMPLANT
BAG SPNG CNTER NS LX DISP (BAG) ×1
BLADE CLIPPER SURG (BLADE) IMPLANT
BLADE SAW SGTL 18X1.27X75 (BLADE) ×2 IMPLANT
COVER PERINEAL POST (MISCELLANEOUS) ×2 IMPLANT
COVER SURGICAL LIGHT HANDLE (MISCELLANEOUS) ×2 IMPLANT
CUP ACETAB W/GRIPTION 54 (Plate) IMPLANT
DRAPE C-ARM 42X72 X-RAY (DRAPES) ×2 IMPLANT
DRAPE IMP U-DRAPE 54X76 (DRAPES) ×2 IMPLANT
DRAPE STERI IOBAN 125X83 (DRAPES) ×2 IMPLANT
DRAPE U-SHAPE 47X51 STRL (DRAPES) ×6 IMPLANT
DRSG MEPILEX BORDER 4X8 (GAUZE/BANDAGES/DRESSINGS) ×1 IMPLANT
DURAPREP 26ML APPLICATOR (WOUND CARE) ×2 IMPLANT
ELECT BLADE 4.0 EZ CLEAN MEGAD (MISCELLANEOUS)
ELECT CAUTERY BLADE 6.4 (BLADE) ×2 IMPLANT
ELECT REM PT RETURN 9FT ADLT (ELECTROSURGICAL) ×2
ELECTRODE BLDE 4.0 EZ CLN MEGD (MISCELLANEOUS) IMPLANT
ELECTRODE REM PT RTRN 9FT ADLT (ELECTROSURGICAL) ×1 IMPLANT
ELIMINATOR HOLE APEX DEPUY (Hips) ×1 IMPLANT
FACESHIELD WRAPAROUND (MASK) ×4 IMPLANT
FACESHIELD WRAPAROUND OR TEAM (MASK) ×2 IMPLANT
GLOVE SRG 8 PF TXTR STRL LF DI (GLOVE) ×2 IMPLANT
GLOVE SURG ORTHO LTX SZ7.5 (GLOVE) ×4 IMPLANT
GLOVE SURG UNDER POLY LF SZ8 (GLOVE) ×4
GOWN STRL REUS W/ TWL LRG LVL3 (GOWN DISPOSABLE) ×1 IMPLANT
GOWN STRL REUS W/ TWL XL LVL3 (GOWN DISPOSABLE) ×1 IMPLANT
GOWN STRL REUS W/TWL 2XL LVL3 (GOWN DISPOSABLE) ×2 IMPLANT
GOWN STRL REUS W/TWL LRG LVL3 (GOWN DISPOSABLE) ×2
GOWN STRL REUS W/TWL XL LVL3 (GOWN DISPOSABLE) ×2
HEAD M SROM 36MM 2 (Hips) IMPLANT
KIT BASIN OR (CUSTOM PROCEDURE TRAY) ×2 IMPLANT
KIT TURNOVER KIT B (KITS) ×2 IMPLANT
LINER NEUTRAL 54X36MM PLUS 4 (Hips) ×1 IMPLANT
MANIFOLD NEPTUNE II (INSTRUMENTS) ×2 IMPLANT
NDL HYPO 21X1 ECLIPSE (NEEDLE) ×1 IMPLANT
NEEDLE HYPO 21X1 ECLIPSE (NEEDLE) ×2 IMPLANT
NS IRRIG 1000ML POUR BTL (IV SOLUTION) ×2 IMPLANT
PACK TOTAL JOINT (CUSTOM PROCEDURE TRAY) ×2 IMPLANT
PAD ARMBOARD 7.5X6 YLW CONV (MISCELLANEOUS) ×4 IMPLANT
SCREW PINN CAN 6.5X20 (Screw) ×1 IMPLANT
SROM M HEAD 36MM 2 (Hips) ×2 IMPLANT
STEM FEMORAL SZ8 STD ACTIS (Stem) ×1 IMPLANT
SUT VIC AB 0 CT1 27 (SUTURE) ×4
SUT VIC AB 0 CT1 27XBRD ANBCTR (SUTURE) IMPLANT
SUT VIC AB 2-0 CT1 27 (SUTURE) ×4
SUT VIC AB 2-0 CT1 TAPERPNT 27 (SUTURE) IMPLANT
SUT VLOC 180 0 24IN GS25 (SUTURE) ×1 IMPLANT
SYR 20CC LL (SYRINGE) ×2 IMPLANT
TOWEL GREEN STERILE (TOWEL DISPOSABLE) ×4 IMPLANT
TOWEL GREEN STERILE FF (TOWEL DISPOSABLE) ×2 IMPLANT
TRAY CATH 16FR W/PLASTIC CATH (SET/KITS/TRAYS/PACK) ×1 IMPLANT
WATER STERILE IRR 1000ML POUR (IV SOLUTION) ×4 IMPLANT

## 2021-10-02 NOTE — Evaluation (Signed)
Physical Therapy Evaluation Patient Details Name: Kevin Dalton MRN: [id number] DOB: 10-24-1955 Today's Date: 10/02/2021  History of Present Illness  Pt is 66 yo male admitted on 10/02/21 for R anterior THA.  Pt with hx including but not limited to HTN, OA, and neurofibromatosis  Clinical Impression  Pt is s/p THA resulting in the deficits listed below (see PT Problem List). At baseline, pt lives alone but is independent with ADLs, IADLs, and short community ambulation with a cane.  During evaluation, pt was min A transfers and able to ambulate 15'x2 in room with assist and cues.  Pt with weakness throughout all extremities.  Also, noting hip internal rotation and genu valgus on L (non-op side), genu valgus on R side, and difficulty with terminal knee ext in standing bilaterally.  Pt with good pain control (rated at 5/10 but no signs of pain and reports tolerable).  Pt does present with decreased safety in ability to return home due to weaknesses and lives alone. Pt will benefit from skilled PT to increase their independence and safety with mobility to allow discharge to the venue listed below.         Recommendations for follow up therapy are one component of a multi-disciplinary discharge planning process, led by the attending physician.  Recommendations may be updated based on patient status, additional functional criteria and insurance authorization.  Follow Up Recommendations Follow physician's recommendations for discharge plan and follow up therapies (Per MD note - pt lives alone with plan for SNF)    Assistance Recommended at Discharge Frequent or constant Supervision/Assistance  Patient can return home with the following  A little help with walking and/or transfers;A little help with bathing/dressing/bathroom;Assistance with cooking/housework;Help with stairs or ramp for entrance    Equipment Recommendations Rolling walker (2 wheels)  Recommendations for Other Services        Functional Status Assessment Patient has had a recent decline in their functional status and demonstrates the ability to make significant improvements in function in a reasonable and predictable amount of time.     Precautions / Restrictions Precautions Precautions: Fall Restrictions Weight Bearing Restrictions: Yes RLE Weight Bearing: Weight bearing as tolerated      Mobility  Bed Mobility Overal bed mobility: Needs Assistance Bed Mobility: Supine to Sit, Sit to Supine     Supine to sit: Supervision Sit to supine: Min assist   General bed mobility comments: HOB elevated and use of rails; assist for R LE back to bed    Transfers Overall transfer level: Needs assistance Equipment used: Rolling walker (2 wheels) Transfers: Sit to/from Stand Sit to Stand: Min assist           General transfer comment: Min A to rise with cues for R LE management and hand placement; stood from bed and toilet    Ambulation/Gait Ambulation/Gait assistance: Min assist Gait Distance (Feet): 15 Feet (15'x2) Assistive device: Rolling walker (2 wheels) Gait Pattern/deviations: Step-to pattern, Decreased stride length, Trunk flexed Gait velocity: decreased     General Gait Details: Noted bil genu valgus (worse on L non-op) and difficulty with terminal knee ext bil in standing.  Ambulates with flexed trunk.  Cues for safety and RW use  Stairs            Wheelchair Mobility    Modified Rankin (Stroke Patients Only)       Balance Overall balance assessment: Needs assistance Sitting-balance support: No upper extremity supported Sitting balance-Leahy Scale: Good     Standing  balance support: Bilateral upper extremity supported, Single extremity supported Standing balance-Leahy Scale: Poor Standing balance comment: Requiring at least single UE support; RW to ambulate; grab bar with standing to try to urinate                             Pertinent Vitals/Pain Pain  Assessment Pain Assessment: 0-10 Pain Score: 5  Pain Location: R hip Pain Descriptors / Indicators: Sore Pain Intervention(s): Limited activity within patient's tolerance, Monitored during session    Home Living Family/patient expects to be discharged to:: Private residence Living Arrangements: Alone Available Help at Discharge: Family;Available PRN/intermittently Type of Home: House Home Access: Stairs to enter Entrance Stairs-Rails: Can reach both;Left;Right Entrance Stairs-Number of Steps: 2   Home Layout: One level Home Equipment: Toilet riser;Cane - single point      Prior Function Prior Level of Function : Driving;Independent/Modified Independent             Mobility Comments: Can ambulate short community distances; uses motorized cart at grocery store ADLs Comments: Reports independent with ADLs and IADLs     Hand Dominance        Extremity/Trunk Assessment   Upper Extremity Assessment Upper Extremity Assessment: LUE deficits/detail;RUE deficits/detail RUE Deficits / Details: ROM: Shoulder elevation limited to ~90 degrees otherwise WFL; MMT 4-/5 LUE Deficits / Details: ROM: Shoulder elevation limited to ~90 degrees otherwise WFL; MMT 4-/5    Lower Extremity Assessment Lower Extremity Assessment: LLE deficits/detail;RLE deficits/detail RLE Deficits / Details: ROM WFL, did note genu valgus; MMT: ankle 4/5, knee 3/5, hip 2/5 LLE Deficits / Details: ROM: Hip internally rotates and genu valgus wtih gait (pt reports "been like that forever"), otherwise [facility]; MMT: ankle 4/5, knee 4/5, hip 4/5    Cervical / Trunk Assessment Cervical / Trunk Assessment: Other exceptions;Kyphotic Cervical / Trunk Exceptions: Trunk flexed with gait  Communication   Communication: No difficulties  Cognition Arousal/Alertness: Awake/alert Behavior During Therapy: WFL for tasks assessed/performed Overall Cognitive Status: Within Functional Limits for tasks assessed                                  General Comments: Requiring Repeated cues for MMT        General Comments General comments (skin integrity, edema, etc.): Pt attempted to urinate and have BM but unable.    Exercises     Assessment/Plan    PT Assessment Patient needs continued PT services  PT Problem List Decreased strength;Decreased mobility;Decreased safety awareness;Decreased range of motion;Decreased coordination;Decreased knowledge of precautions;Decreased activity tolerance;Decreased balance;Decreased knowledge of use of DME;Pain       PT Treatment Interventions DME instruction;Therapeutic activities;Modalities;Gait training;Therapeutic exercise;Patient/family education;Stair training;Balance training;Functional mobility training    PT Goals (Current goals can be found in the Care Plan section)  Acute Rehab PT Goals Patient Stated Goal: improve wlaking PT Goal Formulation: With patient Time For Goal Achievement: 10/16/21 Potential to Achieve Goals: Good    Frequency 7X/week     Co-evaluation               AM-PAC PT "6 Clicks" Mobility  Outcome Measure Help needed turning from your back to your side while in a flat bed without using bedrails?: A Little Help needed moving from lying on your back to sitting on the side of a flat bed without using bedrails?: A Little Help needed moving to and from a  bed to a chair (including a wheelchair)?: A Little Help needed standing up from a chair using your arms (e.g., wheelchair or bedside chair)?: A Little Help needed to walk in hospital room?: Total Help needed climbing 3-5 steps with a railing? : Total 6 Click Score: 14    End of Session Equipment Utilized During Treatment: Gait belt Activity Tolerance: Patient tolerated treatment well Patient left: in bed;with call bell/phone within reach;with bed alarm set Nurse Communication: Mobility status PT Visit Diagnosis: Other abnormalities of gait and mobility (R26.89);Muscle  weakness (generalized) (M62.81)    Time: 6720-9198 PT Time Calculation (min) (ACUTE ONLY): 32 min   Charges:   PT Evaluation $PT Eval Low Complexity: 1 Low PT Treatments $Gait Training: 8-22 mins        Abran Richard, PT Acute Rehab Services Pager [phone] [facility] [place] 10/02/2021, 5:41 PM

## 2021-10-02 NOTE — Op Note (Signed)
Pre and postoperative diagnosis: Right hip primary osteoarthritis.  Procedure: Right total hip arthroplasty.  Surgeon: Rodell Perna, MD  Anesthesia: General orotracheal plus Marcaine and Exparel.  EBL: Less than 200 cc.  Implants:Implants  Enzo Montgomery 54MM - [id number]  Inventory Item: Enzo Montgomery 54MM Serial no.:  Model/Cat no.: [id number]  Implant name: Enzo Montgomery 54MM - [id number] Laterality: Right Area: Hip  Manufacturer: [place] Date of Manufacture:    Action: Implanted Number Used: 1   Device Identifier:  Device Identifier TypeBrock Bad APEX DEPUY - [id number]  Inventory Item: ELIMINATOR HOLE APEX DEPUY Serial no.:  Model/Cat no.: [id number]  Implant nameBrock Bad APEX DEPUY - [id number] Laterality: Right Area: Hip  Manufacturer: [place] Date of Manufacture:    Action: Implanted Number Used: 1   Device Identifier:  Device Identifier Type:     SCREW PINN CAN 6.5X20 - [id number]  Inventory Item: SCREW PINN CAN 6.5X20 Serial no.:  Model/Cat no.: [id number]  Implant name: SCREW PINN CAN 6.5X20 - [id number] Laterality: Right Area: Hip  Manufacturer: [place] Date of Manufacture:    Action: Implanted Number Used: 1   Device Identifier:  Device Identifier Type:     LINER NEUTRAL 54X36MM PLUS 4 - [id number]  Inventory Item: LINER NEUTRAL 54X36MM PLUS 4 Serial no.:  Model/Cat no.: [id number]  Implant name: LINER NEUTRAL 54X36MM PLUS 4 - [id number] Laterality: Right Area: Hip  Manufacturer: [place] Date of Manufacture:    Action: Implanted Number Used: 1   Device Identifier:  Device Identifier Type:     STEM FEMORAL SZ8 STD ACTIS - [id number]  Inventory Item: STEM FEMORAL SZ8 STD ACTIS Serial no.:  Model/Cat no.: [id number]  Implant name: STEM FEMORAL SZ8 STD ACTIS - [id number] Laterality: Right Area: Hip  Manufacturer: [place] Date of Manufacture:    Action: Implanted Number Used: 1    Device Identifier:  Device Identifier Type:     SROM M HEAD 36MM 2 - [id number]  Inventory Item: SROM M HEAD 36MM 2 Serial no.:  Model/Cat no.: [id number]  Implant name: SROM M HEAD 36MM 2 - [id number] Laterality: Right Area: Hip  Manufacturer: [place] Date of Manufacture:    Action: Implanted Number Used: 1   Device Identifier:  Device Identifier Type:    Procedure: After induction of general anesthesia with recommendation Foley catheter placement Hana boot placement large size patient was placed on the Hana table careful padding positioning.  1015 drapes were applied hydraulic arm was available and right hip was prepped with DuraPrep.  Preoperative Ancef prophylaxis and IV TXA.  Sterile skin marker with large shower curtain Betadine Steri-Drape application and usual cross sheath proceed were applied timeout procedure completed.  Direct anterior approach was made starting at the ASIS obliquely to the trochanter.  Fascia was nicked extended in line with skin incision.  Dividing the fascia coming underneath the fascia and then putting dull cobra over the top of the capsule the capsule was extremely thick hypertrophic with just a moderate amount of clear joint fluid.  Neck was cut after documentation with C arm measurements the patient was about 1 cm short.  Neck was cut and was removed with a corkscrew and then sequential reaming was performed up to size 53 for 54 head.  +4 neutral liner was placed.  Hydraulic Cook applied external rotation posterior capsule divided but external rotators were saved.  External rotation 110 leg was taken down and under  and then preparation of the femur using cookie cutter canal started sequential reaming progressing up for a #8 stem once we get the broach.  We checked it 6 and it appeared to be slightly in varus and we hollowed out the trochanter more and took a little bit of additional cortex so we can get a nice straight entry down the canal and correct the varus  position.  Size 8 gave good fit and -1.5 ball had to be used which restored leg lengths.  We could not get the standard 1.5 mm neck length ball reduced.  Patient has neurofibromatosis and extremely thickened capsule with abnormal connective tissue.  Hip was stable external rotation 90 to be taken straight down to the floor with no subluxation no shock leg lengths were identical measurements with AP fluoroscopic image.  Lateral frog-leg image was taken which showed the stem directly down the middle canal lavage V-Loc closure Marcaine plus Exparel infiltration subcutaneous reapproximation skin staple closure postop dressing and transfer his care room.  Patient lives alone and will need SNF placement early next week.

## 2021-10-02 NOTE — Interval H&P Note (Signed)
History and Physical Interval Note:  10/02/2021 10:05 AM  Kevin Dalton  has presented today for surgery, with the diagnosis of right hip osteoarthritis.  The various methods of treatment have been discussed with the patient and family. After consideration of risks, benefits and other options for treatment, the patient has consented to  Procedure(s): RIGHT TOTAL HIP ARTHROPLASTY ANTERIOR APPROACH (Right) as a surgical intervention.  The patient's history has been reviewed, patient examined, no change in status, stable for surgery.  I have reviewed the patient's chart and labs.  Questions were answered to the patient's satisfaction.     Marybelle Killings

## 2021-10-02 NOTE — Transfer of Care (Signed)
Immediate Anesthesia Transfer of Care Note  Patient: Kevin Dalton  Procedure(s) Performed: RIGHT TOTAL HIP ARTHROPLASTY ANTERIOR APPROACH (Right: Hip)  Patient Location: PACU  Anesthesia Type:General  Level of Consciousness: awake, drowsy, patient cooperative and responds to stimulation  Airway & Oxygen Therapy: Patient Spontanous Breathing and Patient connected to nasal cannula oxygen  Post-op Assessment: Report given to RN and Post -op Vital signs reviewed and stable  Post vital signs: Reviewed and stable  Last Vitals:  Vitals Value Taken Time  BP 119/103 10/02/21 1257  Temp    Pulse 70 10/02/21 1258  Resp 15 10/02/21 1258  SpO2 95 % 10/02/21 1258  Vitals shown include unvalidated device data.  Last Pain:  Vitals:   10/02/21 0948  TempSrc:   PainSc: 10-Worst pain ever      Patients Stated Pain Goal: 0 (46/21/94 7125)  Complications: No notable events documented.

## 2021-10-02 NOTE — Progress Notes (Signed)
Patient ID: Kevin Dalton, male   DOB: [birth year], 66 y.o.   MRN: [id number] Lives alone , will be going to SNF at beginning of week. Need FL2 placed TODAY so I can sign and proceed for placement.

## 2021-10-02 NOTE — NC FL2 (Signed)
[place] LEVEL OF CARE SCREENING TOOL     IDENTIFICATION  Patient Name: Kevin Dalton Birthdate: 1956-01-01 Sex: male Admission Date (Current Location): 10/02/2021  [facility] and [place] Number:  Herbalist and Address:  [facility], [place] [address], [place], [place] [address]      Provider Number: [id number]  Attending Physician Name and Address:  Marybelle Killings, MD  Relative Name and Phone Number:  Al Pimple   [phone]    Current Level of Care: Hospital Recommended Level of Care: [place] Prior Approval Number:    Date Approved/Denied:   PASRR Number: [id number]  Discharge Plan: SNF    Current Diagnoses: Patient Active Problem List   Diagnosis Date Noted   Arthritis of right hip 10/02/2021   Neurofibromatosis ([place]) 08/27/2021   Unilateral primary osteoarthritis, right hip 08/27/2021   Other closed fractures of upper end of humerus 06/01/2011   Pain in joint, shoulder region 06/01/2011   Muscle weakness (generalized) 06/01/2011    Orientation RESPIRATION BLADDER Height & Weight     Self, Time, Situation, Place  O2 Continent Weight: 140 lb (63.5 kg) Height:  6' 0.05" (183 cm)  BEHAVIORAL SYMPTOMS/MOOD NEUROLOGICAL BOWEL NUTRITION STATUS      Continent Diet (see discharge summary)  AMBULATORY STATUS COMMUNICATION OF NEEDS Skin     Verbally Surgical wounds                       Personal Care Assistance Level of Assistance              Functional Limitations Info  Sight, Hearing, Speech Sight Info: Adequate Hearing Info: Adequate Speech Info: Adequate    SPECIAL CARE FACTORS FREQUENCY  PT (By licensed PT), OT (By licensed OT)     PT Frequency: 5x week OT Frequency: 5x week            Contractures Contractures Info: Not present    Additional Factors Info  Code Status, Allergies Code Status Info: full Allergies Info: NKA           Current Medications  (10/02/2021):  This is the current hospital active medication list Current Facility-Administered Medications  Medication Dose Route Frequency Provider Last Rate Last Admin   acetaminophen (TYLENOL) tablet 325-650 mg  325-650 mg Oral Q4H PRN Janeece Riggers, MD       Or   acetaminophen (TYLENOL) 160 MG/5ML solution 325-650 mg  325-650 mg Oral Q4H PRN Janeece Riggers, MD       bupivacaine liposome (EXPAREL) 1.3 % injection 133 mg  10 mL Infiltration Once Benjiman Core M, PA-C       fentaNYL (SUBLIMAZE) injection 25-50 mcg  25-50 mcg Intravenous Q5 min PRN Janeece Riggers, MD   25 mcg at 10/02/21 1359   lactated ringers infusion   Intravenous Continuous Janeece Riggers, MD 10 mL/hr at 10/02/21 0958 New Bag at 10/02/21 0958   meperidine (DEMEROL) injection 6.25-12.5 mg  6.25-12.5 mg Intravenous Q5 min PRN Janeece Riggers, MD       methocarbamol (ROBAXIN) tablet 500 mg  500 mg Oral Q6H PRN Lanae Crumbly, PA-C       Or   methocarbamol (ROBAXIN) 500 mg in dextrose 5 % 50 mL IVPB  500 mg Intravenous Q6H PRN Lanae Crumbly, PA-C       ondansetron [facility]) injection 4 mg  4 mg Intravenous Once PRN Janeece Riggers, MD  oxyCODONE (Oxy IR/ROXICODONE) immediate release tablet 5 mg  5 mg Oral Once PRN Janeece Riggers, MD       Or   oxyCODONE (ROXICODONE) 5 MG/5ML solution 5 mg  5 mg Oral Once PRN Janeece Riggers, MD         Discharge Medications: Please see discharge summary for a list of discharge medications.  Relevant Imaging Results:  Relevant Lab Results:   Additional Information SSN: [id number]  Joanne Chars, LCSW

## 2021-10-02 NOTE — Anesthesia Procedure Notes (Signed)
Procedure Name: Intubation Date/Time: 10/02/2021 10:43 AM Performed by: Cathren Harsh, CRNA Pre-anesthesia Checklist: Patient identified, Emergency Drugs available, Suction available and Patient being monitored Patient Re-evaluated:Patient Re-evaluated prior to induction Oxygen Delivery Method: Circle System Utilized Preoxygenation: Pre-oxygenation with 100% oxygen Induction Type: IV induction Ventilation: Mask ventilation without difficulty Laryngoscope Size: Mac and 4 Grade View: Grade I Tube type: Oral Tube size: 7.5 mm Number of attempts: 1 Airway Equipment and Method: Stylet and Oral airway Placement Confirmation: ETT inserted through vocal cords under direct vision, positive ETCO2 and breath sounds checked- equal and bilateral Secured at: 23 cm Tube secured with: Tape Dental Injury: Teeth and Oropharynx as per pre-operative assessment  Comments: By Loann Quillhonesty") Brooks Sailors, EMT student

## 2021-10-03 DIAGNOSIS — K5900 Constipation, unspecified: Secondary | ICD-10-CM | POA: Diagnosis not present

## 2021-10-03 DIAGNOSIS — R636 Underweight: Secondary | ICD-10-CM | POA: Diagnosis not present

## 2021-10-03 DIAGNOSIS — I10 Essential (primary) hypertension: Secondary | ICD-10-CM | POA: Diagnosis not present

## 2021-10-03 DIAGNOSIS — Z79891 Long term (current) use of opiate analgesic: Secondary | ICD-10-CM | POA: Diagnosis not present

## 2021-10-03 DIAGNOSIS — M1611 Unilateral primary osteoarthritis, right hip: Secondary | ICD-10-CM | POA: Diagnosis not present

## 2021-10-03 DIAGNOSIS — Z681 Body mass index (BMI) 19 or less, adult: Secondary | ICD-10-CM | POA: Diagnosis not present

## 2021-10-03 DIAGNOSIS — R69 Illness, unspecified: Secondary | ICD-10-CM | POA: Diagnosis not present

## 2021-10-03 DIAGNOSIS — F17210 Nicotine dependence, cigarettes, uncomplicated: Secondary | ICD-10-CM | POA: Diagnosis present

## 2021-10-03 DIAGNOSIS — Q8500 Neurofibromatosis, unspecified: Secondary | ICD-10-CM | POA: Diagnosis not present

## 2021-10-03 DIAGNOSIS — Z79899 Other long term (current) drug therapy: Secondary | ICD-10-CM | POA: Diagnosis not present

## 2021-10-03 LAB — BPAM RBC
Blood Product Expiration Date: 202303262359
Blood Product Expiration Date: 202303272359
Unit Type and Rh: 5100
Unit Type and Rh: 5100

## 2021-10-03 LAB — BASIC METABOLIC PANEL
Anion gap: 8 (ref 5–15)
BUN: 9 mg/dL (ref 8–23)
CO2: 21 mmol/L — ABNORMAL LOW (ref 22–32)
Calcium: 8.5 mg/dL — ABNORMAL LOW (ref 8.9–10.3)
Chloride: 102 mmol/L (ref 98–111)
Creatinine, Ser: 0.71 mg/dL (ref 0.61–1.24)
GFR, Estimated: >60 mL/min (ref >60)
Glucose, Bld: 110 mg/dL — ABNORMAL HIGH (ref 70–99)
Potassium: 4.1 mmol/L (ref 3.5–5.1)
Sodium: 131 mmol/L — ABNORMAL LOW (ref 135–145)

## 2021-10-03 LAB — TYPE AND SCREEN
ABO/RH(D): O POS
Antibody Screen: POSITIVE
Unit division: 0
Unit division: 0

## 2021-10-03 LAB — CBC
HCT: 33.3 % — ABNORMAL LOW (ref 39.0–52.0)
Hemoglobin: 11.7 g/dL — ABNORMAL LOW (ref 13.0–17.0)
MCH: 33.5 pg (ref 26.0–34.0)
MCHC: 35.1 g/dL (ref 30.0–36.0)
MCV: 95.4 fL (ref 80.0–100.0)
Platelets: 236 10*3/uL (ref 150–400)
RBC: 3.49 MIL/uL — ABNORMAL LOW (ref 4.22–5.81)
RDW: 13.2 % (ref 11.5–15.5)
WBC: 14.8 10*3/uL — ABNORMAL HIGH (ref 4.0–10.5)
nRBC: 0 % (ref 0.0–0.2)

## 2021-10-03 NOTE — Plan of Care (Signed)
  Problem: Education: Goal: Knowledge of General Education information will improve Description: Including pain rating scale, medication(s)/side effects and non-pharmacologic comfort measures Outcome: Progressing   Problem: Activity: Goal: Risk for activity intolerance will decrease Outcome: Progressing   Problem: Nutrition: Goal: Adequate nutrition will be maintained Outcome: Progressing   

## 2021-10-03 NOTE — Plan of Care (Signed)
VSS. C/o pain, given PRN pain meds. OOB to chair w/ walker. Voiding. Call bell within reach. Bed alarm on.   Problem: Clinical Measurements: Goal: Ability to maintain clinical measurements within normal limits will improve Outcome: Progressing Goal: Will remain free from infection Outcome: Progressing Goal: Diagnostic test results will improve Outcome: Progressing Goal: Respiratory complications will improve Outcome: Progressing Goal: Cardiovascular complication will be avoided Outcome: Progressing   Problem: Elimination: Goal: Will not experience complications related to bowel motility Outcome: Progressing Goal: Will not experience complications related to urinary retention Outcome: Progressing   Problem: Pain Managment: Goal: General experience of comfort will improve Outcome: Progressing   Problem: Safety: Goal: Ability to remain free from injury will improve Outcome: Progressing   Problem: Skin Integrity: Goal: Risk for impaired skin integrity will decrease Outcome: Progressing

## 2021-10-03 NOTE — Progress Notes (Signed)
Physical Therapy Treatment Patient Details Name: Kevin Dalton MRN: [id number] DOB: 1956-03-12 Today's Date: 10/03/2021   History of Present Illness Pt is 66 yo male admitted on 10/02/21 for R anterior THA.  Pt with hx including but not limited to HTN, OA, and neurofibromatosis    PT Comments    Pt performed LE HEP and ambulated in room with min guard for safety. Pt LEs fatigue quickly and is unable to maintain terminal knee extension during ambulation. He is progressing well towards goals and current plan remains appropriate.    Recommendations for follow up therapy are one component of a multi-disciplinary discharge planning process, led by the attending physician.  Recommendations may be updated based on patient status, additional functional criteria and insurance authorization.  Follow Up Recommendations  Follow physician's recommendations for discharge plan and follow up therapies (Per MD note - pt lives alone with plan for SNF)     Assistance Recommended at Discharge Frequent or constant Supervision/Assistance  Patient can return home with the following A little help with walking and/or transfers;A little help with bathing/dressing/bathroom;Assistance with cooking/housework;Help with stairs or ramp for entrance   Equipment Recommendations  Rolling walker (2 wheels)    Recommendations for Other Services       Precautions / Restrictions Precautions Precautions: Fall Restrictions Weight Bearing Restrictions: Yes RLE Weight Bearing: Weight bearing as tolerated     Mobility  Bed Mobility Overal bed mobility: Needs Assistance Bed Mobility: Supine to Sit     Supine to sit: Min assist     General bed mobility comments: HOB elevated with use of rails. Light min A for LE management. Pt using UEs to assist in moving R LE.    Transfers Overall transfer level: Needs assistance Equipment used: Rolling walker (2 wheels) Transfers: Sit to/from Stand Sit to Stand: Min guard            General transfer comment: Close min guard for cues for hand placement. Cues required to fully extend LEs, however unable to maintain.    Ambulation/Gait Ambulation/Gait assistance: Min assist Gait Distance (Feet): 30 Feet Assistive device: Rolling walker (2 wheels) Gait Pattern/deviations: Step-to pattern, Decreased stride length, Trunk flexed Gait velocity: decreased     General Gait Details: Noted bil genu valgus (worse on L non-op) and difficulty with terminal knee ext bil in standing.  Ambulates with flexed trunk.  Cues for safety and RW use   Stairs             Wheelchair Mobility    Modified Rankin (Stroke Patients Only)       Balance Overall balance assessment: Needs assistance Sitting-balance support: No upper extremity supported Sitting balance-Leahy Scale: Good     Standing balance support: Bilateral upper extremity supported, Single extremity supported Standing balance-Leahy Scale: Poor Standing balance comment: Requiring at least single UE support; RW to ambulate; grab bar with standing to try to urinate                            Cognition Arousal/Alertness: Awake/alert Behavior During Therapy: WFL for tasks assessed/performed Overall Cognitive Status: Within Functional Limits for tasks assessed                                 General Comments: Required repeated cues, likely [facility]        Exercises Total Joint Exercises Ankle Circles/Pumps: AROM, Both, 10  reps, Supine Quad Sets: AROM, Right, 5 reps, Supine Towel Squeeze: AROM, Both, 5 reps, Supine Heel Slides: AAROM, Right, 5 reps, Supine (Pt using UEs to assist R LE) Hip ABduction/ADduction: AAROM, Right, 5 reps, Supine (gait belt to assist with LE movement) General Exercises - Lower Extremity Hip Flexion/Marching: AAROM, Both, 10 reps, Seated (Assisting R LE with UEs)    General Comments        Pertinent Vitals/Pain Pain Assessment Pain Assessment:  Faces Faces Pain Scale: Hurts a little bit Pain Location: R hip Pain Descriptors / Indicators: Sore Pain Intervention(s): Monitored during session, Limited activity within patient's tolerance, Premedicated before session, Repositioned    Home Living                          Prior Function            PT Goals (current goals can now be found in the care plan section) Acute Rehab PT Goals Patient Stated Goal: improve walking PT Goal Formulation: With patient Time For Goal Achievement: 10/16/21 Potential to Achieve Goals: Good Progress towards PT goals: Progressing toward goals    Frequency    7X/week      PT Plan Current plan remains appropriate    Co-evaluation              AM-PAC PT "6 Clicks" Mobility   Outcome Measure  Help needed turning from your back to your side while in a flat bed without using bedrails?: A Little Help needed moving from lying on your back to sitting on the side of a flat bed without using bedrails?: A Little Help needed moving to and from a bed to a chair (including a wheelchair)?: A Little Help needed standing up from a chair using your arms (e.g., wheelchair or bedside chair)?: A Little Help needed to walk in hospital room?: Total Help needed climbing 3-5 steps with a railing? : Total 6 Click Score: 14    End of Session Equipment Utilized During Treatment: Gait belt Activity Tolerance: Patient tolerated treatment well Patient left: in bed;with call bell/phone within reach;with bed alarm set Nurse Communication: Mobility status PT Visit Diagnosis: Other abnormalities of gait and mobility (R26.89);Muscle weakness (generalized) (M62.81)     Time: 5188-4166 PT Time Calculation (min) (ACUTE ONLY): 24 min  Charges:  $Gait Training: 8-22 mins $Therapeutic Exercise: 8-22 mins                     Benjiman Core, [place] Pager [phone] Acute Rehab  Allena Katz 10/03/2021, 9:50 AM

## 2021-10-03 NOTE — Progress Notes (Signed)
Patient ID: Kevin Dalton, male   DOB: 1956-05-07, 66 y.o.   MRN: [id number]     Subjective: 1 Day Post-Op Procedure(s) (LRB): RIGHT TOTAL HIP ARTHROPLASTY ANTERIOR APPROACH (Right) Awake, alert and oriented x 4. OOB to bedside recliner. Foley is out and he is able to void.  Patient reports pain as mild.    Objective:   VITALS:  Temp:  [97 F (36.1 C)-98.2 F (36.8 C)] 98.2 F (36.8 C) (02/25 0958) Pulse Rate:  [54-91] 91 (02/25 0958) Resp:  [8-19] 16 (02/25 0958) BP: (106-138)/(79-94) 106/80 (02/25 0958) SpO2:  [90 %-100 %] 99 % (02/25 0958)  Neurologically intact ABD soft Neurovascular intact Sensation intact distally Intact pulses distally Dorsiflexion/Plantar flexion intact Incision: dressing C/D/I and no drainage   LABS Recent Labs    10/03/21 0108  HGB 11.7*  WBC 14.8*  PLT 236   Recent Labs    10/03/21 0108  NA 131*  K 4.1  CL 102  CO2 21*  BUN 9  CREATININE 0.71  GLUCOSE 110*   No results for input(s): LABPT, INR in the last 72 hours.   Assessment/Plan: 1 Day Post-Op Procedure(s) (LRB): RIGHT TOTAL HIP ARTHROPLASTY ANTERIOR APPROACH (Right)  Advance diet Up with therapy D/C IV fluids Discharge to SNF  Basil Dess 10/03/2021, 10:48 AM

## 2021-10-04 LAB — CBC
HCT: 31.9 % — ABNORMAL LOW (ref 39.0–52.0)
Hemoglobin: 11.1 g/dL — ABNORMAL LOW (ref 13.0–17.0)
MCH: 33.2 pg (ref 26.0–34.0)
MCHC: 34.8 g/dL (ref 30.0–36.0)
MCV: 95.5 fL (ref 80.0–100.0)
Platelets: 209 10*3/uL (ref 150–400)
RBC: 3.34 MIL/uL — ABNORMAL LOW (ref 4.22–5.81)
RDW: 13.3 % (ref 11.5–15.5)
WBC: 13.3 10*3/uL — ABNORMAL HIGH (ref 4.0–10.5)
nRBC: 0 % (ref 0.0–0.2)

## 2021-10-04 NOTE — Anesthesia Postprocedure Evaluation (Signed)
Anesthesia Post Note  Patient: Kevin Dalton  Procedure(s) Performed: RIGHT TOTAL HIP ARTHROPLASTY ANTERIOR APPROACH (Right: Hip)     Patient location during evaluation: PACU Anesthesia Type: General Level of consciousness: awake and alert Pain management: pain level controlled Vital Signs Assessment: post-procedure vital signs reviewed and stable Respiratory status: spontaneous breathing, nonlabored ventilation, respiratory function stable and patient connected to nasal cannula oxygen Cardiovascular status: blood pressure returned to baseline and stable Postop Assessment: no apparent nausea or vomiting Anesthetic complications: no   No notable events documented.  Last Vitals:  Vitals:   10/04/21 0554 10/04/21 0831  BP: 119/81 106/73  Pulse: 96 94  Resp: 14 13  Temp: 37.1 C 36.7 C  SpO2: 98% 100%    Last Pain:  Vitals:   10/04/21 0831  TempSrc: Oral  PainSc: 0-No pain                 Aseneth Hack

## 2021-10-04 NOTE — Progress Notes (Signed)
Physical Therapy Treatment Patient Details Name: Kevin Dalton MRN: [id number] DOB: 06/07/1956 Today's Date: 10/04/2021   History of Present Illness Pt is 66 yo male admitted on 10/02/21 for R anterior THA.  Pt with hx including but not limited to HTN, OA, and neurofibromatosis    PT Comments    Continuing work on functional mobility and activity tolerance;  Able to participate in full set of R hip AROM therapeutic exercises, and walk in the room afterward; slow, but steady progress; Overall progressing well; Anticipate continuing good progress at post-acute rehabilitation.    Recommendations for follow up therapy are one component of a multi-disciplinary discharge planning process, led by the attending physician.  Recommendations may be updated based on patient status, additional functional criteria and insurance authorization.  Follow Up Recommendations  Follow physician's recommendations for discharge plan and follow up therapies (Per MD note - pt lives alone with plan for SNF)     Assistance Recommended at Discharge Frequent or constant Supervision/Assistance  Patient can return home with the following A little help with walking and/or transfers;A little help with bathing/dressing/bathroom;Assistance with cooking/housework;Help with stairs or ramp for entrance   Equipment Recommendations  Rolling walker (2 wheels)    Recommendations for Other Services       Precautions / Restrictions Precautions Precautions: Fall Restrictions RLE Weight Bearing: Weight bearing as tolerated     Mobility  Bed Mobility Overal bed mobility: Needs Assistance Bed Mobility: Supine to Sit     Supine to sit: Min assist     General bed mobility comments: Cues for technqiue; slow moving    Transfers Overall transfer level: Needs assistance Equipment used: Rolling walker (2 wheels) Transfers: Sit to/from Stand Sit to Stand: Min guard           General transfer comment: Close min guard  for cues for hand placement. Cues required to fully extend LEs, however unable to maintain. Stood from EOB and from 3in1    Ambulation/Gait Ambulation/Gait assistance: Herbalist (Feet): 15 Feet (x2) Assistive device: Rolling walker (2 wheels) Gait Pattern/deviations: Step-to pattern, Decreased stride length, Trunk flexed Gait velocity: decreased     General Gait Details: Noted bil genu valgus (worse on L non-op) and difficulty with terminal knee ext bil in standing.  Ambulates with flexed trunk.  Cues for safety and RW use   Stairs             Wheelchair Mobility    Modified Rankin (Stroke Patients Only)       Balance     Sitting balance-Leahy Scale: Good       Standing balance-Leahy Scale: Poor                              Cognition Arousal/Alertness: Awake/alert Behavior During Therapy: WFL for tasks assessed/performed Overall Cognitive Status: Within Functional Limits for tasks assessed                                 General Comments: Required repeated cues, likely [facility]        Exercises Total Joint Exercises Ankle Circles/Pumps: AROM, Both, 10 reps, Supine Towel Squeeze: AROM, Both, 10 reps Heel Slides: AROM, Right, 10 reps Hip ABduction/ADduction: AAROM, Right, 10 reps Bridges: AROM, Both, 10 reps    General Comments        Pertinent Vitals/Pain Pain Assessment Pain Assessment:  0-10 Pain Score: 9  Pain Location: R hip Pain Descriptors / Indicators: Sore Pain Intervention(s): Monitored during session    Home Living                          Prior Function            PT Goals (current goals can now be found in the care plan section) Acute Rehab PT Goals Patient Stated Goal: improve walking PT Goal Formulation: With patient Time For Goal Achievement: 10/16/21 Potential to Achieve Goals: Good Progress towards PT goals: Progressing toward goals (slwoly)    Frequency     7X/week      PT Plan Current plan remains appropriate    Co-evaluation              AM-PAC PT "6 Clicks" Mobility   Outcome Measure  Help needed turning from your back to your side while in a flat bed without using bedrails?: A Little Help needed moving from lying on your back to sitting on the side of a flat bed without using bedrails?: A Little Help needed moving to and from a bed to a chair (including a wheelchair)?: A Little Help needed standing up from a chair using your arms (e.g., wheelchair or bedside chair)?: A Little Help needed to walk in hospital room?: Total Help needed climbing 3-5 steps with a railing? : Total 6 Click Score: 14    End of Session   Activity Tolerance: Patient tolerated treatment well Patient left: in chair;with call bell/phone within reach   PT Visit Diagnosis: Other abnormalities of gait and mobility (R26.89);Muscle weakness (generalized) (M62.81)     Time: 5956-3875 PT Time Calculation (min) (ACUTE ONLY): 32 min  Charges:  $Gait Training: 8-22 mins $Therapeutic Exercise: 8-22 mins                     Roney Marion, PT  Acute Rehabilitation Services Pager [phone] Office [place] 10/04/2021, 12:16 PM

## 2021-10-04 NOTE — Progress Notes (Signed)
° ° ° °  Subjective: 2 Days Post-Op Procedure(s) (LRB): RIGHT TOTAL HIP ARTHROPLASTY ANTERIOR APPROACH (Right) Awake, alert and oriented x 4, In good spirits, undergoing SNF placement. Working with PT and OT, no complaints.   Patient reports pain as moderate.    Objective:   VITALS:  Temp:  [98 F (36.7 C)-98.9 F (37.2 C)] 98 F (36.7 C) (02/26 0831) Pulse Rate:  [85-96] 94 (02/26 0831) Resp:  [13-14] 13 (02/26 0831) BP: (106-119)/(73-81) 106/73 (02/26 0831) SpO2:  [97 %-100 %] 100 % (02/26 0831)  Neurologically intact ABD soft Neurovascular intact Sensation intact distally Intact pulses distally Dorsiflexion/Plantar flexion intact Incision: dressing C/D/I and no drainage   LABS Recent Labs    10/03/21 0108 10/04/21 0101  HGB 11.7* 11.1*  WBC 14.8* 13.3*  PLT 236 209   Recent Labs    10/03/21 0108  NA 131*  K 4.1  CL 102  CO2 21*  BUN 9  CREATININE 0.71  GLUCOSE 110*   No results for input(s): LABPT, INR in the last 72 hours.   Assessment/Plan: 2 Days Post-Op Procedure(s) (LRB): RIGHT TOTAL HIP ARTHROPLASTY ANTERIOR APPROACH (Right)  Advance diet Up with therapy Discharge to SNF  Basil Dess 10/04/2021, 12:54 PM Patient ID: Kevin Dalton, male   DOB: [birth year], 66 y.o.   MRN: [id number]

## 2021-10-05 ENCOUNTER — Telehealth: Payer: Self-pay

## 2021-10-05 ENCOUNTER — Encounter (HOSPITAL_COMMUNITY): Payer: Self-pay | Admitting: Orthopaedic Surgery

## 2021-10-05 MED ORDER — BISACODYL 10 MG RE SUPP
10.0000 mg | Freq: Once | RECTAL | Status: AC
  Administered 2021-10-05: 10 mg via RECTAL
  Filled 2021-10-05: qty 1

## 2021-10-05 MED ORDER — ASPIRIN 325 MG PO TBEC: 325.0000 mg | DELAYED_RELEASE_TABLET | Freq: Every day | ORAL | 0 refills | Status: DC

## 2021-10-05 MED ORDER — OXYCODONE-ACETAMINOPHEN 5-325 MG PO TABS: 1.0000 | ORAL_TABLET | ORAL | 0 refills | Status: DC | PRN

## 2021-10-05 MED ORDER — METHOCARBAMOL 500 MG PO TABS: 500.0000 mg | ORAL_TABLET | Freq: Four times a day (QID) | ORAL | 0 refills | Status: DC | PRN

## 2021-10-05 NOTE — Care Management Important Message (Signed)
Important Message  Patient Details  Name: Kevin Dalton MRN: [id number] Date of Birth: 08-03-1956   Medicare Important Message Given:  Yes     Hannah Beat 10/05/2021, 12:20 PM

## 2021-10-05 NOTE — Telephone Encounter (Signed)
Noted.  Appt cancelled.

## 2021-10-05 NOTE — Discharge Summary (Addendum)
Patient ID: Kevin Dalton MRN: [id number] DOB/AGE: 1956/03/26 66 y.o.  Admit date: 10/02/2021 Discharge date: 10/07/2021  Admission Diagnoses:  Principal Problem:   Arthritis of right hip Active Problems:   Unilateral primary osteoarthritis, right hip   Discharge Diagnoses:  Principal Problem:   Arthritis of right hip Active Problems:   Unilateral primary osteoarthritis, right hip  status post Procedure(s): RIGHT TOTAL HIP ARTHROPLASTY ANTERIOR APPROACH  Past Medical History:  Diagnosis Date   Hypertension    Neurofibromatosis     Surgeries: Procedure(s): RIGHT TOTAL HIP ARTHROPLASTY ANTERIOR APPROACH on 10/02/2021   Consultants:   Discharged Condition: Improved  Hospital Course: Kevin Dalton is an 66 y.o. male who was admitted 10/02/2021 for operative treatment of Arthritis of right hip. Patient failed conservative treatments (please see the history and physical for the specifics) and had severe unremitting pain that affects sleep, daily activities and work/hobbies. After pre-op clearance, the patient was taken to the operating room on 10/02/2021 and underwent  Procedure(s): RIGHT TOTAL HIP ARTHROPLASTY ANTERIOR APPROACH.    Patient was given perioperative antibiotics:  Anti-infectives (From admission, onward)    Start     Dose/Rate Route Frequency Ordered Stop   10/02/21 0930  ceFAZolin (ANCEF) IVPB 2g/100 mL premix        2 g 200 mL/hr over 30 Minutes Intravenous On call to O.R. 10/02/21 6767 10/02/21 1048        Patient was given sequential compression devices and early ambulation to prevent DVT.   Patient benefited maximally from hospital stay and there were no complications. At the time of discharge, the patient was urinating/moving their bowels without difficulty, tolerating a regular diet, pain is controlled with oral pain medications and they have been cleared by PT/OT.   October 05, 2021 patient doing well.  Pain controlled.  Ready for transfer to skilled  facility.  October 07, 2021.  Doing well.  No complaints.  Wound looks good.  No drainage or signs of infection. Ready for transfer to snf.   Recent vital signs: Patient Vitals for the past 24 hrs:  BP Temp Temp src Pulse Resp SpO2  10/05/21 0439 -- 98.6 F (37 C) Oral -- 18 --  10/04/21 2025 110/78 98 F (36.7 C) Oral 92 16 98 %     Recent laboratory studies:  Recent Labs    10/03/21 0108 10/04/21 0101  WBC 14.8* 13.3*  HGB 11.7* 11.1*  HCT 33.3* 31.9*  PLT 236 209  NA 131*  --   K 4.1  --   CL 102  --   CO2 21*  --   BUN 9  --   CREATININE 0.71  --   GLUCOSE 110*  --   CALCIUM 8.5*  --      Discharge Medications:   Allergies as of 10/05/2021   No Known Allergies      Medication List     STOP taking these medications    traMADol 50 MG tablet Commonly known as: ULTRAM       TAKE these medications    amLODipine 5 MG tablet Commonly known as: NORVASC Take 5 mg by mouth daily.   aspirin 325 MG EC tablet Take 1 tablet (325 mg total) by mouth daily with breakfast. Start taking on: October 06, 2021   benazepril 10 MG tablet Commonly known as: LOTENSIN Take 10 mg by mouth daily.   methocarbamol 500 MG tablet Commonly known as: ROBAXIN Take 1 tablet (500 mg total) by mouth  every 6 (six) hours as needed for muscle spasms.   oxyCODONE-acetaminophen 5-325 MG tablet Commonly known as: PERCOCET/ROXICET Take 1 tablet by mouth every 4 (four) hours as needed for severe pain.        Diagnostic Studies: DG C-Arm 1-60 Min-No Report  Result Date: 10/02/2021 Fluoroscopy was utilized by the requesting physician.  No radiographic interpretation.   DG C-Arm 1-60 Min-No Report  Result Date: 10/02/2021 Fluoroscopy was utilized by the requesting physician.  No radiographic interpretation.   DG HIP UNILAT WITH PELVIS 1V RIGHT  Result Date: 10/02/2021 CLINICAL DATA:  Right hip arthroplasty. EXAM: DG HIP (WITH OR WITHOUT PELVIS) 1V RIGHT COMPARISON:  None.  FINDINGS: Single AP image of the lower pelvis demonstrates evidence of patient's recent right total hip arthroplasty which appears intact and normally located. Remainder the exam is unremarkable. IMPRESSION: Expected changes post right total hip arthroplasty. Electronically Signed   By: Marin Olp M.D.   On: 10/02/2021 13:09   DG Hip Port Unilat With Pelvis 1V Right  Result Date: 10/02/2021 CLINICAL DATA:  Right hip arthroplasty EXAM: DG HIP (WITH OR WITHOUT PELVIS) 1V PORT RIGHT COMPARISON:  None. FINDINGS: Postsurgical changes from right total hip arthroplasty. Arthroplasty components are in their expected alignment. No periprosthetic fracture or evidence of other complication. Expected postoperative changes within the overlying soft tissues. Numerous nodular soft tissue densities throughout the pelvis and visualized lower extremities compatible with known history of neurofibromatosis. IMPRESSION: Satisfactory postoperative appearance status post right total hip arthroplasty. Electronically Signed   By: Davina Poke D.O.   On: 10/02/2021 14:28       Follow-up Information     Marybelle Killings, MD Follow up today.   Specialty: Orthopedic Surgery Why: NEEDS RETURN OFFICE VISIT 2 WEEKS POSTOP WITH DR Lorin Mercy.  CALL TO SCHEDULE APPOINTMENT. Contact information: [address] [place] [address] [phone]                 Discharge Plan:  discharge to SNF  Disposition:     Signed: Benjiman Core  10/05/2021, 12:15 PM

## 2021-10-05 NOTE — Progress Notes (Addendum)
Subjective: Pain controlled right hip.  Complains of not having a bowel movement yet.  Appetite is good.  Denies nausea, vomiting, abdominal pain.  No flatus.   Objective: Vital signs in last 24 hours: Temp:  [98 F (36.7 C)-98.6 F (37 C)] 98.6 F (37 C) (02/27 0439) Pulse Rate:  [92] 92 (02/26 2025) Resp:  [16-18] 18 (02/27 0439) BP: (110)/(78) 110/78 (02/26 2025) SpO2:  [98 %] 98 % (02/26 2025)  Intake/Output from previous day: 02/26 0701 - 02/27 0700 In: -  Out: 200 [Urine:200] Intake/Output this shift: No intake/output data recorded.  Recent Labs    10/03/21 0108 10/04/21 0101  HGB 11.7* 11.1*   Recent Labs    10/03/21 0108 10/04/21 0101  WBC 14.8* 13.3*  RBC 3.49* 3.34*  HCT 33.3* 31.9*  PLT 236 209   Recent Labs    10/03/21 0108  NA 131*  K 4.1  CL 102  CO2 21*  BUN 9  CREATININE 0.71  GLUCOSE 110*  CALCIUM 8.5*   No results for input(s): LABPT, INR in the last 72 hours.  Exam [name] alert and oriented in no acute distress.  Abdomen soft, nondistended, nontender.    Assessment/Plan: We will  Dulcolax suppository.  Mag citrate is currently unavailable throughout the hospital due to recall .  anticipate discharge to skilled facility today.     Kevin Dalton 10/05/2021, 12:17 PM

## 2021-10-05 NOTE — TOC Initial Note (Addendum)
Transition of Care [facility]) - Initial/Assessment Note    Patient Details  Name: Kevin Dalton MRN: [id number] Date of Birth: December 05, 1955  [facility] CM/SW Contact:    Joanne Chars, LCSW Phone Number: 10/05/2021, 9:48 AM  Clinical Narrative:    CSW spoke with pt regarding DC recommendation for SNF.  Pt agreeable, choice document given, permission given to send out referral in hub, permission given to speak with sister Thayer Headings.  Pt from [place], wants facility in [place].  Referral sent out for SNF.               1200: Bed offers given to pt, he accepts offer at [facility].  Pt also informed CSW that his [place] medicare expires on 2/28 and he will be covered by [facility] as of 3/1.  CSW called [employer] and the confirmed that pt does show as having Barrie Dunker, managed by [facility] for SNF auth, effective 3/1.  CSW spoke with Marianna Fuss at [facility] and informed her.  Expected Discharge Plan: Skilled Nursing Facility Barriers to Discharge: Continued Medical Work up, SNF Pending bed offer   Patient Goals and CMS Choice Patient states their goals for this hospitalization and ongoing recovery are:: "walk normal" CMS Medicare.gov Compare Post Acute Care list provided to:: Patient Choice offered to / list presented to : Patient  Expected Discharge Plan and Services Expected Discharge Plan: [place] In-house Referral: Clinical Social Work   Post Acute Care Choice: [place] Living arrangements for the past 2 months: [place]                                      Prior Living Arrangements/Services Living arrangements for the past 2 months: Single Family Home Lives with:: Self Patient language and need for interpreter reviewed:: Yes Do you feel safe going back to the place where you live?: Yes      Need for Family Participation in Patient Care: No (Comment) Care giver support system in place?: Yes (comment) Current  home services: Other (comment) (none) Criminal Activity/Legal Involvement Pertinent to Current Situation/Hospitalization: No - Comment as needed  Activities of Daily Living Home Assistive Devices/Equipment: Cane (specify quad or straight) ADL Screening (condition at time of admission) Patient's cognitive ability adequate to safely complete daily activities?: Yes Is the patient deaf or have difficulty hearing?: No Does the patient have difficulty seeing, even when wearing glasses/contacts?: No Does the patient have difficulty concentrating, remembering, or making decisions?: No Patient able to express need for assistance with ADLs?: Yes Does the patient have difficulty dressing or bathing?: No Independently performs ADLs?: Yes (appropriate for developmental age) Does the patient have difficulty walking or climbing stairs?: No Weakness of Legs: None Weakness of Arms/Hands: None  Permission Sought/Granted Permission sought to share information with : Family Supports Permission granted to share information with : Yes, Verbal Permission Granted  Share Information with NAME: sister Thayer Headings  Permission granted to share info w AGENCY: SNF        Emotional Assessment Appearance:: Appears stated age Attitude/Demeanor/Rapport: Engaged Affect (typically observed): Appropriate, Pleasant Orientation: : Oriented to Self, Oriented to Place, Oriented to  Time, Oriented to Situation Alcohol / Substance Use: Not Applicable Psych Involvement: No (comment)  Admission diagnosis:  Arthritis of right hip [M16.11] Patient Active Problem List   Diagnosis Date Noted   Arthritis of right hip 10/02/2021  Neurofibromatosis ([place]) 08/27/2021   Unilateral primary osteoarthritis, right hip 08/27/2021   Other closed fractures of upper end of humerus 06/01/2011   Pain in joint, shoulder region 06/01/2011   Muscle weakness (generalized) 06/01/2011   PCP:  Neale Burly, MD Pharmacy:   [place] [place] [address] Phone: [phone] Fax: [phone]  [facility] [address] [place] [address] Phone: [phone] Fax: [phone]     Social Determinants of Health (SDOH) Interventions    Readmission Risk Interventions No flowsheet data found.

## 2021-10-05 NOTE — Telephone Encounter (Signed)
Patient Is currently in the hospital and will not be able to attend his apt Thursday in [place] with Dr. Lorin Mercy and wanted to [facility]

## 2021-10-05 NOTE — Discharge Instructions (Addendum)
INSTRUCTIONS AFTER JOINT REPLACEMENT   Remove items at home which could result in a fall. This includes throw rugs or furniture in walking pathways ICE to the affected joint every three hours while awake for 30 minutes at a time, for at least the first 3-5 days, and then as needed for pain and swelling.  Continue to use ice for pain and swelling. You may notice swelling that will progress down to the foot and ankle.  This is normal after surgery.  Elevate your leg when you are not up walking on it.   Continue to use the breathing machine you got in the hospital (incentive spirometer) which will help keep your temperature down.  It is common for your temperature to cycle up and down following surgery, especially at night when you are not up moving around and exerting yourself.  The breathing machine keeps your lungs expanded and your temperature down.   DIET:  As you were doing prior to hospitalization, we recommend a well-balanced diet.  DRESSING / WOUND CARE / SHOWERING  Okay to shower without dressing on.  Needs daily dressing changes and wound checks by RN.  Can use 4 x 4 gauze and tape.  Do not apply any creams or ointments to incision.  ACTIVITY  Increase activity slowly as tolerated, but follow the weight bearing instructions below.   No driving for 6 weeks or until further direction given by your physician.  You cannot drive while taking narcotics.  No lifting or carrying greater than 10 lbs. until further directed by your surgeon. Avoid periods of inactivity such as sitting longer than an hour when not asleep. This helps prevent blood clots.  You may return to work once you are authorized by your doctor.     WEIGHT BEARING   Weight bearing as tolerated with assist device (walker, cane, etc) as directed, use it as long as suggested by your surgeon or therapist, typically at least 4-6 weeks.   EXERCISES  Results after joint replacement surgery are often greatly improved when you  follow the exercise, range of motion and muscle strengthening exercises prescribed by your doctor. Safety measures are also important to protect the joint from further injury. Any time any of these exercises cause you to have increased pain or swelling, decrease what you are doing until you are comfortable again and then slowly increase them. If you have problems or questions, call your caregiver or physical therapist for advice.   Rehabilitation is important following a joint replacement. After just a few days of immobilization, the muscles of the leg can become weakened and shrink (atrophy).  These exercises are designed to build up the tone and strength of the thigh and leg muscles and to improve motion. Often times heat used for twenty to thirty minutes before working out will loosen up your tissues and help with improving the range of motion but do not use heat for the first two weeks following surgery (sometimes heat can increase post-operative swelling).    A rehabilitation program following joint replacement surgery can speed recovery and prevent re-injury in the future due to weakened muscles. Contact your doctor or a physical therapist for more information on knee rehabilitation.    CONSTIPATION  Constipation is defined medically as fewer than three stools per week and severe constipation as less than one stool per week.  Even if you have a regular bowel pattern at home, your normal regimen is likely to be disrupted due to multiple reasons following surgery.  Combination of anesthesia, postoperative narcotics, change in appetite and fluid intake all can affect your bowels.   YOU MUST use at least one of the following options; they are listed in order of increasing strength to get the job done.  They are all available over the counter, and you may need to use some, POSSIBLY even all of these options:    Drink plenty of fluids (prune juice may be helpful) and high fiber foods Colace 100 mg by  mouth twice a day  Senokot for constipation as directed and as needed Dulcolax (bisacodyl), take with full glass of water  Miralax (polyethylene glycol) once or twice a day as needed.  If you have tried all these things and are unable to have a bowel movement in the first 3-4 days after surgery call either your surgeon or your primary doctor.    If you experience loose stools or diarrhea, hold the medications until you stool forms back up.  If your symptoms do not get better within 1 week or if they get worse, check with your doctor.  If you experience "the worst abdominal pain ever" or develop nausea or vomiting, please contact the office immediately for further recommendations for treatment.   ITCHING:  If you experience itching with your medications, try taking only a single pain pill, or even half a pain pill at a time.  You can also use Benadryl over the counter for itching or also to help with sleep.   TED HOSE STOCKINGS:  Use stockings on both legs until for at least 2 weeks or as directed by physician office. They may be removed at night for sleeping.  MEDICATIONS:  See your medication summary on the After Visit Summary that nursing will review with you.  You may have some home medications which will be placed on hold until you complete the course of blood thinner medication.  It is important for you to complete the blood thinner medication as prescribed.  PRECAUTIONS:  If you experience chest pain or shortness of breath - call 911 immediately for transfer to the hospital emergency department.   If you develop a fever greater that 101 F, purulent drainage from wound, increased redness or drainage from wound, foul odor from the wound/dressing, or calf pain - CONTACT YOUR SURGEON.                                                   FOLLOW-UP APPOINTMENTS:  If you do not already have a post-op appointment, please call the office for an appointment to be seen by your surgeon.  Guidelines for  how soon to be seen are listed in your After Visit Summary, but are typically between 1-4 weeks after surgery.   POST-OPERATIVE OPIOID TAPER INSTRUCTIONS: It is important to wean off of your opioid medication as soon as possible. If you do not need pain medication after your surgery it is ok to stop day one. Opioids include: Codeine, Hydrocodone(Norco, Vicodin), Oxycodone(Percocet, oxycontin) and hydromorphone amongst others.  Long term and even short term use of opiods can cause: Increased pain response Dependence Constipation Depression Respiratory depression And more.  Withdrawal symptoms can include Flu like symptoms Nausea, vomiting And more Techniques to manage these symptoms Hydrate well Eat regular healthy meals Stay active Use relaxation techniques(deep breathing, meditating, yoga) Do Not substitute Alcohol to help  with tapering If you have been on opioids for less than two weeks and do not have pain than it is ok to stop all together.  Plan to wean off of opioids This plan should start within one week post op of your joint replacement. Maintain the same interval or time between taking each dose and first decrease the dose.  Cut the total daily intake of opioids by one tablet each day Next start to increase the time between doses. The last dose that should be eliminated is the evening dose.   MAKE SURE YOU:  Understand these instructions.  Get help right away if you are not doing well or get worse.    Thank you for letting us be a part of your medical care team.  It is a privilege we respect greatly.  We hope these instructions will help you stay on track for a fast and full recovery!   Dental Antibiotics:  In most cases prophylactic antibiotics for Dental procdeures after total joint surgery are not necessary.  Exceptions are as follows:  1. History of prior total joint infection  2. Severely immunocompromised (Organ Transplant, cancer chemotherapy,  Rheumatoid biologic meds such as [place])  3. Poorly controlled diabetes (A1C &gt; 8.0, blood glucose over 200)  If you have one of these conditions, contact your surgeon for an antibiotic prescription, prior to your dental procedure.

## 2021-10-05 NOTE — Progress Notes (Signed)
Physical Therapy Treatment Patient Details Name: Kevin Dalton MRN: [id number] DOB: 10/06/1955 Today's Date: 10/05/2021   History of Present Illness Pt is 66 yo male admitted on 10/02/21 for R anterior THA.  Pt with hx including but not limited to HTN, OA, and neurofibromatosis    PT Comments    Pt progressing towards physical therapy goals. Was able to progress ambulation in the hallway. Would prefer pt has staff supervision with OOB mobility to optimize safety. Discussed this with pt and placed bed alarm. Will continue to follow and progress as able per POC.     Recommendations for follow up therapy are one component of a multi-disciplinary discharge planning process, led by the attending physician.  Recommendations may be updated based on patient status, additional functional criteria and insurance authorization.  Follow Up Recommendations  Follow physician's recommendations for discharge plan and follow up therapies (Per MD note - pt lives alone with plan for SNF)     Assistance Recommended at Discharge Frequent or constant Supervision/Assistance  Patient can return home with the following A little help with walking and/or transfers;A little help with bathing/dressing/bathroom;Assistance with cooking/housework;Help with stairs or ramp for entrance   Equipment Recommendations  Rolling walker (2 wheels)    Recommendations for Other Services       Precautions / Restrictions Precautions Precautions: Fall Restrictions Weight Bearing Restrictions: Yes RLE Weight Bearing: Weight bearing as tolerated     Mobility  Bed Mobility Overal bed mobility: Needs Assistance Bed Mobility: Sit to Supine       Sit to supine: Min guard   General bed mobility comments: Increased time but overall able to transition back to bed and scoot up to [facility] without assist.    Transfers Overall transfer level: Needs assistance Equipment used: Rolling walker (2 wheels) Transfers: Sit to/from  Stand Sit to Stand: Min guard           General transfer comment: Close min guard for cues for hand placement. Cues required to fully extend LEs, however unable to maintain.    Ambulation/Gait Ambulation/Gait assistance: Min guard Gait Distance (Feet): 100 Feet Assistive device: Rolling walker (2 wheels) Gait Pattern/deviations: Step-to pattern, Decreased stride length, Trunk flexed Gait velocity: decreased Gait velocity interpretation: <1.31 ft/sec, indicative of household ambulator   General Gait Details: Noted bil genu valgus (worse on L non-operative side) and difficulty with terminal knee ext bil in standing.  Ambulates with flexed trunk.  Cues for increased heel strike, closer walker proximity, and forward gaze.   Stairs             Wheelchair Mobility    Modified Rankin (Stroke Patients Only)       Balance Overall balance assessment: Needs assistance Sitting-balance support: No upper extremity supported Sitting balance-Leahy Scale: Good     Standing balance support: Bilateral upper extremity supported, Single extremity supported Standing balance-Leahy Scale: Poor                              Cognition Arousal/Alertness: Awake/alert Behavior During Therapy: WFL for tasks assessed/performed Overall Cognitive Status: Within Functional Limits for tasks assessed                                          Exercises Total Joint Exercises Short Arc Quad: AAROM, 10 reps, Right Hip ABduction/ADduction: AAROM, Right, 10  reps Long Arc Quad: AROM, 10 reps, Right    General Comments        Pertinent Vitals/Pain Pain Assessment Pain Assessment: Faces Faces Pain Scale: Hurts a little bit Pain Location: R hip Pain Descriptors / Indicators: Sore, Operative site guarding Pain Intervention(s): Limited activity within patient's tolerance, Monitored during session, Repositioned    Home Living                           Prior Function            PT Goals (current goals can now be found in the care plan section) Acute Rehab PT Goals Patient Stated Goal: improve walking PT Goal Formulation: With patient Time For Goal Achievement: 10/16/21 Potential to Achieve Goals: Good Progress towards PT goals: Progressing toward goals    Frequency    7X/week      PT Plan Current plan remains appropriate    Co-evaluation              AM-PAC PT "6 Clicks" Mobility   Outcome Measure  Help needed turning from your back to your side while in a flat bed without using bedrails?: A Little Help needed moving from lying on your back to sitting on the side of a flat bed without using bedrails?: A Little Help needed moving to and from a bed to a chair (including a wheelchair)?: A Little Help needed standing up from a chair using your arms (e.g., wheelchair or bedside chair)?: A Little Help needed to walk in hospital room?: A Little Help needed climbing 3-5 steps with a railing? : A Lot 6 Click Score: 17    End of Session Equipment Utilized During Treatment: Gait belt Activity Tolerance: Patient tolerated treatment well Patient left: in chair;with call bell/phone within reach Nurse Communication: Mobility status PT Visit Diagnosis: Other abnormalities of gait and mobility (R26.89);Muscle weakness (generalized) (M62.81)     Time: 6979-4801 PT Time Calculation (min) (ACUTE ONLY): 20 min  Charges:  $Gait Training: 8-22 mins                     Kevin Dalton, PT, DPT Acute Rehabilitation Services Pager: [phone] Office: [phone]    Kevin Dalton 10/05/2021, 9:53 AM

## 2021-10-05 NOTE — Progress Notes (Signed)
Mobility Specialist Progress Note    10/05/21 1423  Mobility  Activity Ambulated with assistance in hallway  Level of Assistance Contact guard assist, steadying assist  Assistive Device Front wheel walker  RLE Weight Bearing WBAT  Distance Ambulated (ft) 180 ft  Activity Response Tolerated fair  $Mobility charge 1 Mobility   Pt received standing in room and agreeable. No complaints. Returned to sitting EOB with call bell in reach.   [facility] Mobility Specialist  M.S. 5N: [phone]

## 2021-10-06 NOTE — Progress Notes (Signed)
Physical Therapy Treatment Patient Details Name: Kevin Dalton MRN: [id number] DOB: April 17, 1956 Today's Date: 10/06/2021   History of Present Illness Pt is 66 yo male admitted on 10/02/21 for R anterior THA.  Pt with hx including but not limited to HTN, OA, and neurofibromatosis    PT Comments    Pt received OOB in chair upon arrival and agreeable to session fwith good tolerance for progression of LE exercise for increased ROM and strength, pt had just returned to room from long independent hallway ambulation. Pt with good tolerance for gait training with cues for upright posture, increased foot clearance and increased heel strike with pt able to correct but unable to maintain. Reiterated having staff supervision during OOB mobility to optimize safety. Pt continues to benefit from skilled PT services to progress toward functional mobility goals.    Recommendations for follow up therapy are one component of a multi-disciplinary discharge planning process, led by the attending physician.  Recommendations may be updated based on patient status, additional functional criteria and insurance authorization.  Follow Up Recommendations  Follow physician's recommendations for discharge plan and follow up therapies (Per MD note - pt lives alone with plan for SNF)     Assistance Recommended at Discharge Frequent or constant Supervision/Assistance  Patient can return home with the following A little help with walking and/or transfers;A little help with bathing/dressing/bathroom;Assistance with cooking/housework;Help with stairs or ramp for entrance   Equipment Recommendations  Rolling walker (2 wheels)    Recommendations for Other Services       Precautions / Restrictions Precautions Precautions: Fall Restrictions Weight Bearing Restrictions: Yes RLE Weight Bearing: Weight bearing as tolerated     Mobility  Bed Mobility               General bed mobility comments: OOB in recliner upon  arrival    Transfers Overall transfer level: Needs assistance Equipment used: Rolling walker (2 wheels) Transfers: Sit to/from Stand Sit to Stand: Min guard           General transfer comment: min guard for safety    Ambulation/Gait Ambulation/Gait assistance: Min guard Gait Distance (Feet): 25 Feet Assistive device: Rolling walker (2 wheels) Gait Pattern/deviations: Trunk flexed, Step-through pattern, Decreased step length - right, Decreased step length - left Gait velocity: decreased     General Gait Details: Noted bil genu valgus (worse on L non-operative side) and difficulty with terminal knee ext bil in standing.  Ambulates with flexed trunk.  Cues for increased heel strike, closer walker proximity, and forward gaze.   Stairs             Wheelchair Mobility    Modified Rankin (Stroke Patients Only)       Balance Overall balance assessment: Needs assistance Sitting-balance support: No upper extremity supported Sitting balance-Leahy Scale: Good     Standing balance support: Bilateral upper extremity supported, Single extremity supported Standing balance-Leahy Scale: Poor                              Cognition Arousal/Alertness: Awake/alert Behavior During Therapy: WFL for tasks assessed/performed Overall Cognitive Status: Within Functional Limits for tasks assessed                                          Exercises Total Joint Exercises Hip ABduction/ADduction: AROM, Right, 10  reps, Standing Long Arc Quad: AROM, Right, 10 reps, Seated Knee Flexion: AROM, Right, 10 reps, Standing Marching in Standing: AROM, Right, 10 reps, Standing Standing Hip Extension: AROM, Right, 10 reps, Standing    General Comments        Pertinent Vitals/Pain Pain Assessment Pain Assessment: Faces Faces Pain Scale: Hurts a little bit Pain Location: R hip Pain Descriptors / Indicators: Sore, Operative site guarding Pain Intervention(s):  Limited activity within patient's tolerance, Monitored during session, Repositioned    Home Living                          Prior Function            PT Goals (current goals can now be found in the care plan section) Acute Rehab PT Goals Patient Stated Goal: improve walking PT Goal Formulation: With patient Time For Goal Achievement: 10/16/21 Potential to Achieve Goals: Good    Frequency    7X/week      PT Plan Current plan remains appropriate    Co-evaluation              AM-PAC PT "6 Clicks" Mobility   Outcome Measure  Help needed turning from your back to your side while in a flat bed without using bedrails?: A Little Help needed moving from lying on your back to sitting on the side of a flat bed without using bedrails?: A Little Help needed moving to and from a bed to a chair (including a wheelchair)?: A Little Help needed standing up from a chair using your arms (e.g., wheelchair or bedside chair)?: A Little Help needed to walk in hospital room?: A Little Help needed climbing 3-5 steps with a railing? : Total 6 Click Score: 16    End of Session Equipment Utilized During Treatment: Gait belt Activity Tolerance: Patient tolerated treatment well Patient left: in chair;with call bell/phone within reach Nurse Communication: Mobility status PT Visit Diagnosis: Other abnormalities of gait and mobility (R26.89);Muscle weakness (generalized) (M62.81)     Time: 4854-6270 PT Time Calculation (min) (ACUTE ONLY): 20 min  Charges:  $Therapeutic Exercise: 8-22 mins                     Jakyla Reza R. PTA Acute Rehabilitation Services Office: [place] 10/06/2021, 11:54 AM

## 2021-10-06 NOTE — Plan of Care (Signed)

## 2021-10-06 NOTE — TOC Progression Note (Signed)
Transition of Care [facility]) - Progression Note    Patient Details  Name: Kevin Dalton MRN: [id number] Date of Birth: October 12, 1955  Transition of Care [facility]) CM/SW Contact  Joanne Chars, LCSW Phone Number: 10/06/2021, 1:29 PM  Clinical Narrative:   SNF auth request submitted in [place].     Expected Discharge Plan: [place] Barriers to Discharge: Continued Medical Work up, SNF Pending bed offer  Expected Discharge Plan and Services Expected Discharge Plan: [place] In-house Referral: Clinical Social Work   Post Acute Care Choice: [place] Living arrangements for the past 2 months: Single Family Home                                       Social Determinants of Health (SDOH) Interventions    Readmission Risk Interventions No flowsheet data found.

## 2021-10-06 NOTE — Progress Notes (Addendum)
Subjective: Doing well.  Pain controlled.  No bowel movement yet.  Denies nausea, vomiting, abd pain.  Eating without issues.   Objective: Vital signs in last 24 hours: Temp:  [97.6 F (36.4 C)-98.6 F (37 C)] 98.6 F (37 C) (02/28 0751) Pulse Rate:  [79-84] 81 (02/28 0751) Resp:  [16-17] 16 (02/28 0751) BP: (95-114)/(60-78) 105/69 (02/28 0751) SpO2:  [95 %-98 %] 95 % (02/28 0751)  Intake/Output from previous day: 02/27 0701 - 02/28 0700 In: -  Out: 1000 [Urine:1000] Intake/Output this shift: Total I/O In: -  Out: 300 [Urine:300]  Recent Labs    10/04/21 0101  HGB 11.1*   Recent Labs    10/04/21 0101  WBC 13.3*  RBC 3.34*  HCT 31.9*  PLT 209   No results for input(s): NA, K, CL, CO2, BUN, CREATININE, GLUCOSE, CALCIUM in the last 72 hours. No results for input(s): LABPT, INR in the last 72 hours.  Exam: Pleasant male, alert and oriented, NAD. Dressing c/d/I.  Changed this morning by RN.  Abd nontender, nondistended.      Assessment/Plan: Continue present care.  Awaiting insurance authorization for transfer to SNF.   Constipation - Try another dulcolax supp.    Benjiman Core 10/06/2021, 2:35 PM

## 2021-10-07 DIAGNOSIS — R2681 Unsteadiness on feet: Secondary | ICD-10-CM | POA: Diagnosis not present

## 2021-10-07 DIAGNOSIS — Z471 Aftercare following joint replacement surgery: Secondary | ICD-10-CM | POA: Diagnosis not present

## 2021-10-07 DIAGNOSIS — Z86010 Personal history of colonic polyps: Secondary | ICD-10-CM | POA: Diagnosis not present

## 2021-10-07 DIAGNOSIS — Q8500 Neurofibromatosis, unspecified: Secondary | ICD-10-CM | POA: Diagnosis not present

## 2021-10-07 DIAGNOSIS — R262 Difficulty in walking, not elsewhere classified: Secondary | ICD-10-CM | POA: Diagnosis not present

## 2021-10-07 DIAGNOSIS — Z741 Need for assistance with personal care: Secondary | ICD-10-CM | POA: Diagnosis not present

## 2021-10-07 DIAGNOSIS — R531 Weakness: Secondary | ICD-10-CM | POA: Diagnosis not present

## 2021-10-07 DIAGNOSIS — M6281 Muscle weakness (generalized): Secondary | ICD-10-CM | POA: Diagnosis not present

## 2021-10-07 DIAGNOSIS — Z862 Personal history of diseases of the blood and blood-forming organs and certain disorders involving the immune mechanism: Secondary | ICD-10-CM | POA: Diagnosis not present

## 2021-10-07 DIAGNOSIS — Z743 Need for continuous supervision: Secondary | ICD-10-CM | POA: Diagnosis not present

## 2021-10-07 DIAGNOSIS — D649 Anemia, unspecified: Secondary | ICD-10-CM | POA: Diagnosis not present

## 2021-10-07 DIAGNOSIS — I10 Essential (primary) hypertension: Secondary | ICD-10-CM | POA: Diagnosis not present

## 2021-10-07 DIAGNOSIS — M1611 Unilateral primary osteoarthritis, right hip: Secondary | ICD-10-CM | POA: Diagnosis not present

## 2021-10-07 NOTE — TOC Progression Note (Addendum)
Transition of Care (TOC) - Progression Note  ? ? ?Patient Details  ?Name: Kevin Dalton ?MRN: [id number] ?Date of Birth: Mar 07, 1956 ? ?Transition of Care (TOC) CM/SW Contact  ?Joanne Chars, LCSW ?Phone Number: ?10/07/2021, 8:24 AM ? ?Clinical Narrative:   SNF auth approved in [employer]: plan auth ID: [id number], HQI#[id number], 3 days: 3/1-3/3 ? ?0930: [facility].  She just received a call from [facility] that they were incorrect regarding managing this policy for SNF auth, they do not manage this policy and the auth in [place] is being voided out.  Marianna Fuss will need to submit for auth directly to [facility].  MD informed.   ? ?1115: [facility].  Josem Kaufmann has been approved already.  They can accept pt today.  MD informed.   ? ?8413: CSW spoke with Marianna Fuss, they need DC summary by 330pm.  CSW reached back out to MD/PA regarding time frame for DC summary ? ?1510: CSW reached out to MD office, was told both MD/PA in procedure ? ?Expected Discharge Plan: [place] ?Barriers to Discharge: Continued Medical Work up, SNF Pending bed offer ? ?Expected Discharge Plan and Services ?Expected Discharge Plan: [place] ?In-house Referral: Clinical Social Work ?  ?Post Acute Care Choice: [place] ?Living arrangements for the past 2 months: [place] ?                ?  ?  ?  ?  ?  ?  ?  ?  ?  ?  ? ? ?Social Determinants of Health (SDOH) Interventions ?  ? ?Readmission Risk Interventions ?No flowsheet data found. ? ?

## 2021-10-07 NOTE — TOC Transition Note (Signed)
Transition of Care (TOC) - CM/SW Discharge Note ? ? ?Patient Details  ?Name: Kevin Dalton ?MRN: [id number] ?Date of Birth: [birth year] ? ?Transition of Care [facility]) CM/SW Contact:  ?Joanne Chars, LCSW ?Phone Number: ?10/07/2021, 4:27 PM ? ? ?Clinical Narrative:   Pt discharging to [facility].  RN call report to [phone]. ? ? ? ?Final next level of care: [place] ?Barriers to Discharge: Barriers Resolved ? ? ?Patient Goals and CMS Choice ?Patient states their goals for this hospitalization and ongoing recovery are:: "walk normal" ?CMS Medicare.gov Compare Post Acute Care list provided to:: Patient ?Choice offered to / list presented to : Patient ? ?Discharge Placement ?  ?           ?Patient chooses bed at:  [facility]) ?Patient to be transferred to facility by: [facility]AR ?Name of family member notified: Thayer Headings, sister ?Patient and family notified of of transfer: 10/07/21 ? ?Discharge Plan and Services ?In-house Referral: Clinical Social Work ?  ?Post Acute Care Choice: [place]          ?  ?  ?  ?  ?  ?  ?  ?  ?  ?  ? ?Social Determinants of Health (SDOH) Interventions ?  ? ? ?Readmission Risk Interventions ?No flowsheet data found. ? ? ? ? ?

## 2021-10-07 NOTE — Progress Notes (Signed)
Subjective: ?Doing well.  No complaints.  Ready for transfer to snf  ? ?Objective: ?Vital signs in last 24 hours: ?Temp:  [97.7 ?F (36.5 ?C)-98 ?F (36.7 ?C)] 98 ?F (36.7 ?C) (03/01 0726) ?Pulse Rate:  [88-91] 91 (03/01 0726) ?Resp:  [16-18] 16 (03/01 0726) ?BP: (112-113)/(79-83) 113/83 (03/01 0726) ?SpO2:  [99 %-100 %] 100 % (03/01 0726) ? ?Intake/Output from previous day: ?02/28 0701 - 03/01 0700 ?In: 480 [P.O.:480] ?Out: 650 [Urine:650] ?Intake/Output this shift: ?Total I/O ?In: 480 [P.O.:480] ?Out: -  ? ?No results for input(s): HGB in the last 72 hours. ?No results for input(s): WBC, RBC, HCT, PLT in the last 72 hours. ?No results for input(s): NA, K, CL, CO2, BUN, CREATININE, GLUCOSE, CALCIUM in the last 72 hours. ?No results for input(s): LABPT, INR in the last 72 hours. ? ?Exam: ?Pleasant male, alert and oriented, NAD.  Wound looks good, staples intact.  No drainage or signs of infection.  ? ? ? ?Assessment/Plan: ?D/c  to snf.  ? ? ? ? ?Kevin Dalton ?10/07/2021, 4:18 PM  ? ? ? ? ?

## 2021-10-07 NOTE — Progress Notes (Signed)
Patient stable and ready for discharge. Dressing changed. All patient questions answered. Handoff report give to [facility]. ?

## 2021-10-07 NOTE — Plan of Care (Signed)
?  Problem: Education: Goal: Knowledge of General Education information will improve Description: Including pain rating scale, medication(s)/side effects and non-pharmacologic comfort measures Outcome: Progressing   Problem: Health Behavior/Discharge Planning: Goal: Ability to manage health-related needs will improve Outcome: Progressing   Problem: Clinical Measurements: Goal: Ability to maintain clinical measurements within normal limits will improve Outcome: Progressing   Problem: Activity: Goal: Risk for activity intolerance will decrease Outcome: Progressing   Problem: Nutrition: Goal: Adequate nutrition will be maintained Outcome: Progressing   Problem: Coping: Goal: Level of anxiety will decrease Outcome: Progressing   Problem: Elimination: Goal: Will not experience complications related to bowel motility Outcome: Progressing   Problem: Pain Managment: Goal: General experience of comfort will improve Outcome: Progressing   

## 2021-10-07 NOTE — Plan of Care (Signed)

## 2021-10-07 NOTE — Progress Notes (Signed)
Mobility Specialist Progress Note  ? ? 10/07/21 1553  ?Mobility  ?Bed Position Chair  ?Activity Ambulated with assistance in hallway  ?Level of Assistance Contact guard assist, steadying assist  ?Assistive Device Front wheel walker  ?RLE Weight Bearing WBAT  ?Distance Ambulated (ft) 320 ft  ?Activity Response Tolerated well  ?$Mobility charge 1 Mobility  ? ?Pt received sitting EOB and agreeable. No complaints. Returned to chair with call bell in reach. ? ?Hildred Alamin ?Mobility Specialist  ?M.S. 5N: [phone]  ?

## 2021-10-07 NOTE — Progress Notes (Signed)
Physical Therapy Treatment ?Patient Details ?Name: Kevin Dalton ?MRN: [id number] ?DOB: 01/12/1956 ?Today's Date: 10/07/2021 ? ? ?History of Present Illness Pt is 66 yo male admitted on 10/02/21 for R anterior THA.  Pt with hx including but not limited to HTN, OA, and neurofibromatosis ? ?  ?PT Comments  ? ? Pt received in room at sink and agreeable to session focused on LE exercise, but declining hall ambulation stating he just had long walk with MT. Pt with good tolerance for standing LE exercise for increased strength and ROM. Education reviewed re; precautions, HEP compliance and continued mobility with pt verbalizing understanding. Anticipate safe pt discharge to venue noted once medically cleared, will follow acutely. Pt continues to benefit from skilled PT services to progress toward functional mobility goals.  ?  ?Recommendations for follow up therapy are one component of a multi-disciplinary discharge planning process, led by the attending physician.  Recommendations may be updated based on patient status, additional functional criteria and insurance authorization. ? ?Follow Up Recommendations ? Follow physician's recommendations for discharge plan and follow up therapies (Per MD note - pt lives alone with plan for SNF) ?  ?  ?Assistance Recommended at Discharge Frequent or constant Supervision/Assistance  ?Patient can return home with the following A little help with walking and/or transfers;A little help with bathing/dressing/bathroom;Assistance with cooking/housework;Help with stairs or ramp for entrance ?  ?Equipment Recommendations ? Rolling walker (2 wheels)  ?  ?Recommendations for Other Services   ? ? ?  ?Precautions / Restrictions Precautions ?Precautions: Fall ?Restrictions ?Weight Bearing Restrictions: Yes ?RLE Weight Bearing: Weight bearing as tolerated  ?  ? ?Mobility ? Bed Mobility ?  ?  ?  ?  ?  ?  ?  ?General bed mobility comments: OOB upon arrival ?  ? ?Transfers ?Overall transfer level:  Modified independent ?Equipment used: Rolling walker (2 wheels) ?Transfers: Sit to/from Stand ?Sit to Stand: Modified independent (Device/Increase time) ?  ?  ?  ?  ?  ?  ?  ? ?Ambulation/Gait ?Ambulation/Gait assistance: Supervision ?Gait Distance (Feet): 10 Feet ?Assistive device: Rolling walker (2 wheels) ?Gait Pattern/deviations: Trunk flexed, Step-through pattern, Decreased step length - right, Decreased step length - left ?Gait velocity: decreased ?  ?  ?General Gait Details: Noted bil genu valgus (worse on L non-operative side) and difficulty with terminal knee ext bil in standing.  Ambulates with flexed trunk.  Cues for increased heel strike, closer walker proximity, and forward gaze. ? ? ?Stairs ?  ?  ?  ?  ?  ? ? ?Wheelchair Mobility ?  ? ?Modified Rankin (Stroke Patients Only) ?  ? ? ?  ?Balance Overall balance assessment: Needs assistance ?Sitting-balance support: No upper extremity supported ?Sitting balance-Leahy Scale: Good ?  ?  ?Standing balance support: Bilateral upper extremity supported, Single extremity supported ?Standing balance-Leahy Scale: Poor ?  ?  ?  ?  ?  ?  ?  ?  ?  ?  ?  ?  ?  ? ?  ?Cognition Arousal/Alertness: Awake/alert ?Behavior During Therapy: [facility] for tasks assessed/performed ?Overall Cognitive Status: Within Functional Limits for tasks assessed ?  ?  ?  ?  ?  ?  ?  ?  ?  ?  ?  ?  ?  ?  ?  ?  ?  ?  ?  ? ?  ?Exercises Total Joint Exercises ?Hip ABduction/ADduction: AROM, 10 reps, Standing, Right, Left ?Knee Flexion: AROM, 10 reps, Standing, Right, Left ?Marching in Standing:  AROM, Standing, Right, Left, 20 reps ?Standing Hip Extension: AROM, 10 reps, Standing, Left, Right ?General Exercises - Lower Extremity ?Heel Raises: AROM, Both, 10 reps, Standing ?Mini-Sqauts: AROM, Both, 10 reps, Standing ? ?  ?General Comments   ?  ?  ? ?Pertinent Vitals/Pain Pain Assessment ?Pain Assessment: No/denies pain  ? ? ?Home Living   ?  ?  ?  ?  ?  ?  ?  ?  ?  ?   ?  ?Prior Function    ?  ?  ?    ? ?PT Goals (current goals can now be found in the care plan section) Acute Rehab PT Goals ?Patient Stated Goal: improve walking ?PT Goal Formulation: With patient ?Time For Goal Achievement: 10/16/21 ?Potential to Achieve Goals: Good ? ?  ?Frequency ? ? ? 7X/week ? ? ? ?  ?PT Plan Current plan remains appropriate  ? ? ?Co-evaluation   ?  ?  ?  ?  ? ?  ?AM-PAC PT "6 Clicks" Mobility   ?Outcome Measure ? Help needed turning from your back to your side while in a flat bed without using bedrails?: None ?Help needed moving from lying on your back to sitting on the side of a flat bed without using bedrails?: None ?Help needed moving to and from a bed to a chair (including a wheelchair)?: None ?Help needed standing up from a chair using your arms (e.g., wheelchair or bedside chair)?: None ?Help needed to walk in hospital room?: A Little ?Help needed climbing 3-5 steps with a railing? : Total ?6 Click Score: 20 ? ?  ?End of Session Equipment Utilized During Treatment: Gait belt ?Activity Tolerance: Patient tolerated treatment well ?Patient left: in chair;with call bell/phone within reach;Other (comment) (with PA present) ?Nurse Communication: Mobility status ?PT Visit Diagnosis: Other abnormalities of gait and mobility (R26.89);Muscle weakness (generalized) (M62.81) ?  ? ? ?Time: 1610-9604 ?PT Time Calculation (min) (ACUTE ONLY): 15 min ? ?Charges:  $Therapeutic Exercise: 8-22 mins          ?          ? ?Audry Riles. PTA ?Acute Rehabilitation Services ?Office: [phone] ? ? ? ?Betsey Holiday Nakiesha Rumsey ?10/07/2021, 4:25 PM ? ?

## 2021-10-08 ENCOUNTER — Non-Acute Institutional Stay (SKILLED_NURSING_FACILITY): Payer: Medicare HMO | Admitting: Adult Health

## 2021-10-08 ENCOUNTER — Encounter: Payer: Self-pay | Admitting: Adult Health

## 2021-10-08 ENCOUNTER — Encounter: Payer: Medicare HMO | Admitting: Orthopaedic Surgery

## 2021-10-08 DIAGNOSIS — M1611 Unilateral primary osteoarthritis, right hip: Secondary | ICD-10-CM | POA: Diagnosis not present

## 2021-10-08 DIAGNOSIS — Q8500 Neurofibromatosis, unspecified: Secondary | ICD-10-CM

## 2021-10-08 DIAGNOSIS — I10 Essential (primary) hypertension: Secondary | ICD-10-CM

## 2021-10-08 NOTE — Progress Notes (Signed)
?Location:  [place] ?Nursing Home Room Number: 159-P ?Place of Service:  SNF (31) ? ? ?CODE STATUS:  ? ?No Known Allergies ? ?Chief Complaint  ?Patient presents with  ? Hospitalization Follow-up  ? ? ?HPI: ? ?He is a 66 year old man who has been hospitalized from 10-02-21 through 10-07-21. He has had a right hip replacement. He is here for short term rehab with his goal to return back home. He denies any uncontrolled pain in his right hip. He denies any constipation; no insomnia. He will continue to be followed for his chronic illnesses including:   Unilateral primary  osteoarthritis right hip:  Hypertension:Neurofibromatosis ? ?Past Medical History:  ?Diagnosis Date  ? Hypertension   ? Neurofibromatosis   ? ? ?Past Surgical History:  ?Procedure Laterality Date  ? COLONOSCOPY WITH PROPOFOL N/A 01/23/2021  ? Procedure: COLONOSCOPY WITH PROPOFOL;  Surgeon: Eloise Harman, DO;  [facility];  Service: Endoscopy;  Laterality: N/A;  ASA I/II / 12:00  ? COLONOSCOPY WITH PROPOFOL N/A 09/22/2021  ? Procedure: COLONOSCOPY WITH PROPOFOL;  Surgeon: Eloise Harman, DO;  Location: [facility];  Service: Endoscopy;  Laterality: N/A;  10:30 / ASA 2  ? NO PAST SURGERIES    ? POLYPECTOMY  01/23/2021  ? Procedure: POLYPECTOMY;  Surgeon: Eloise Harman, DO;  [facility]: [facility];  Service: Endoscopy;;  ? POLYPECTOMY  09/22/2021  ? Procedure: POLYPECTOMY;  Surgeon: Eloise Harman, DO;  [facility]: [facility];  Service: Endoscopy;;  ? TOTAL HIP ARTHROPLASTY Right 10/02/2021  ? Procedure: RIGHT TOTAL HIP ARTHROPLASTY ANTERIOR APPROACH;  Surgeon: Marybelle Killings, MD;  Location: [place];  Service: Orthopedics;  Laterality: Right;  ? ? ?Social History  ? ?Socioeconomic History  ? Marital status: Single  ?  Spouse name: Not on file  ? Number of children: Not on file  ? Years of education: Not on file  ? Highest education level: Not on file  ?Occupational History  ? Not on file  ?Tobacco Use  ? Smoking status:  Some Days  ?  Packs/day: 0.25  ?  Types: Cigarettes  ? Smokeless tobacco: Never  ?Vaping Use  ? Vaping Use: Never used  ?Substance and Sexual Activity  ? Alcohol use: Yes  ?  Comment: rarely  ? Drug use: No  ? Sexual activity: Not on file  ?Other Topics Concern  ? Not on file  ?Social History Narrative  ? Not on file  ? ?Social Determinants of Health  ? ?Financial Resource Strain: Not on file  ?Food Insecurity: Not on file  ?Transportation Needs: Not on file  ?Physical Activity: Not on file  ?Stress: Not on file  ?Social Connections: Not on file  ?Intimate Partner Violence: Not on file  ? ?Family History  ?Problem Relation Age of Onset  ? Colon cancer Brother   ? ? ? ? ?VITAL SIGNS ?BP 114/64   Temp (!) 97.3 ?F (36.3 ?C)   Resp 20   Ht 6' 0.5" (1.842 m)   Wt 140 lb (63.5 kg)   SpO2 99%   BMI 18.73 kg/m?  ? ?Outpatient Encounter Medications as of 10/08/2021  ?Medication Sig  ? amLODipine (NORVASC) 5 MG tablet Take 5 mg by mouth daily.  ? aspirin EC 325 MG EC tablet Take 1 tablet (325 mg total) by mouth daily with breakfast.  ? lisinopril (ZESTRIL) 10 MG tablet Take 10 mg by mouth daily.  ? methocarbamol (ROBAXIN) 500 MG tablet Take  1 tablet (500 mg total) by mouth every 6 (six) hours as needed for muscle spasms.  ? NON FORMULARY Diet:Regular  ? oxyCODONE-acetaminophen (PERCOCET/ROXICET) 5-325 MG tablet Take 1 tablet by mouth every 4 (four) hours as needed for severe pain.  ? [DISCONTINUED] benazepril (LOTENSIN) 10 MG tablet Take 10 mg by mouth daily.  ? ?No facility-administered encounter medications on file as of 10/08/2021.  ? ? ? ?SIGNIFICANT DIAGNOSTIC EXAMS ? ?TODAY ? ?2-25-23L wbc 14,8; hgb 11.7; hct 33.3; mcv 95.4 plt 236; glucose 110; bun 9; creat 0.71; k+ 4.1; na++ 131; ca 8.5; GFR>60 ? ?Review of Systems  ?Constitutional:  Negative for malaise/fatigue.  ?Respiratory:  Negative for cough and shortness of breath.   ?Cardiovascular:  Negative for chest pain, palpitations and leg swelling.  ?Gastrointestinal:   Negative for abdominal pain, constipation and heartburn.  ?Musculoskeletal:  Positive for joint pain. Negative for back pain and myalgias.  ?     Controlled right hip pain   ?Skin: Negative.   ?Neurological:  Negative for dizziness.  ?Psychiatric/Behavioral:  The patient is not nervous/anxious.   ? ?Physical Exam ?Constitutional:   ?   General: He is not in acute distress. ?   Appearance: He is well-developed. He is not diaphoretic.  ?Neck:  ?   Thyroid: No thyromegaly.  ?Cardiovascular:  ?   Rate and Rhythm: Normal rate and regular rhythm.  ?   Heart sounds: Normal heart sounds.  ?Pulmonary:  ?   Effort: Pulmonary effort is normal. No respiratory distress.  ?   Breath sounds: Normal breath sounds.  ?Abdominal:  ?   General: Bowel sounds are normal. There is no distension.  ?   Palpations: Abdomen is soft.  ?   Tenderness: There is no abdominal tenderness.  ?Musculoskeletal:     ?   General: Normal range of motion.  ?   Cervical back: Neck supple.  ?Lymphadenopathy:  ?   Cervical: No cervical adenopathy.  ?Skin: ?   General: Skin is warm and dry.  ?   Comments: Numerous tumors over his body  ?Incision line without signs of infection present   ?Neurological:  ?   Mental Status: He is alert and oriented to person, place, and time.  ?Psychiatric:     ?   Mood and Affect: Mood normal.  ? ? ? ? ?ASSESSMENT/ PLAN: ? ?TODAY ? ?Unilateral primary  osteoarthritis right hip: is  presently stable will continue therapy as directed and will follow up with orthopedics as indicated. Will continue percocet 5/325 mg every 4 hours as needed has robaxin 500 mg every 6 hours as needed is taking asa 325 mg daily  ? ?2. Hypertension:is stable b/p 114/64 will continue asa 325 mg daily norvasc 5 mg daily lisinopril 10 mg daily  ? ?3. Neurofibromatosis will monitor  ? ?Will check cbc;  ? ? ?Ok Edwards NP ?[facility]  ? call [phone]  ? ?

## 2021-10-08 NOTE — Progress Notes (Signed)
Underweight.    ?

## 2021-10-12 ENCOUNTER — Telehealth: Payer: Self-pay | Admitting: Orthopaedic Surgery

## 2021-10-12 NOTE — Telephone Encounter (Signed)
Pt called requesting to speak with Dr. Lorin Mercy. Pt states he had surgery and have medical questions for the doctor. Please call pt at [phone]. ?

## 2021-10-12 NOTE — Telephone Encounter (Signed)
noted 

## 2021-10-12 NOTE — Telephone Encounter (Signed)
Please advise 

## 2021-10-13 NOTE — Addendum Note (Signed)
Addendum  created 10/13/21 0843 by Orlie Dakin, CRNA  ? Intraprocedure Staff edited  ?  ?

## 2021-10-14 ENCOUNTER — Encounter: Payer: Self-pay | Admitting: Internal Medicine

## 2021-10-14 ENCOUNTER — Non-Acute Institutional Stay (SKILLED_NURSING_FACILITY): Payer: Medicare HMO | Admitting: Internal Medicine

## 2021-10-14 DIAGNOSIS — D649 Anemia, unspecified: Secondary | ICD-10-CM | POA: Insufficient documentation

## 2021-10-14 DIAGNOSIS — I10 Essential (primary) hypertension: Secondary | ICD-10-CM

## 2021-10-14 DIAGNOSIS — Z862 Personal history of diseases of the blood and blood-forming organs and certain disorders involving the immune mechanism: Secondary | ICD-10-CM

## 2021-10-14 DIAGNOSIS — Z86010 Personal history of colonic polyps: Secondary | ICD-10-CM | POA: Diagnosis not present

## 2021-10-14 DIAGNOSIS — M1611 Unilateral primary osteoarthritis, right hip: Secondary | ICD-10-CM | POA: Diagnosis not present

## 2021-10-14 NOTE — Patient Instructions (Signed)
See assessment and plan under each diagnosis in the problem list and acutely for this visit 

## 2021-10-14 NOTE — Assessment & Plan Note (Addendum)
PT/OT at SNF as tolerated. ?Orthopedic follow-up 10/22/2021. ?

## 2021-10-14 NOTE — Progress Notes (Signed)
? ?NURSING HOME LOCATION:  [place] ?ROOM NUMBER:  159 P ? ?CODE STATUS:  Full Code ? ?PCP:  Clarene Reamer MD ? ?This is a comprehensive admission note to this SNFperformed on this date less than 30 days from date of admission. ?Included are preadmission medical/surgical history; reconciled medication list; family history; social history and comprehensive review of systems.  ?Corrections and additions to the records were documented. Comprehensive physical exam was also performed. Additionally a clinical summary was entered for each active diagnosis pertinent to this admission in the Problem List to enhance continuity of care. ? ?HPI: He was hospitalized 2/24 - 10/07/2021 for osteoarthritis of the right hip for which right total hip arthroplasty was performed by Dr. Rodell Perna 2/24.  Patient had failed conservative treatments and had severe unremitting pain affecting sleep, ADLs, and work/hobbies.  Postop the patient had sequential compression device applied and early ambulation to prevent DVT.  Pain was controlled with oral medications and he advanced to a regular diet.  PT/OT cleared him for discharge to SNF for rehab. ?Hospital labs revealed mild hyponatremia with a value of 131.  Creatinine was 0.71 with GFR greater than 60 indicating CKD stage II.  Preop labs had revealed albumin of 3.4.  Preop H/H was 13.7/40.5 with a white count of 6200.  Postop he did exhibit leukocytosis with a white count of 14,800.  Prior to discharge it was 13,300.  Postop normochromic, normocytic anemia was present with H/H of 11.1/31.9. ? ?Past medical and surgical history: Includes essential hypertension and neurofibromatosis.  Surgeries and procedures include colonoscopy with polypectomy. ? ?Social history: Rare alcohol ingestion; fourth of a pack a day smoker. ? ?Family history: Includes history of colon cancer.  Sister also had diabetes. ?  ?Review of systems: He states that originally his pain was in the right  knee but Dr Lorin Mercy identified this as referred pain from the hip osteoarthritis.  Despite the drop in H/H; he denies any bleeding dyscrasias.  Also despite the leukocytosis he has no infectious symptoms or signs. ? ?Constitutional: No fever, significant weight change, fatigue  ?Eyes: No redness, discharge, pain, vision change ?ENT/mouth: No nasal congestion, purulent discharge, earache, change in hearing, sore throat  ?Cardiovascular: No chest pain, palpitations, paroxysmal nocturnal dyspnea, claudication, edema  ?Respiratory: No cough, sputum production, hemoptysis, DOE, significant snoring, apnea Gastrointestinal: No heartburn, dysphagia, abdominal pain, nausea /vomiting, rectal bleeding, melena, change in bowels ?Genitourinary: No dysuria, hematuria, pyuria, incontinence, nocturia ?Dermatologic: No rash, pruritus, new change in appearance of skin ?Neurologic: No dizziness, headache, syncope, seizures, numbness, tingling ?Psychiatric: No significant anxiety, depression, insomnia, anorexia ?Endocrine: No change in hair/skin/nails, excessive thirst, excessive hunger, excessive urination  ?Hematologic/lymphatic: No significant bruising, lymphadenopathy, abnormal bleeding ?Allergy/immunology: No itchy/watery eyes, significant sneezing, urticaria, angioedema ? ?Physical exam:  ?Pertinent or positive findings: He appears thin and suboptimally nourished.  The most striking physical finding is profound neurofibromata over the head, body, and limbs.  Literally there appear to be 100s of the neurofibromas.  Eyebrows are absent.  Eyes are sunken.  There is a large gap between the upper anterior incisors.  The first heart sound is markedly accentuated at the lower left sternal border.  Abdomen is scaphoid and the aorta is palpable.  It is not enlarged and there is no bruit.  Pedal pulses are decreased.  Limb atrophy is present as is marked interosseous wasting.  He has extension contractures of isolated fingers.  There is  marked enlargement of the second  and third MCP joints of the right hand.  The left knee is deformed and exhibits some crepitus.  No effusion is present.  Exam of the right lower extremity was limited because of the recent surgery. ?After my exam; he was observed ambulating with PT in the hallway using a rolling walker.  Dramatic varus deformity of the knees was visualized. ? ?General appearance: no acute distress, increased work of breathing is present.   ?Lymphatic: No lymphadenopathy about the head, neck, axilla. ?Eyes: No conjunctival inflammation or lid edema is present. There is no scleral icterus. ?Ears:  External ear exam shows no significant lesions or deformities.   ?Nose:  External nasal examination shows no deformity or inflammation. Nasal mucosa are pink and moist without lesions, exudates ?Oral exam: There is no oropharyngeal erythema or exudate. ?Neck:  No thyromegaly, masses, tenderness noted.    ?Heart:  Normal rate and regular rhythm. S2 normal without gallop, murmur, click, rub.  ?Lungs: Chest clear to auscultation without wheezes, rhonchi, rales, rubs. ?Abdomen: Bowel sounds are normal.  Abdomen is soft and nontender with no organomegaly, hernias, masses. ?GU: Deferred  ?Extremities:  No cyanosis, clubbing, edema. ?Neurologic exam:  Balance, Rhomberg, finger to nose testing could not be completed due to clinical state ?Skin: Warm & dry w/o tenting. ?No significant rash. ? ?See clinical summary under each active problem in the Problem List with associated updated therapeutic plan ? ?

## 2021-10-14 NOTE — Assessment & Plan Note (Signed)
He denies any infectious symptoms or signs.  He is afebrile.  This may represent stress reaction from the surgery. ?

## 2021-10-14 NOTE — Assessment & Plan Note (Signed)
No bleeding dyscrasias reported by staff or patient.  Anemia is not significant enough to warrant iron supplementation. ?

## 2021-10-14 NOTE — Assessment & Plan Note (Addendum)
BP controlled; no change in antihypertensive medications.  Blood pressure is actually somewhat soft.  If this persists or progresses; present regimen will be adjusted. ? ?

## 2021-10-14 NOTE — Assessment & Plan Note (Signed)
Surgical path findings discussed with patient.  Repeat colonoscopic surveillance as per Dr. Abbey Chatters. ?

## 2021-10-19 ENCOUNTER — Encounter: Payer: Self-pay | Admitting: Adult Health

## 2021-10-19 ENCOUNTER — Other Ambulatory Visit: Payer: Self-pay | Admitting: Adult Health

## 2021-10-19 ENCOUNTER — Non-Acute Institutional Stay (SKILLED_NURSING_FACILITY): Payer: Medicare HMO | Admitting: Adult Health

## 2021-10-19 DIAGNOSIS — I10 Essential (primary) hypertension: Secondary | ICD-10-CM

## 2021-10-19 DIAGNOSIS — M1611 Unilateral primary osteoarthritis, right hip: Secondary | ICD-10-CM

## 2021-10-19 DIAGNOSIS — Q8500 Neurofibromatosis, unspecified: Secondary | ICD-10-CM | POA: Diagnosis not present

## 2021-10-19 MED ORDER — AMLODIPINE BESYLATE 5 MG PO TABS: 5.0000 mg | ORAL_TABLET | Freq: Every day | ORAL | 0 refills | Status: AC

## 2021-10-19 MED ORDER — LISINOPRIL 10 MG PO TABS: 10.0000 mg | ORAL_TABLET | Freq: Every day | ORAL | 0 refills | Status: DC

## 2021-10-19 MED ORDER — METHOCARBAMOL 500 MG PO TABS: 500.0000 mg | ORAL_TABLET | Freq: Four times a day (QID) | ORAL | 0 refills | Status: AC | PRN

## 2021-10-19 NOTE — Progress Notes (Signed)
? ?Location:  [facility] ?Nursing Home Room Number: 159 ?Place of Service:  SNF (31) ? ?Provider: Ok Edwards np  ? ?PCP: Neale Burly, MD ?Patient Care Team: ?Neale Burly, MD as PCP - General (Internal Medicine) ?Rourk, Cristopher Estimable, MD as Consulting Physician (Gastroenterology) ? ?Extended Emergency Contact Information ?Primary Emergency Contact: CONNOLLY,MARY ?Mobile Phone: [phone] ?Relation: Sister ?Secondary Emergency Contact: STAPLES,JANICE ?Mobile Phone: [phone] ?Relation: Sister ? ?Code Status: full  ?Goals of care:  Advanced Directive information ?Advanced Directives 10/02/2021  ?Does Patient Have a Medical Advance Directive? No  ?Would patient like information on creating a medical advance directive? No - Patient declined  ? ? ? ?No Known Allergies ? ?Chief Complaint  ?Patient presents with  ? Discharge Note  ? ? ?HPI:  ?66 y.o. male  being discharged to home with home health for pt/ot. He will need his prescriptions written and will need to follow up with his medical provider. He had been hospitalized for a right hip replacement. He was admitted to this facility for short term rehab. He has participated in pt/ot to improve upon his level of independence with his adls. He is now ready to continue therapy on a home health basis.  ? ? ? ?Past Medical History:  ?Diagnosis Date  ? Hypertension   ? Neurofibromatosis   ? ? ?Past Surgical History:  ?Procedure Laterality Date  ? COLONOSCOPY WITH PROPOFOL N/A 01/23/2021  ? Procedure: COLONOSCOPY WITH PROPOFOL;  Surgeon: Eloise Harman, DO;  [facility];  Service: Endoscopy;  Laterality: N/A;  ASA I/II / 12:00  ? COLONOSCOPY WITH PROPOFOL N/A 09/22/2021  ? Procedure: COLONOSCOPY WITH PROPOFOL;  Surgeon: Eloise Harman, DO;  [facility];  Service: Endoscopy;  Laterality: N/A;  10:30 / ASA 2  ? POLYPECTOMY  01/23/2021  ? Procedure: POLYPECTOMY;  Surgeon: Eloise Harman, DO;  [facility];  Service:  Endoscopy;;  ? POLYPECTOMY  09/22/2021  ? Procedure: POLYPECTOMY;  Surgeon: Eloise Harman, DO;  [facility];  Service: Endoscopy;;  ? TOTAL HIP ARTHROPLASTY Right 10/02/2021  ? Procedure: RIGHT TOTAL HIP ARTHROPLASTY ANTERIOR APPROACH;  Surgeon: Marybelle Killings, MD;  Location: [place];  Service: Orthopedics;  Laterality: Right;  ? ? ?  reports that he has been smoking cigarettes. He has been smoking an average of .25 packs per day. He has never used smokeless tobacco. He reports current alcohol use. He reports that he does not use drugs. ?Social History  ? ?Socioeconomic History  ? Marital status: Single  ?  Spouse name: Not on file  ? Number of children: Not on file  ? Years of education: Not on file  ? Highest education level: Not on file  ?Occupational History  ? Not on file  ?Tobacco Use  ? Smoking status: Some Days  ?  Packs/day: 0.25  ?  Types: Cigarettes  ? Smokeless tobacco: Never  ?Vaping Use  ? Vaping Use: Never used  ?Substance and Sexual Activity  ? Alcohol use: Yes  ?  Comment: rarely  ? Drug use: No  ? Sexual activity: Not on file  ?Other Topics Concern  ? Not on file  ?Social History Narrative  ? Not on file  ? ?Social Determinants of Health  ? ?Financial Resource Strain: Not on file  ?Food Insecurity: Not on file  ?Transportation Needs: Not on file  ?Physical Activity: Not on file  ?Stress: Not on file  ?Social Connections: Not on  file  ?Intimate Partner Violence: Not on file  ? ?Functional Status Survey: ?  ? ?No Known Allergies ? ?Pertinent  Health Maintenance Due  ?Topic Date Due  ? INFLUENZA VACCINE  Never done  ? COLONOSCOPY (Pts 45-65yr Insurance coverage will need to be confirmed)  09/23/2031  ? ? ?Medications: ?Outpatient Encounter Medications as of 10/19/2021  ?Medication Sig  ? amLODipine (NORVASC) 5 MG tablet Take 1 tablet (5 mg total) by mouth daily.  ? aspirin EC 325 MG EC tablet Take 1 tablet (325 mg total) by mouth daily with breakfast.  ? lisinopril (ZESTRIL) 10 MG tablet  Take 1 tablet (10 mg total) by mouth daily.  ? methocarbamol (ROBAXIN) 500 MG tablet Take 1 tablet (500 mg total) by mouth every 6 (six) hours as needed for muscle spasms.  ? NON FORMULARY Diet:Regular  ? ?No facility-administered encounter medications on file as of 10/19/2021.  ? ?Vitals:  ? 10/19/21 1458  ?BP: 129/71  ?Pulse: 67  ?Temp: 98.1 ?F (36.7 ?C)  ?Weight: 122 lb 3.2 oz (55.4 kg)  ?Height: 6' 0.5" (1.842 m)  ? ?Body mass index is 16.35 kg/m?. ? ? ?SIGNIFICANT DIAGNOSTIC EXAMS ? ?TODAY ? ?2-25-23L wbc 14,8; hgb 11.7; hct 33.3; mcv 95.4 plt 236; glucose 110; bun 9; creat 0.71; k+ 4.1; na++ 131; ca 8.5; GFR>60 ? ?Review of Systems  ?Constitutional:  Negative for malaise/fatigue.  ?Respiratory:  Negative for cough and shortness of breath.   ?Cardiovascular:  Negative for chest pain, palpitations and leg swelling.  ?Gastrointestinal:  Negative for abdominal pain, constipation and heartburn.  ?Musculoskeletal:  Negative for back pain, joint pain and myalgias.  ?Skin: Negative.   ?Neurological:  Negative for dizziness.  ?Psychiatric/Behavioral:  The patient is not nervous/anxious.   ? ?Physical Exam ?Constitutional:   ?   General: He is not in acute distress. ?   Appearance: He is underweight. He is not diaphoretic.  ?Neck:  ?   Thyroid: No thyromegaly.  ?Cardiovascular:  ?   Rate and Rhythm: Normal rate and regular rhythm.  ?   Pulses: Normal pulses.  ?   Heart sounds: Normal heart sounds.  ?Pulmonary:  ?   Effort: Pulmonary effort is normal. No respiratory distress.  ?   Breath sounds: Normal breath sounds.  ?Abdominal:  ?   General: Bowel sounds are normal. There is no distension.  ?   Palpations: Abdomen is soft.  ?   Tenderness: There is no abdominal tenderness.  ?Musculoskeletal:     ?   General: Normal range of motion.  ?   Cervical back: Neck supple.  ?   Right lower leg: No edema.  ?   Left lower leg: No edema.  ?Lymphadenopathy:  ?   Cervical: No cervical adenopathy.  ?Skin: ?   General: Skin is warm and  dry.  ?   Comments: Numerous tumors over his body  ?Incision line without signs of infection present    ?Neurological:  ?   Mental Status: He is alert and oriented to person, place, and time.  ?Psychiatric:     ?   Mood and Affect: Mood normal.  ? ? ? ?Assessment/Plan:   ? ?Patient is being discharged with the following home health services: pt/ot to evaluate and treat as indicated for gait balance strength adl training.   ? ?Patient is being discharged with the following durable medical equipment:  front wheel walker to allow him to maintain his current level of independence with his adls.  ? ?  Patient has been advised to f/u with their PCP in 1-2 weeks to for a transitions of care visit.  Social services at their facility was responsible for arranging this appointment.  Pt was provided with adequate prescriptions of noncontrolled medications to reach the scheduled appointment .  For controlled substances, a limited supply was provided as appropriate for the individual patient.  If the pt normally receives these medications from a pain clinic or has a contract with another physician, these medications should be received from that clinic or physician only).   ? ?A 30 day supply of her prescription medications have been sent to [facility]  ?  ?

## 2021-10-22 ENCOUNTER — Ambulatory Visit (INDEPENDENT_AMBULATORY_CARE_PROVIDER_SITE_OTHER): Payer: Medicare HMO

## 2021-10-22 ENCOUNTER — Other Ambulatory Visit: Payer: Self-pay

## 2021-10-22 ENCOUNTER — Ambulatory Visit (INDEPENDENT_AMBULATORY_CARE_PROVIDER_SITE_OTHER): Payer: Medicare HMO | Admitting: Orthopaedic Surgery

## 2021-10-22 DIAGNOSIS — Z96641 Presence of right artificial hip joint: Secondary | ICD-10-CM

## 2021-10-22 DIAGNOSIS — Z681 Body mass index (BMI) 19 or less, adult: Secondary | ICD-10-CM | POA: Diagnosis not present

## 2021-10-22 MED ORDER — OXYCODONE-ACETAMINOPHEN 5-325 MG PO TABS: 1.0000 | ORAL_TABLET | Freq: Two times a day (BID) | ORAL | 0 refills | Status: DC | PRN

## 2021-10-22 NOTE — Progress Notes (Signed)
? ?Post-Op Visit Note ?  ?Patient: Kevin Dalton           ?Date of Birth: 1956-05-31           ?MRN: [id number] ?Visit Date: 10/22/2021 ?PCP: Neale Burly, MD ? ? ?Assessment & Plan: Postop right total hip arthroplasty.  He is happy that his distal thigh pain and pain in his knee is gone.  Staples are removed today Steri-Strips applied.  Return for final visit in 5 weeks ? ?Chief Complaint:  ?Chief Complaint  ?Patient presents with  ? Right Hip - Routine Post Op  ? ?Visit Diagnoses:  ?1. S/P total right hip arthroplasty   ? ? ?Plan: Return 5 weeks no x-ray needed on return. ?Follow-Up Instructions: Return in about 5 weeks (around 11/26/2021).  ? ?Orders:  ?Orders Placed This Encounter  ?Procedures  ? XR HIP UNILAT W OR W/O PELVIS 2-3 VIEWS RIGHT  ? ?Meds ordered this encounter  ?Medications  ? oxyCODONE-acetaminophen (PERCOCET/ROXICET) 5-325 MG tablet  ?  Sig: Take 1 tablet by mouth every 12 (twelve) hours as needed for severe pain.  ?  Dispense:  15 tablet  ?  Refill:  0  ?  Post op pain  ? ? ?Imaging: ?XR HIP UNILAT W OR W/O PELVIS 2-3 VIEWS RIGHT ? ?Result Date: 10/22/2021 ?Standing AP pelvis frog-leg lateral obtained and reviewed this shows equal leg lengths.  Good position without loosening or subsidence.  1 mm gap between the collar and the calcar unchanged from Intra-Op images. Impression: Satisfactory right total hip arthroplasty.  ? ?PMFS History: ?Patient Active Problem List  ? Diagnosis Date Noted  ? Hx of adenomatous colonic polyps 10/14/2021  ? Postoperative anemia 10/14/2021  ? H/O leukocytosis 10/14/2021  ? Essential hypertension 10/08/2021  ? Arthritis of right hip 10/02/2021  ? Neurofibromatosis ([place]) 08/27/2021  ? Unilateral primary osteoarthritis, right hip 08/27/2021  ? Other closed fractures of upper end of humerus 06/01/2011  ? Pain in joint, shoulder region 06/01/2011  ? Muscle weakness (generalized) 06/01/2011  ? ?Past Medical History:  ?Diagnosis Date  ? Hypertension   ? Neurofibromatosis    ?  ?Family History  ?Problem Relation Age of Onset  ? Diabetes Sister   ? Colon cancer Brother   ?  ?Past Surgical History:  ?Procedure Laterality Date  ? COLONOSCOPY WITH PROPOFOL N/A 01/23/2021  ? Procedure: COLONOSCOPY WITH PROPOFOL;  Surgeon: Eloise Harman, DO;  [facility];  Service: Endoscopy;  Laterality: N/A;  ASA I/II / 12:00  ? COLONOSCOPY WITH PROPOFOL N/A 09/22/2021  ? Procedure: COLONOSCOPY WITH PROPOFOL;  Surgeon: Eloise Harman, DO;  [facility];  Service: Endoscopy;  Laterality: N/A;  10:30 / ASA 2  ? POLYPECTOMY  01/23/2021  ? Procedure: POLYPECTOMY;  Surgeon: Eloise Harman, DO;  [facility];  Service: Endoscopy;;  ? POLYPECTOMY  09/22/2021  ? Procedure: POLYPECTOMY;  Surgeon: Eloise Harman, DO;  [facility];  Service: Endoscopy;;  ? TOTAL HIP ARTHROPLASTY Right 10/02/2021  ? Procedure: RIGHT TOTAL HIP ARTHROPLASTY ANTERIOR APPROACH;  Surgeon: Marybelle Killings, MD;  Location: [place];  Service: Orthopedics;  Laterality: Right;  ? ?Social History  ? ?Occupational History  ? Not on file  ?Tobacco Use  ? Smoking status: Some Days  ?  Packs/day: 0.25  ?  Types: Cigarettes  ? Smokeless tobacco: Never  ?Vaping Use  ? Vaping Use: Never used  ?Substance and Sexual Activity  ? Alcohol  use: Yes  ?  Comment: rarely  ? Drug use: No  ? Sexual activity: Not on file  ? ? ? ?

## 2021-11-10 DIAGNOSIS — Z681 Body mass index (BMI) 19 or less, adult: Secondary | ICD-10-CM | POA: Diagnosis not present

## 2021-11-10 DIAGNOSIS — I10 Essential (primary) hypertension: Secondary | ICD-10-CM | POA: Diagnosis not present

## 2021-11-10 DIAGNOSIS — J449 Chronic obstructive pulmonary disease, unspecified: Secondary | ICD-10-CM | POA: Diagnosis not present

## 2021-11-10 DIAGNOSIS — M5418 Radiculopathy, sacral and sacrococcygeal region: Secondary | ICD-10-CM | POA: Diagnosis not present

## 2021-11-10 DIAGNOSIS — Z96641 Presence of right artificial hip joint: Secondary | ICD-10-CM | POA: Diagnosis not present

## 2021-11-19 DIAGNOSIS — I739 Peripheral vascular disease, unspecified: Secondary | ICD-10-CM | POA: Diagnosis not present

## 2021-11-26 DIAGNOSIS — Z96641 Presence of right artificial hip joint: Secondary | ICD-10-CM | POA: Diagnosis not present

## 2021-11-26 DIAGNOSIS — M5418 Radiculopathy, sacral and sacrococcygeal region: Secondary | ICD-10-CM | POA: Diagnosis not present

## 2021-11-26 DIAGNOSIS — J449 Chronic obstructive pulmonary disease, unspecified: Secondary | ICD-10-CM | POA: Diagnosis not present

## 2021-11-26 DIAGNOSIS — Z681 Body mass index (BMI) 19 or less, adult: Secondary | ICD-10-CM | POA: Diagnosis not present

## 2021-11-26 DIAGNOSIS — Z0000 Encounter for general adult medical examination without abnormal findings: Secondary | ICD-10-CM | POA: Diagnosis not present

## 2021-11-26 DIAGNOSIS — I10 Essential (primary) hypertension: Secondary | ICD-10-CM | POA: Diagnosis not present

## 2021-11-26 DIAGNOSIS — Q8501 Neurofibromatosis, type 1: Secondary | ICD-10-CM | POA: Diagnosis not present

## 2021-12-03 ENCOUNTER — Ambulatory Visit (INDEPENDENT_AMBULATORY_CARE_PROVIDER_SITE_OTHER): Payer: Medicare HMO | Admitting: Orthopaedic Surgery

## 2021-12-03 DIAGNOSIS — Z96641 Presence of right artificial hip joint: Secondary | ICD-10-CM

## 2021-12-03 DIAGNOSIS — Z96649 Presence of unspecified artificial hip joint: Secondary | ICD-10-CM | POA: Insufficient documentation

## 2021-12-03 NOTE — Progress Notes (Signed)
? ?  Post-Op Visit Note ?  ?Patient: Kevin Dalton           ?Date of Birth: [birth year]           ?MRN: [id number] ?Visit Date: 12/03/2021 ?PCP: Neale Burly, MD ? ? ?Assessment & Plan: Post right total hip arthroplasty.  Patient still has some soreness in his thighs walking better every month states after the end of the day feels stiff in his hip.  Incisions well-healed he is happy the surgery result.  Continue increased walking he can wean his way off the cane.  Follow-up as needed. ? ?Chief Complaint: No chief complaint on file. ? ?Visit Diagnoses:  ?1. History of total right hip replacement   ? ? ?Plan: Increase ambulation return as needed he is to have the surgery resolved. ? ?Follow-Up Instructions: No follow-ups on file.  ? ?Orders:  ?No orders of the defined types were placed in this encounter. ? ?No orders of the defined types were placed in this encounter. ? ? ?Imaging: ?No results found. ? ?PMFS History: ?Patient Active Problem List  ? Diagnosis Date Noted  ? Hx of total hip arthroplasty 12/03/2021  ? Hx of adenomatous colonic polyps 10/14/2021  ? Postoperative anemia 10/14/2021  ? H/O leukocytosis 10/14/2021  ? Essential hypertension 10/08/2021  ? Arthritis of right hip 10/02/2021  ? Neurofibromatosis ([place]) 08/27/2021  ? Other closed fractures of upper end of humerus 06/01/2011  ? Pain in joint, shoulder region 06/01/2011  ? Muscle weakness (generalized) 06/01/2011  ? ?Past Medical History:  ?Diagnosis Date  ? Hypertension   ? Neurofibromatosis   ?  ?Family History  ?Problem Relation Age of Onset  ? Diabetes Sister   ? Colon cancer Brother   ?  ?Past Surgical History:  ?Procedure Laterality Date  ? COLONOSCOPY WITH PROPOFOL N/A 01/23/2021  ? Procedure: COLONOSCOPY WITH PROPOFOL;  Surgeon: Eloise Harman, DO;  [facility];  Service: Endoscopy;  Laterality: N/A;  ASA I/II / 12:00  ? COLONOSCOPY WITH PROPOFOL N/A 09/22/2021  ? Procedure: COLONOSCOPY WITH PROPOFOL;  Surgeon: Eloise Harman,  DO;  [facility];  Service: Endoscopy;  Laterality: N/A;  10:30 / ASA 2  ? POLYPECTOMY  01/23/2021  ? Procedure: POLYPECTOMY;  Surgeon: Eloise Harman, DO;  [facility];  Service: Endoscopy;;  ? POLYPECTOMY  09/22/2021  ? Procedure: POLYPECTOMY;  Surgeon: Eloise Harman, DO;  [facility];  Service: Endoscopy;;  ? TOTAL HIP ARTHROPLASTY Right 10/02/2021  ? Procedure: RIGHT TOTAL HIP ARTHROPLASTY ANTERIOR APPROACH;  Surgeon: Marybelle Killings, MD;  Location: [place];  Service: Orthopedics;  Laterality: Right;  ? ?Social History  ? ?Occupational History  ? Not on file  ?Tobacco Use  ? Smoking status: Some Days  ?  Packs/day: 0.25  ?  Types: Cigarettes  ? Smokeless tobacco: Never  ?Vaping Use  ? Vaping Use: Never used  ?Substance and Sexual Activity  ? Alcohol use: Yes  ?  Comment: rarely  ? Drug use: No  ? Sexual activity: Not on file  ? ? ? ?

## 2022-02-16 DIAGNOSIS — J449 Chronic obstructive pulmonary disease, unspecified: Secondary | ICD-10-CM | POA: Diagnosis not present

## 2022-02-16 DIAGNOSIS — Z681 Body mass index (BMI) 19 or less, adult: Secondary | ICD-10-CM | POA: Diagnosis not present

## 2022-02-16 DIAGNOSIS — Q8501 Neurofibromatosis, type 1: Secondary | ICD-10-CM | POA: Diagnosis not present

## 2022-02-16 DIAGNOSIS — Z96641 Presence of right artificial hip joint: Secondary | ICD-10-CM | POA: Diagnosis not present

## 2022-02-16 DIAGNOSIS — I10 Essential (primary) hypertension: Secondary | ICD-10-CM | POA: Diagnosis not present

## 2022-02-16 DIAGNOSIS — M5418 Radiculopathy, sacral and sacrococcygeal region: Secondary | ICD-10-CM | POA: Diagnosis not present

## 2022-05-20 DIAGNOSIS — Z96641 Presence of right artificial hip joint: Secondary | ICD-10-CM | POA: Diagnosis not present

## 2022-05-20 DIAGNOSIS — M5418 Radiculopathy, sacral and sacrococcygeal region: Secondary | ICD-10-CM | POA: Diagnosis not present

## 2022-05-20 DIAGNOSIS — J449 Chronic obstructive pulmonary disease, unspecified: Secondary | ICD-10-CM | POA: Diagnosis not present

## 2022-05-20 DIAGNOSIS — Z681 Body mass index (BMI) 19 or less, adult: Secondary | ICD-10-CM | POA: Diagnosis not present

## 2022-05-20 DIAGNOSIS — I10 Essential (primary) hypertension: Secondary | ICD-10-CM | POA: Diagnosis not present

## 2022-05-20 DIAGNOSIS — Q8501 Neurofibromatosis, type 1: Secondary | ICD-10-CM | POA: Diagnosis not present

## 2022-07-15 ENCOUNTER — Other Ambulatory Visit: Payer: Self-pay | Admitting: Orthopaedic Surgery

## 2022-07-15 ENCOUNTER — Telehealth: Payer: Self-pay | Admitting: Radiology

## 2022-07-15 MED ORDER — TRAMADOL HCL 50 MG PO TABS
50.0000 mg | ORAL_TABLET | Freq: Two times a day (BID) | ORAL | 0 refills | Status: DC | PRN
Start: 2022-07-15 — End: 2023-02-25

## 2022-07-15 NOTE — Telephone Encounter (Signed)
I called patient and advised. 

## 2022-07-15 NOTE — Telephone Encounter (Signed)
Patient called [facility] office stating he had hip surgery in February 2023. He is now experiencing some back pain and would like to know if you would call in hydrocodone to the [facility] in [place].  CB for patient [phone]

## 2022-09-06 DIAGNOSIS — Z96641 Presence of right artificial hip joint: Secondary | ICD-10-CM | POA: Diagnosis not present

## 2022-09-06 DIAGNOSIS — J449 Chronic obstructive pulmonary disease, unspecified: Secondary | ICD-10-CM | POA: Diagnosis not present

## 2022-09-06 DIAGNOSIS — Z681 Body mass index (BMI) 19 or less, adult: Secondary | ICD-10-CM | POA: Diagnosis not present

## 2022-09-06 DIAGNOSIS — Z125 Encounter for screening for malignant neoplasm of prostate: Secondary | ICD-10-CM | POA: Diagnosis not present

## 2022-09-06 DIAGNOSIS — I10 Essential (primary) hypertension: Secondary | ICD-10-CM | POA: Diagnosis not present

## 2022-09-06 DIAGNOSIS — Q8501 Neurofibromatosis, type 1: Secondary | ICD-10-CM | POA: Diagnosis not present

## 2022-09-06 DIAGNOSIS — Z0000 Encounter for general adult medical examination without abnormal findings: Secondary | ICD-10-CM | POA: Diagnosis not present

## 2022-09-06 DIAGNOSIS — M5418 Radiculopathy, sacral and sacrococcygeal region: Secondary | ICD-10-CM | POA: Diagnosis not present

## 2022-12-06 DIAGNOSIS — I10 Essential (primary) hypertension: Secondary | ICD-10-CM | POA: Diagnosis not present

## 2022-12-06 DIAGNOSIS — Z0000 Encounter for general adult medical examination without abnormal findings: Secondary | ICD-10-CM | POA: Diagnosis not present

## 2022-12-06 DIAGNOSIS — J449 Chronic obstructive pulmonary disease, unspecified: Secondary | ICD-10-CM | POA: Diagnosis not present

## 2022-12-06 DIAGNOSIS — Z681 Body mass index (BMI) 19 or less, adult: Secondary | ICD-10-CM | POA: Diagnosis not present

## 2022-12-06 DIAGNOSIS — Q8501 Neurofibromatosis, type 1: Secondary | ICD-10-CM | POA: Diagnosis not present

## 2022-12-06 DIAGNOSIS — Z96641 Presence of right artificial hip joint: Secondary | ICD-10-CM | POA: Diagnosis not present

## 2022-12-16 DIAGNOSIS — I10 Essential (primary) hypertension: Secondary | ICD-10-CM | POA: Diagnosis not present

## 2022-12-16 DIAGNOSIS — Q8501 Neurofibromatosis, type 1: Secondary | ICD-10-CM | POA: Diagnosis not present

## 2022-12-16 DIAGNOSIS — Z681 Body mass index (BMI) 19 or less, adult: Secondary | ICD-10-CM | POA: Diagnosis not present

## 2022-12-22 DIAGNOSIS — Q8501 Neurofibromatosis, type 1: Secondary | ICD-10-CM | POA: Diagnosis not present

## 2022-12-22 DIAGNOSIS — Z681 Body mass index (BMI) 19 or less, adult: Secondary | ICD-10-CM | POA: Diagnosis not present

## 2022-12-22 DIAGNOSIS — I10 Essential (primary) hypertension: Secondary | ICD-10-CM | POA: Diagnosis not present

## 2023-02-09 DIAGNOSIS — M810 Age-related osteoporosis without current pathological fracture: Secondary | ICD-10-CM | POA: Diagnosis not present

## 2023-02-22 DIAGNOSIS — R1110 Vomiting, unspecified: Secondary | ICD-10-CM | POA: Diagnosis not present

## 2023-02-22 DIAGNOSIS — Z681 Body mass index (BMI) 19 or less, adult: Secondary | ICD-10-CM | POA: Diagnosis not present

## 2023-02-22 DIAGNOSIS — R030 Elevated blood-pressure reading, without diagnosis of hypertension: Secondary | ICD-10-CM | POA: Diagnosis not present

## 2023-02-23 ENCOUNTER — Inpatient Hospital Stay (HOSPITAL_COMMUNITY)
Admission: EM | Admit: 2023-02-23 | Discharge: 2023-02-25 | DRG: 378 | Disposition: A | Discharge status: Home / Self Care | Payer: Medicare HMO | Attending: Family Medicine | Admitting: Family Medicine

## 2023-02-23 ENCOUNTER — Encounter (HOSPITAL_COMMUNITY): Payer: Self-pay | Admitting: *Deleted

## 2023-02-23 ENCOUNTER — Other Ambulatory Visit: Payer: Self-pay

## 2023-02-23 DIAGNOSIS — B9681 Helicobacter pylori [H. pylori] as the cause of diseases classified elsewhere: Secondary | ICD-10-CM | POA: Diagnosis present

## 2023-02-23 DIAGNOSIS — R61 Generalized hyperhidrosis: Secondary | ICD-10-CM | POA: Diagnosis not present

## 2023-02-23 DIAGNOSIS — K921 Melena: Secondary | ICD-10-CM | POA: Diagnosis not present

## 2023-02-23 DIAGNOSIS — E861 Hypovolemia: Secondary | ICD-10-CM | POA: Diagnosis not present

## 2023-02-23 DIAGNOSIS — R9431 Abnormal electrocardiogram [ECG] [EKG]: Secondary | ICD-10-CM | POA: Diagnosis not present

## 2023-02-23 DIAGNOSIS — Z833 Family history of diabetes mellitus: Secondary | ICD-10-CM | POA: Diagnosis not present

## 2023-02-23 DIAGNOSIS — D696 Thrombocytopenia, unspecified: Secondary | ICD-10-CM | POA: Diagnosis not present

## 2023-02-23 DIAGNOSIS — D62 Acute posthemorrhagic anemia: Secondary | ICD-10-CM | POA: Diagnosis present

## 2023-02-23 DIAGNOSIS — E871 Hypo-osmolality and hyponatremia: Secondary | ICD-10-CM | POA: Diagnosis not present

## 2023-02-23 DIAGNOSIS — I10 Essential (primary) hypertension: Secondary | ICD-10-CM | POA: Diagnosis present

## 2023-02-23 DIAGNOSIS — Z86010 Personal history of colonic polyps: Secondary | ICD-10-CM

## 2023-02-23 DIAGNOSIS — Z96641 Presence of right artificial hip joint: Secondary | ICD-10-CM | POA: Diagnosis present

## 2023-02-23 DIAGNOSIS — I959 Hypotension, unspecified: Secondary | ICD-10-CM | POA: Diagnosis present

## 2023-02-23 DIAGNOSIS — R42 Dizziness and giddiness: Secondary | ICD-10-CM | POA: Diagnosis not present

## 2023-02-23 DIAGNOSIS — K254 Chronic or unspecified gastric ulcer with hemorrhage: Principal | ICD-10-CM | POA: Diagnosis present

## 2023-02-23 DIAGNOSIS — F17210 Nicotine dependence, cigarettes, uncomplicated: Secondary | ICD-10-CM | POA: Diagnosis present

## 2023-02-23 DIAGNOSIS — K625 Hemorrhage of anus and rectum: Secondary | ICD-10-CM | POA: Diagnosis not present

## 2023-02-23 DIAGNOSIS — K2960 Other gastritis without bleeding: Secondary | ICD-10-CM | POA: Diagnosis not present

## 2023-02-23 DIAGNOSIS — Q8500 Neurofibromatosis, unspecified: Secondary | ICD-10-CM | POA: Diagnosis not present

## 2023-02-23 DIAGNOSIS — F17200 Nicotine dependence, unspecified, uncomplicated: Secondary | ICD-10-CM | POA: Diagnosis not present

## 2023-02-23 DIAGNOSIS — Z800 Family history of malignant neoplasm of digestive organs: Secondary | ICD-10-CM | POA: Diagnosis not present

## 2023-02-23 DIAGNOSIS — Z7982 Long term (current) use of aspirin: Secondary | ICD-10-CM

## 2023-02-23 DIAGNOSIS — K922 Gastrointestinal hemorrhage, unspecified: Secondary | ICD-10-CM | POA: Diagnosis not present

## 2023-02-23 DIAGNOSIS — Z79899 Other long term (current) drug therapy: Secondary | ICD-10-CM | POA: Diagnosis not present

## 2023-02-23 DIAGNOSIS — K259 Gastric ulcer, unspecified as acute or chronic, without hemorrhage or perforation: Secondary | ICD-10-CM | POA: Diagnosis not present

## 2023-02-23 DIAGNOSIS — K253 Acute gastric ulcer without hemorrhage or perforation: Secondary | ICD-10-CM

## 2023-02-23 DIAGNOSIS — K3189 Other diseases of stomach and duodenum: Secondary | ICD-10-CM | POA: Diagnosis present

## 2023-02-23 DIAGNOSIS — K2971 Gastritis, unspecified, with bleeding: Secondary | ICD-10-CM | POA: Diagnosis present

## 2023-02-23 DIAGNOSIS — R110 Nausea: Secondary | ICD-10-CM | POA: Diagnosis not present

## 2023-02-23 DIAGNOSIS — K279 Peptic ulcer, site unspecified, unspecified as acute or chronic, without hemorrhage or perforation: Secondary | ICD-10-CM

## 2023-02-23 DIAGNOSIS — Z66 Do not resuscitate: Secondary | ICD-10-CM | POA: Diagnosis not present

## 2023-02-23 DIAGNOSIS — E860 Dehydration: Secondary | ICD-10-CM | POA: Diagnosis not present

## 2023-02-23 DIAGNOSIS — K2970 Gastritis, unspecified, without bleeding: Secondary | ICD-10-CM | POA: Diagnosis not present

## 2023-02-23 LAB — URINALYSIS, ROUTINE W REFLEX MICROSCOPIC
Bilirubin Urine: NEGATIVE
Glucose, UA: NEGATIVE mg/dL
Hgb urine dipstick: NEGATIVE
Ketones, ur: NEGATIVE mg/dL
Leukocytes,Ua: NEGATIVE
Nitrite: NEGATIVE
Protein, ur: NEGATIVE mg/dL
Specific Gravity, Urine: 1.016 (ref 1.005–1.030)
pH: 5 (ref 5.0–8.0)

## 2023-02-23 LAB — POC OCCULT BLOOD, ED: Fecal Occult Bld: POSITIVE — AB

## 2023-02-23 LAB — COMPREHENSIVE METABOLIC PANEL
ALT: 9 U/L (ref 0–44)
AST: 12 U/L — ABNORMAL LOW (ref 15–41)
Albumin: 2.6 g/dL — ABNORMAL LOW (ref 3.5–5.0)
Alkaline Phosphatase: 53 U/L (ref 38–126)
Anion gap: 4 — ABNORMAL LOW (ref 5–15)
BUN: 33 mg/dL — ABNORMAL HIGH (ref 8–23)
CO2: 21 mmol/L — ABNORMAL LOW (ref 22–32)
Calcium: 7.8 mg/dL — ABNORMAL LOW (ref 8.9–10.3)
Chloride: 107 mmol/L (ref 98–111)
Creatinine, Ser: 0.65 mg/dL (ref 0.61–1.24)
GFR, Estimated: >60 mL/min (ref >60)
Glucose, Bld: 114 mg/dL — ABNORMAL HIGH (ref 70–99)
Potassium: 4.3 mmol/L (ref 3.5–5.1)
Sodium: 132 mmol/L — ABNORMAL LOW (ref 135–145)
Total Bilirubin: 0.4 mg/dL (ref 0.3–1.2)
Total Protein: 4.9 g/dL — ABNORMAL LOW (ref 6.5–8.1)

## 2023-02-23 LAB — TROPONIN I (HIGH SENSITIVITY)
Troponin I (High Sensitivity): 3 ng/L (ref <18)
Troponin I (High Sensitivity): 3 ng/L (ref <18)

## 2023-02-23 LAB — CBC WITH DIFFERENTIAL/PLATELET
Abs Immature Granulocytes: 0.07 10*3/uL (ref 0.00–0.07)
Basophils Absolute: 0 10*3/uL (ref 0.0–0.1)
Basophils Relative: 0 %
Eosinophils Absolute: 0 10*3/uL (ref 0.0–0.5)
Eosinophils Relative: 0 %
HCT: 18.4 % — ABNORMAL LOW (ref 39.0–52.0)
Hemoglobin: 6 g/dL — CL (ref 13.0–17.0)
Immature Granulocytes: 1 %
Lymphocytes Relative: 13 %
Lymphs Abs: 1.3 10*3/uL (ref 0.7–4.0)
MCH: 32.8 pg (ref 26.0–34.0)
MCHC: 32.6 g/dL (ref 30.0–36.0)
MCV: 100.5 fL — ABNORMAL HIGH (ref 80.0–100.0)
Monocytes Absolute: 1.1 10*3/uL — ABNORMAL HIGH (ref 0.1–1.0)
Monocytes Relative: 11 %
Neutro Abs: 7.4 10*3/uL (ref 1.7–7.7)
Neutrophils Relative %: 75 %
Platelets: 175 10*3/uL (ref 150–400)
RBC: 1.83 MIL/uL — ABNORMAL LOW (ref 4.22–5.81)
RDW: 14.6 % (ref 11.5–15.5)
WBC: 9.9 10*3/uL (ref 4.0–10.5)
nRBC: 0 % (ref 0.0–0.2)

## 2023-02-23 LAB — LIPASE, BLOOD: Lipase: 24 U/L (ref 11–51)

## 2023-02-23 LAB — PREPARE RBC (CROSSMATCH)

## 2023-02-23 LAB — PROTIME-INR
INR: 1.2 (ref 0.8–1.2)
Prothrombin Time: 15.7 seconds — ABNORMAL HIGH (ref 11.4–15.2)

## 2023-02-23 MED ORDER — ACETAMINOPHEN 325 MG PO TABS: 650.0000 mg | ORAL_TABLET | Freq: Four times a day (QID) | ORAL | Status: DC | PRN

## 2023-02-23 MED ORDER — AMLODIPINE BESYLATE 5 MG PO TABS
5.0000 mg | ORAL_TABLET | Freq: Every day | ORAL | Status: DC
  Administered 2023-02-24 – 2023-02-25 (×2): 5 mg via ORAL
  Filled 2023-02-23 (×2): qty 1

## 2023-02-23 MED ORDER — ACETAMINOPHEN 650 MG RE SUPP: 650.0000 mg | Freq: Four times a day (QID) | RECTAL | Status: DC | PRN

## 2023-02-23 MED ORDER — LISINOPRIL 10 MG PO TABS
10.0000 mg | ORAL_TABLET | Freq: Every day | ORAL | Status: DC
  Administered 2023-02-24 – 2023-02-25 (×2): 10 mg via ORAL
  Filled 2023-02-23 (×2): qty 1

## 2023-02-23 MED ORDER — TRAZODONE HCL 50 MG PO TABS: 25.0000 mg | ORAL_TABLET | Freq: Every evening | ORAL | Status: DC | PRN

## 2023-02-23 MED ORDER — OXYCODONE-ACETAMINOPHEN 5-325 MG PO TABS
1.0000 | ORAL_TABLET | Freq: Two times a day (BID) | ORAL | Status: DC | PRN
  Filled 2023-02-23: qty 1

## 2023-02-23 MED ORDER — TRAMADOL HCL 50 MG PO TABS: 50.0000 mg | ORAL_TABLET | Freq: Two times a day (BID) | ORAL | Status: DC | PRN

## 2023-02-23 MED ORDER — PANTOPRAZOLE INFUSION (NEW) - SIMPLE MED
8.0000 mg/h | INTRAVENOUS | Status: DC
  Administered 2023-02-23 – 2023-02-24 (×2): 8 mg/h via INTRAVENOUS
  Filled 2023-02-23 (×6): qty 100

## 2023-02-23 MED ORDER — PANTOPRAZOLE 80MG IVPB - SIMPLE MED
80.0000 mg | Freq: Once | INTRAVENOUS | Status: AC
  Administered 2023-02-23: 80 mg via INTRAVENOUS

## 2023-02-23 MED ORDER — SODIUM CHLORIDE 0.9 % IV SOLN: INTRAVENOUS | Status: DC

## 2023-02-23 MED ORDER — LACTATED RINGERS IV BOLUS
1000.0000 mL | Freq: Once | INTRAVENOUS | Status: AC
  Administered 2023-02-23: 1000 mL via INTRAVENOUS

## 2023-02-23 MED ORDER — ONDANSETRON HCL 4 MG PO TABS: 4.0000 mg | ORAL_TABLET | Freq: Four times a day (QID) | ORAL | Status: DC | PRN

## 2023-02-23 MED ORDER — SODIUM CHLORIDE 0.9% IV SOLUTION: Freq: Once | INTRAVENOUS | Status: DC

## 2023-02-23 MED ORDER — METHOCARBAMOL 500 MG PO TABS: 500.0000 mg | ORAL_TABLET | Freq: Four times a day (QID) | ORAL | Status: DC | PRN

## 2023-02-23 MED ORDER — ONDANSETRON HCL 4 MG/2ML IJ SOLN: 4.0000 mg | Freq: Four times a day (QID) | INTRAMUSCULAR | Status: DC | PRN

## 2023-02-23 NOTE — ED Notes (Signed)
Attempted to call ICU for report with no answer

## 2023-02-23 NOTE — Assessment & Plan Note (Addendum)
-   This is likely hypovolemic. NA+ 132 >>>136 -  Cont  IVF  - will follow his BMP.

## 2023-02-23 NOTE — ED Triage Notes (Signed)
Pt brought in by [facility] from home with c/o vomiting x few days. No vomiting today. Pt got up to ambulate today and felt dizzy every time he went to stand. EMS reports initial BP 98/70 lying, SBP 82 standing, and HR increasing by 30 beats upon standing. 1L NS given by EMS, most recent BP 114/84, HR 90 from EMS. Pt alert and oriented upon arrival to ED.

## 2023-02-23 NOTE — H&P (Addendum)
[place]   PATIENT NAME: Kevin Dalton    MR#:  [id number]  DATE OF BIRTH:  21-Jun-1956  DATE OF ADMISSION:  02/23/2023  PRIMARY CARE PHYSICIAN: Toma Deiters, MD   Patient is coming from: Home  REQUESTING/REFERRING PHYSICIAN: Arthor Captain, PA-C   CHIEF COMPLAINT:   Chief Complaint  Patient presents with   Weakness    HISTORY OF PRESENT ILLNESS:  Kevin Dalton is a 67 y.o. male with medical history significant for hypertension and neurofibromatosis, presented to the emergency room with acute onset of generalized weakness and presyncope.  His birthday was on [birth year] when he had some friends throw a surprise party for him during which he ate pizza and ice cream.  He later that night felt nauseated and had several episodes of vomiting.  He stated that he normally does not eat that type of food and usually sticks to home cooking.  For the last few days he has been feeling fatigue and tired.  Today tried to stand up but felt he was going to pass out.  EMS initially had a BP of 98/70 with a 30 beat increase in his heart rate with standing.  He was given 1 L bolus of IV fluids before arriving to the ER with improvement of his blood pressure.  He has been taking amlodipine and lisinopril regularly the last couple of days despite his fatigue and presyncope.  He admitted to dark stools with mild abdominal discomfort.  No nausea or abdominal pain currently.  No dysuria, nocturia or urgency or frequency or flank pain..  ED Course: When he came to the ER, BP was 96/82 with otherwise normal vital signs.  Labs revealed mild hyponatremia 132 and CO2 21 with a glucose of 114 and BUN 33, calcium 7.8 anion gap 4 with albumin of 2.6 and total protein 4.9.  High sensitive troponin I was 3 and later the same.  CBC showed hemoglobin of 6 and hematocrit 18.4 compared to 11.1 and 31.9 on 10/04/2021 with MCV 100.5 and platelets of 175.  INR was 1.2 and PT 15.7.  Blood group was O+ with negative antibody  screen.  UA was unremarkable.  Stool Hemoccult came back positive. EKG as reviewed by me : Twelve-lead EK EG showed sinus rhythm with a rate of 95 with RSR-in V1 and V2. Imaging: None  The patient was given 1 L bolus of IV lactated Ringer, IV Protonix 80 mg bolus followed by IV Protonix drip and was ordered 2 units of packed red blood cells.  He will be admitted to a stepdown unit bed for further evaluation and management PAST MEDICAL HISTORY:   Past Medical History:  Diagnosis Date   Hypertension    Neurofibromatosis     PAST SURGICAL HISTORY:   Past Surgical History:  Procedure Laterality Date   COLONOSCOPY WITH PROPOFOL N/A 01/23/2021   Procedure: COLONOSCOPY WITH PROPOFOL;  Surgeon: Lanelle Bal, DO;  [facility];  Service: Endoscopy;  Laterality: N/A;  ASA I/II / 12:00   COLONOSCOPY WITH PROPOFOL N/A 09/22/2021   Procedure: COLONOSCOPY WITH PROPOFOL;  Surgeon: Lanelle Bal, DO;  [facility];  Service: Endoscopy;  Laterality: N/A;  10:30 / ASA 2   POLYPECTOMY  01/23/2021   Procedure: POLYPECTOMY;  Surgeon: Lanelle Bal, DO;  [facility];  Service: Endoscopy;;   POLYPECTOMY  09/22/2021   Procedure: POLYPECTOMY;  Surgeon: Lanelle Bal, DO;  [facility];  Service: Endoscopy;;   TOTAL HIP ARTHROPLASTY Right 10/02/2021   Procedure: RIGHT TOTAL HIP ARTHROPLASTY ANTERIOR APPROACH;  Surgeon: Eldred Manges, MD;  [facility];  Service: Orthopedics;  Laterality: Right;    SOCIAL HISTORY:   Social History   Tobacco Use   Smoking status: Every Day    Types: Cigarettes   Smokeless tobacco: Never   Tobacco comments:    Smokes 1-3 cigarettes daily  Substance Use Topics   Alcohol use: Yes    Comment: occasionally    FAMILY HISTORY:   Family History  Problem Relation Age of Onset   Diabetes Sister    Colon cancer Brother     DRUG ALLERGIES:  No Known Allergies  REVIEW OF SYSTEMS:   ROS As per history  of present illness. All pertinent systems were reviewed above. Constitutional, HEENT, cardiovascular, respiratory, GI, GU, musculoskeletal, neuro, psychiatric, endocrine, integumentary and hematologic systems were reviewed and are otherwise negative/unremarkable except for positive findings mentioned above in the HPI.   MEDICATIONS AT HOME:   Prior to Admission medications   Medication Sig Start Date End Date Taking? Authorizing Provider  amLODipine (NORVASC) 5 MG tablet Take 1 tablet (5 mg total) by mouth daily. 10/19/21   Sharee Holster, NP  aspirin EC 325 MG EC tablet Take 1 tablet (325 mg total) by mouth daily with breakfast. 10/06/21   Naida Sleight, PA-C  lisinopril (ZESTRIL) 10 MG tablet Take 1 tablet (10 mg total) by mouth daily. 10/19/21   Sharee Holster, NP  methocarbamol (ROBAXIN) 500 MG tablet Take 1 tablet (500 mg total) by mouth every 6 (six) hours as needed for muscle spasms. 10/19/21   Sharee Holster, NP  NON FORMULARY Diet:Regular    [provider]  oxyCODONE-acetaminophen (PERCOCET/ROXICET) 5-325 MG tablet Take 1 tablet by mouth every 12 (twelve) hours as needed for severe pain. 10/22/21   Eldred Manges, MD  traMADol (ULTRAM) 50 MG tablet Take 1 tablet (50 mg total) by mouth every 12 (twelve) hours as needed. 07/15/22   Eldred Manges, MD      VITAL SIGNS:  Blood pressure 103/71, pulse 84, temperature 98 F (36.7 C), resp. rate 16, height 6\' 6"  (1.981 m), weight 54.4 kg, SpO2 100%.  PHYSICAL EXAMINATION:  Physical Exam  GENERAL:  67 y.o.-year-old African-American male patient lying in the bed with no acute distress.  EYES: Pupils equal, round, reactive to light and accommodation. No scleral icterus.  Positive pallor.  Extraocular muscles intact.  HEENT: Head atraumatic, normocephalic. Oropharynx and nasopharynx clear.  NECK:  Supple, no jugular venous distention. No thyroid enlargement, no tenderness.  LUNGS: Normal breath sounds bilaterally, no wheezing,  rales,rhonchi or crepitation. No use of accessory muscles of respiration.  CARDIOVASCULAR: Regular rate and rhythm, S1, S2 normal. No murmurs, rubs, or gallops.  ABDOMEN: Soft, nondistended, nontender. Bowel sounds present. No organomegaly or mass.  EXTREMITIES: No pedal edema, cyanosis, or clubbing.  NEUROLOGIC: Cranial nerves II through XII are intact. Muscle strength 5/5 in all extremities. Sensation intact. Gait not checked.  PSYCHIATRIC: The patient is alert and oriented x 3.  Normal affect and good eye contact. SKIN: Diffuse neurofibromatosis nodules all over his body.  LABORATORY PANEL:   CBC Recent Labs  Lab 02/23/23 1634  WBC 9.9  HGB 6.0*  HCT 18.4*  PLT 175   ------------------------------------------------------------------------------------------------------------------  Chemistries  Recent Labs  Lab 02/23/23 1634  NA 132*  K 4.3  CL 107  CO2 21*  GLUCOSE  114*  BUN 33*  CREATININE 0.65  CALCIUM 7.8*  AST 12*  ALT 9  ALKPHOS 53  BILITOT 0.4   ------------------------------------------------------------------------------------------------------------------  Cardiac Enzymes No results for input(s): "TROPONINI" in the last 168 hours. ------------------------------------------------------------------------------------------------------------------  RADIOLOGY:  No results found.    IMPRESSION AND PLAN:  Assessment and Plan: * GI bleeding - This is likely of upper GI etiology with associated melena. - The patient will be admitted to a stepdown unit bed. - He was typed and crossmatched and will be transfused units of packed red blood cells for his subsequent acute blood loss anemia. - We will continue IV Protonix drip. - We will follow serial hemoglobins and hematocrits and posttransfusion H&H. - His aspirin will be held off. - GI consult will be obtained. - Dr. Jena Gauss was notified about the patient and is aware.  Hypotension - We we will place his  Norvasc and benazepril with holding parameters with his history of essential hypertension.  Neurofibromatosis ([facility]) - We will continue his pain management and muscle relaxants.  Hyponatremia - This is likely hypovolemic. - He will be hydrated with IV normal saline and will follow his BMP.    DVT prophylaxis: SCDs.  Medical prophylaxis contraindicated due to GI bleeding. Advanced Care Planning:  Code Status: full code. Family Communication:  The plan of care was discussed in details with the patient (and family). I answered all questions. The patient agreed to proceed with the above mentioned plan. Further management will depend upon hospital course. Disposition Plan: Back to previous home environment Consults called: Gastroenterology All the records are reviewed and case discussed with ED provider.  Status is: Inpatient   At the time of the admission, it appears that the appropriate admission status for this patient is inpatient.  This is judged to be reasonable and necessary in order to provide the required intensity of service to ensure the patient's safety given the presenting symptoms, physical exam findings and initial radiographic and laboratory data in the context of comorbid conditions.  The patient requires inpatient status due to high intensity of service, high risk of further deterioration and high frequency of surveillance required.  I certify that at the time of admission, it is my clinical judgment that the patient will require inpatient hospital care extending more than 2 midnights.                            Dispo: The patient is from: Home              Anticipated d/c is to: Home              Patient currently is not medically stable to d/c.              Difficult to place patient: No  Hannah Beat M.D on 02/24/2023 at 12:54 AM  [facility]   From 7 PM-7 AM, contact night-coverage [email or web address]  CC: Primary care physician; Toma Deiters, MD

## 2023-02-23 NOTE — ED Notes (Signed)
Report given to [facility].

## 2023-02-23 NOTE — Assessment & Plan Note (Signed)
-   We will continue his pain management and muscle relaxants.

## 2023-02-23 NOTE — ED Notes (Signed)
Updates provided to sister

## 2023-02-23 NOTE — ED Provider Notes (Signed)
Kevin Dalton EMERGENCY DEPARTMENT AT [facility] Provider Note   CSN: [id number] Arrival date & time: 02/23/23  1504     History  Chief Complaint  Patient presents with   Weakness    Kevin Dalton is a 67 y.o. male who presents emergency department with chief complaint of near syncope.  Patient states that his birthday was this past Monday and he had some friends through a surprise party for him.  He ate a lot of pizza and ice cream and then later that night became sick to his stomach and had several episodes of vomiting.  He normally does not eat that type of food and usually sticks to home cooking.  Patient states that for the past few days he has felt really weak and tired.  Today he tried to stand up and continue to feel like he would pass out.  EMS notes that his lying blood pressure was 98/70 with 30 beat increase in his heart rate with standing.  He was given a liter of fluids prior to arrival with some improvement in his blood pressure.  He does take 2 blood pressure medications including amlodipine and lisinopril and has been taking his medications for the past 2 days despite feeling weak tired and presyncopal.  Denies any nausea or abdominal pain at this time.   Weakness      Home Medications Prior to Admission medications   Medication Sig Start Date End Date Taking? Authorizing Provider  amLODipine (NORVASC) 5 MG tablet Take 1 tablet (5 mg total) by mouth daily. 10/19/21   Sharee Holster, NP  aspirin EC 325 MG EC tablet Take 1 tablet (325 mg total) by mouth daily with breakfast. 10/06/21   Naida Sleight, PA-C  lisinopril (ZESTRIL) 10 MG tablet Take 1 tablet (10 mg total) by mouth daily. 10/19/21   Sharee Holster, NP  methocarbamol (ROBAXIN) 500 MG tablet Take 1 tablet (500 mg total) by mouth every 6 (six) hours as needed for muscle spasms. 10/19/21   Sharee Holster, NP  NON FORMULARY Diet:Regular    [provider]  oxyCODONE-acetaminophen  (PERCOCET/ROXICET) 5-325 MG tablet Take 1 tablet by mouth every 12 (twelve) hours as needed for severe pain. 10/22/21   Eldred Manges, MD  traMADol (ULTRAM) 50 MG tablet Take 1 tablet (50 mg total) by mouth every 12 (twelve) hours as needed. 07/15/22   Eldred Manges, MD      Allergies    Patient has no known allergies.    Review of Systems   Review of Systems  Neurological:  Positive for weakness.    Physical Exam Updated Vital Signs BP 112/76   Pulse 92   Temp 97.7 F (36.5 C) (Oral)   Resp 18   Ht 6\' 6"  (1.981 m)   Wt 54.4 kg   SpO2 98%   BMI 13.87 kg/m  Physical Exam Vitals and nursing note reviewed.  Constitutional:      General: He is not in acute distress.    Appearance: He is well-developed. He is not diaphoretic.  HENT:     Head: Normocephalic and atraumatic.  Eyes:     General: No scleral icterus.    Conjunctiva/sclera: Conjunctivae normal.  Cardiovascular:     Rate and Rhythm: Normal rate and regular rhythm.     Heart sounds: Normal heart sounds.  Pulmonary:     Effort: Pulmonary effort is normal. No respiratory distress.     Breath sounds: Normal breath  sounds.  Abdominal:     Palpations: Abdomen is soft.     Tenderness: There is no abdominal tenderness.  Musculoskeletal:     Cervical back: Normal range of motion and neck supple.  Skin:    General: Skin is warm and dry.  Neurological:     Mental Status: He is alert.  Psychiatric:        Behavior: Behavior normal.     ED Results / Procedures / Treatments   Labs (all labs ordered are listed, but only abnormal results are displayed) Labs Reviewed  CBC WITH DIFFERENTIAL/PLATELET  COMPREHENSIVE METABOLIC PANEL  LIPASE, BLOOD  URINALYSIS, ROUTINE W REFLEX MICROSCOPIC  TROPONIN I (HIGH SENSITIVITY)    EKG EKG Interpretation Date/Time:  Wednesday February 23 2023 15:23:15 EDT Ventricular Rate:  95 PR Interval:  155 QRS Duration:  93 QT Interval:  341 QTC Calculation: 429 R Axis:   70  Text  Interpretation: Sinus rhythm RSR' in V1 or V2, right VCD or RVH Confirmed by Vanetta Mulders [phone]) on 02/23/2023 3:28:51 PM  Radiology No results found.  Procedures .Critical Care  Performed by: Arthor Captain, PA-C Authorized by: Arthor Captain, PA-C   Critical care provider statement:    Critical care time (minutes):  65   Critical care time was exclusive of:  Separately billable procedures and treating other patients   Critical care was necessary to treat or prevent imminent or life-threatening deterioration of the following conditions:  Circulatory failure   Critical care was time spent personally by me on the following activities:  Development of treatment plan with patient or surrogate, discussions with consultants, evaluation of patient's response to treatment, examination of patient, ordering and review of laboratory studies, ordering and review of radiographic studies, ordering and performing treatments and interventions, pulse oximetry, re-evaluation of patient's condition and review of old charts     Medications Ordered in ED Medications  lactated ringers bolus 1,000 mL (1,000 mLs Intravenous New Bag/Given 02/23/23 1621)    ED Course/ Medical Decision Making/ A&P Clinical Course as of 02/25/23 1708  Wed Feb 23, 2023  1717 Hemoglobin(!!): 6.0 Digital Rectal Exam reveals sphincter with good tone. No external hemorrhoids. No masses or fissures. Stool color is frank melena. Patient reports noticing dark stools the night that he vomited. Reports occasional abd pain, none at this time. No heartburn no other episodes of melena  [AH]  2210 I reviewed orders and realized I had not spoken with Hospitalist for admission. Orders placed [AH]    Clinical Course User Index [AH] Arthor Captain, PA-C                             Medical Decision Making Amount and/or Complexity of Data Reviewed Labs: ordered. Decision-making details documented in ED Course.  Risk Decision  regarding hospitalization.   This patient presents to the ED for concern of near syncope, this involves an extensive number of treatment options, and is a complaint that carries with it a high risk of complications and morbidity.  The differential for syncope is extensive and includes, but is not limited to: arrythmia (Vtach, SVT, SSS, sinus arrest, AV block, bradycardia) aortic stenosis, AMI, HOCM, PE, atrial myxoma, pulmonary hypertension, orthostatic hypotension, (hypovolemia, drug effect, GB syndrome, micturition, cough, swall) carotid sinus sensitivity, Seizure, TIA/CVA, hypoglycemia,  Vertigo.   Co morbidities:  has a past medical history of Hypertension and Neurofibromatosis.   Social Determinants of Health:  SDOH Screenings  Food Insecurity: No Food Insecurity (02/23/2023)  Housing: Low Risk  (02/23/2023)  Transportation Needs: No Transportation Needs (02/23/2023)  Utilities: Not At Risk (02/23/2023)  Tobacco Use: High Risk (02/24/2023)     Additional history:  {Additional history obtained from sister at bedside   Lab Tests:  I Ordered, and personally interpreted labs.  The pertinent results include:   .HGB- 6.0 BUN 33 Other findings of hyponatremia at 132, glucose 114, moderate hypocalcemia low protein and albumin and AST levels of insignificant value to workup. Bone within normal limits Imaging Studies:  N/A  Cardiac Monitoring/ECG:  The patient was maintained on a cardiac monitor.  I personally viewed and interpreted the cardiac monitored which showed an underlying rhythm of: Sinus rhythm borderline tachycardia  Medicines ordered and prescription drug management:  I ordered medication including  Medications                            pantoprazole (PROTONIX) injection 40 mg (40 mg Intravenous Given 02/25/23 0811)  lactated ringers bolus 1,000 mL (0 mLs Intravenous Stopped 02/23/23 1736)  pantoprazole (PROTONIX) 80 mg /NS 100 mL IVPB (0 mg Intravenous  Stopped 02/23/23 1954)  2 units of packed red blood cells Reevaluation of the patient after these medicines showed that the patient improved I have reviewed the patients home medicines and have made adjustments as needed  Test Considered:   I considered CT imaging of the abdomen however patient is without any abdominal pain at this time.  Critical Interventions:   blood transfusion for profound anemia and GI hemorrhage  Consultations Obtained: Patient here with GI hemorrhage.  Case discussed with Dr. Val Eagle' Rourke  Patient should remain n.p.o. after midnight.  Case also discussed with Dr. Arville Care will admit the patient for [facility]  Problem List / ED Course:     ICD-10-CM   1. Gastrointestinal hemorrhage with melena  K92.1       MDM: Patient here with nausea vomiting and diarrhea 4 nights ago, persistent hypotension and near syncope with GI hemorrhage with melena.  Given 2 units of red blood cells here.  Stable throughout ED visit.  Patient admitted to the hospital for further workup.   Dispostion:  After consideration of the diagnostic results and the patients response to treatment, I feel that the patent would benefit from mission.         Final Clinical Impression(s) / ED Diagnoses Final diagnoses:  Gastrointestinal hemorrhage with melena    Rx / DC Orders ED Discharge Orders     None         Arthor Captain, PA-C 02/25/23 1714    Vanetta Mulders, MD 02/28/23 [phone]

## 2023-02-23 NOTE — ED Notes (Signed)
Attempted orthostatic VS.  Sat patient up to bedside and was unable to continue due to overwhelming weakness, per pt.  Pt lying in bed, VSS, RN Jessica notified.

## 2023-02-23 NOTE — Assessment & Plan Note (Addendum)
-   Melena, possibly upper GI bleed -Cute blood loss anemia, status post 2U PRBC transfusion  - We will continue IV Protonix drip. - We will follow serial hemoglobins and hematocrits and posttransfusion H&H. - His aspirin will be held off. - GI consult will be obtained. - Dr. Jena Gauss was notified about the patient and is aware.    Latest Ref Rng & Units 02/24/2023    4:48 AM 02/23/2023    4:34 PM 10/04/2021    1:01 AM  CBC  WBC 4.0 - 10.5 K/uL 12.0  9.9  13.3   Hemoglobin 13.0 - 17.0 g/dL 7.7  6.0  95.1   Hematocrit 39.0 - 52.0 % 23.5  18.4  31.9   Platelets 150 - 400 K/uL 138  175  209    NPO  Anticipating GI workup possible EGD today

## 2023-02-23 NOTE — Assessment & Plan Note (Signed)
-   We we will place his Norvasc and benazepril with holding parameters with his history of essential hypertension.

## 2023-02-24 ENCOUNTER — Inpatient Hospital Stay (HOSPITAL_COMMUNITY): Payer: Medicare HMO | Admitting: Certified Registered Nurse Anesthetist

## 2023-02-24 ENCOUNTER — Encounter (HOSPITAL_COMMUNITY): Payer: Self-pay | Admitting: Family Medicine

## 2023-02-24 ENCOUNTER — Encounter (HOSPITAL_COMMUNITY)
Admission: EM | Disposition: A | Discharge status: Home / Self Care | Payer: Self-pay | Source: Home / Self Care | Attending: Family Medicine

## 2023-02-24 DIAGNOSIS — K2960 Other gastritis without bleeding: Secondary | ICD-10-CM | POA: Diagnosis not present

## 2023-02-24 DIAGNOSIS — K2970 Gastritis, unspecified, without bleeding: Secondary | ICD-10-CM

## 2023-02-24 DIAGNOSIS — B9681 Helicobacter pylori [H. pylori] as the cause of diseases classified elsewhere: Secondary | ICD-10-CM

## 2023-02-24 DIAGNOSIS — K259 Gastric ulcer, unspecified as acute or chronic, without hemorrhage or perforation: Secondary | ICD-10-CM | POA: Diagnosis not present

## 2023-02-24 DIAGNOSIS — K625 Hemorrhage of anus and rectum: Secondary | ICD-10-CM | POA: Diagnosis not present

## 2023-02-24 DIAGNOSIS — I10 Essential (primary) hypertension: Secondary | ICD-10-CM | POA: Diagnosis not present

## 2023-02-24 DIAGNOSIS — K279 Peptic ulcer, site unspecified, unspecified as acute or chronic, without hemorrhage or perforation: Secondary | ICD-10-CM

## 2023-02-24 DIAGNOSIS — F17210 Nicotine dependence, cigarettes, uncomplicated: Secondary | ICD-10-CM | POA: Diagnosis not present

## 2023-02-24 HISTORY — PX: ESOPHAGOGASTRODUODENOSCOPY (EGD) WITH PROPOFOL: SHX5813

## 2023-02-24 HISTORY — PX: BIOPSY: SHX5522

## 2023-02-24 LAB — BPAM RBC
Blood Product Expiration Date: 202408242359
Blood Product Expiration Date: 202408242359
ISSUE DATE / TIME: 202407172001
ISSUE DATE / TIME: 202407172213
Unit Type and Rh: 5100
Unit Type and Rh: 5100

## 2023-02-24 LAB — BASIC METABOLIC PANEL
Anion gap: 5 (ref 5–15)
BUN: 29 mg/dL — ABNORMAL HIGH (ref 8–23)
CO2: 21 mmol/L — ABNORMAL LOW (ref 22–32)
Calcium: 8 mg/dL — ABNORMAL LOW (ref 8.9–10.3)
Chloride: 110 mmol/L (ref 98–111)
Creatinine, Ser: 0.67 mg/dL (ref 0.61–1.24)
GFR, Estimated: >60 mL/min (ref >60)
Glucose, Bld: 95 mg/dL (ref 70–99)
Potassium: 4.1 mmol/L (ref 3.5–5.1)
Sodium: 136 mmol/L (ref 135–145)

## 2023-02-24 LAB — HIV ANTIBODY (ROUTINE TESTING W REFLEX): HIV Screen 4th Generation wRfx: NONREACTIVE

## 2023-02-24 LAB — TYPE AND SCREEN
ABO/RH(D): O POS
Antibody Screen: NEGATIVE
Unit division: 0
Unit division: 0

## 2023-02-24 LAB — CBC
HCT: 23.5 % — ABNORMAL LOW (ref 39.0–52.0)
Hemoglobin: 7.7 g/dL — ABNORMAL LOW (ref 13.0–17.0)
MCH: 32 pg (ref 26.0–34.0)
MCHC: 32.8 g/dL (ref 30.0–36.0)
MCV: 97.5 fL (ref 80.0–100.0)
Platelets: 138 10*3/uL — ABNORMAL LOW (ref 150–400)
RBC: 2.41 MIL/uL — ABNORMAL LOW (ref 4.22–5.81)
RDW: 14.5 % (ref 11.5–15.5)
WBC: 12 10*3/uL — ABNORMAL HIGH (ref 4.0–10.5)
nRBC: 0 % (ref 0.0–0.2)

## 2023-02-24 LAB — MRSA NEXT GEN BY PCR, NASAL: MRSA by PCR Next Gen: NOT DETECTED

## 2023-02-24 SURGERY — ESOPHAGOGASTRODUODENOSCOPY (EGD) WITH PROPOFOL
Anesthesia: General

## 2023-02-24 MED ORDER — CHLORHEXIDINE GLUCONATE CLOTH 2 % EX PADS
6.0000 | MEDICATED_PAD | Freq: Every day | CUTANEOUS | Status: DC
  Administered 2023-02-24: 6 via TOPICAL

## 2023-02-24 MED ORDER — PROPOFOL 10 MG/ML IV BOLUS
INTRAVENOUS | Status: DC | PRN
Start: 2023-02-24 — End: 2023-02-24
  Administered 2023-02-24: 40 mg via INTRAVENOUS
  Administered 2023-02-24: 100 mg via INTRAVENOUS
  Administered 2023-02-24: 60 mg via INTRAVENOUS
  Administered 2023-02-24: 25 mg via INTRAVENOUS

## 2023-02-24 MED ORDER — LIDOCAINE HCL (CARDIAC) PF 100 MG/5ML IV SOSY
PREFILLED_SYRINGE | INTRAVENOUS | Status: DC | PRN
  Administered 2023-02-24: 50 mg via INTRAVENOUS

## 2023-02-24 MED ORDER — LACTATED RINGERS IV SOLN: INTRAVENOUS | Status: DC | PRN

## 2023-02-24 MED ORDER — PANTOPRAZOLE SODIUM 40 MG IV SOLR
40.0000 mg | Freq: Two times a day (BID) | INTRAVENOUS | Status: DC
  Administered 2023-02-24 – 2023-02-25 (×2): 40 mg via INTRAVENOUS
  Filled 2023-02-24 (×3): qty 10

## 2023-02-24 MED ORDER — PHENYLEPHRINE HCL (PRESSORS) 10 MG/ML IV SOLN
INTRAVENOUS | Status: DC | PRN
  Administered 2023-02-24: 80 ug via INTRAVENOUS
  Administered 2023-02-24: 160 ug via INTRAVENOUS
  Administered 2023-02-24 (×2): 80 ug via INTRAVENOUS

## 2023-02-24 NOTE — Progress Notes (Addendum)
PROGRESS NOTE    Patient: Kevin Dalton                            PCP: Toma Deiters, MD                    DOB: [birth year]            DOA: 02/23/2023 [id number]:[id number]             DOS: 02/24/2023, 1:00 PM   LOS: 1 day   Date of Service: The patient was seen and examined on 02/24/2023  Subjective:   The patient was seen and examined this morning. Hemodynamically stable. No issues overnight .  Brief Narrative:   Kevin Dalton is a 67 y.o. male with medical history significant for hypertension and neurofibromatosis, presented to the emergency room with acute onset of generalized weakness and presyncope.   His birthday was on Monday when he had some friends throw a surprise party for him during which he ate pizza and ice cream.  He later that night felt nauseated and had several episodes of vomiting.  He stated that he normally does not eat that type of food and usually sticks to home cooking.  For the last few days he has been feeling fatigue and tired.  Today tried to stand up but felt he was going to pass out.   EMS initially had a BP of 98/70 with a 30 beat increase in his heart rate with standing.  He was given 1 L bolus of IV fluids before arriving to the ER with improvement of his blood pressure.  He has been taking amlodipine and lisinopril regularly the last couple of days despite his fatigue and presyncope.  He admitted to dark stools with mild abdominal discomfort.  No nausea or abdominal pain currently.  No dysuria, nocturia or urgency or frequency or flank pain..   ED Course:  PB 96/82 with otherwise normal vital signs.   LABs:  Hyponatremia 132 and CO2 21 with a glucose of 114 and BUN 33, calcium 7.8 anion gap 4 with albumin of 2.6 and total protein 4.9.    Troponin I was 3 and later the same.   CBC showed hemoglobin of 6 and hematocrit 18.4 compared to 11.1 and 31.9 on 10/04/2021 with MCV 100.5 and platelets of 175.  INR was 1.2 and PT 15.7.  Blood group was O+ with negative  antibody screen.   UA was unremarkable.  Stool Hemoccult came back positive. EKG as reviewed by me : Twelve-lead EK EG showed sinus rhythm with a rate of 95 with RSR-in V1 and V2. Imaging: None  The patient was given 1 L bolus of IV lactated Ringer, IV Protonix 80 mg bolus followed by IV Protonix drip and was ordered 2 units of packed red blood cells.  He will be admitted to a stepdown unit bed for further evaluation and managemen     Assessment & Plan:   Principal Problem:   GI bleeding Active Problems:   Hypotension   Neurofibromatosis (HCC)   Hyponatremia     Assessment and Plan: * GI bleeding - Melena, possibly upper GI bleed -Cute blood loss anemia, status post 2U PRBC transfusion  - We will continue IV Protonix drip. - We will follow serial hemoglobins and hematocrits and posttransfusion H&H. - His aspirin will be held off. - GI consult will be obtained. - Dr. Jena Gauss was notified  about the patient and is aware.    Latest Ref Rng & Units 02/24/2023    4:48 AM 02/23/2023    4:34 PM 10/04/2021    1:01 AM  CBC  WBC 4.0 - 10.5 K/uL 12.0  9.9  13.3   Hemoglobin 13.0 - 17.0 g/dL 7.7  6.0  16.1   Hematocrit 39.0 - 52.0 % 23.5  18.4  31.9   Platelets 150 - 400 K/uL 138  175  209    NPO  Anticipating GI workup possible EGD today   Hypotension - We we will place his Norvasc and benazepril with holding parameters with his history of essential hypertension.  Neurofibromatosis (HCC) - We will continue his pain management and muscle relaxants.  Hyponatremia - This is likely hypovolemic. NA+ 132 >>>136 -  Cont  IVF  - will follow his BMP.    ------------------------------------------------------------------------------------------------------------------------------- Nutritional status:  The patient's BMI is: Body mass index is 16.18 kg/m. I agree with the assessment and plan as outlined  ---------------------------------------------------------------------------------------------------------------------------------  DVT prophylaxis:  SCDs Start: 02/23/23 2300   Code Status:   Code Status: Full Code  Family Communication: No family member present at bedside- attempt will be made to update daily The above findings and plan of care has been discussed with patient (and family)  in detail,  they expressed understanding and agreement of above. -Advance care planning has been discussed.   Admission status:   Status is: Inpatient Remains inpatient appropriate because: Needing blood transfusion, GI evaluation and possible intervention   Disposition: From  - home             Planning for discharge in 1-2 days: to   Procedures:   No admission procedures for hospital encounter.   Antimicrobials:  Anti-infectives (From admission, onward)    None        Medication:   sodium chloride   Intravenous Once   amLODipine  5 mg Oral Daily   Chlorhexidine Gluconate Cloth  6 each Topical Q0600   lisinopril  10 mg Oral Daily    acetaminophen **OR** acetaminophen, methocarbamol, ondansetron **OR** ondansetron (ZOFRAN) IV, oxyCODONE-acetaminophen, traMADol, traZODone   Objective:   Vitals:   02/24/23 0055 02/24/23 0400 02/24/23 0825 02/24/23 0908  BP: 131/77   112/78  Pulse: 84     Resp: 18     Temp: 97.9 F (36.6 C) 97.6 F (36.4 C) (!) 97.5 F (36.4 C) 97.6 F (36.4 C)  TempSrc: Oral Oral Oral Oral  SpO2: 100%     Weight: 54.1 kg     Height: 6' (1.829 m)       Intake/Output Summary (Last 24 hours) at 02/24/2023 1300 Last data filed at 02/24/2023 0900 Gross per 24 hour  Intake 1010.25 ml  Output 750 ml  Net 260.25 ml   Filed Weights   02/23/23 1520 02/24/23 0055  Weight: 54.4 kg 54.1 kg     Physical examination:   Constitution:  Alert, cooperative, no distress,  Appears calm and comfortable  Psychiatric:   Normal and stable mood and affect,  cognition intact,   HEENT:        Normocephalic, PERRL, otherwise with in Normal limits  Chest:         Chest symmetric Cardio vascular:  S1/S2, RRR, No murmure, No Rubs or Gallops  pulmonary: Clear to auscultation bilaterally, respirations unlabored, negative wheezes / crackles Abdomen: Soft, non-tender, non-distended, bowel sounds,no masses, no organomegaly Muscular skeletal: Limited exam - in bed, able to move all  4 extremities,   Neuro: CNII-XII intact. , normal motor and sensation, reflexes intact  Extremities: No pitting edema lower extremities, +2 pulses  Skin: Dry, warm to touch, negative for any Rashes, No open wounds Wounds: per nursing documentation   ------------------------------------------------------------------------------------------------------------------------------------------    LABs:     Latest Ref Rng & Units 02/24/2023    4:48 AM 02/23/2023    4:34 PM 10/04/2021    1:01 AM  CBC  WBC 4.0 - 10.5 K/uL 12.0  9.9  13.3   Hemoglobin 13.0 - 17.0 g/dL 7.7  6.0  57.8   Hematocrit 39.0 - 52.0 % 23.5  18.4  31.9   Platelets 150 - 400 K/uL 138  175  209       Latest Ref Rng & Units 02/24/2023    4:48 AM 02/23/2023    4:34 PM 10/03/2021    1:08 AM  CMP  Glucose 70 - 99 mg/dL 95  469  629   BUN 8 - 23 mg/dL 29  33  9   Creatinine 0.61 - 1.24 mg/dL 5.28  4.13  2.44   Sodium 135 - 145 mmol/L 136  132  131   Potassium 3.5 - 5.1 mmol/L 4.1  4.3  4.1   Chloride 98 - 111 mmol/L 110  107  102   CO2 22 - 32 mmol/L 21  21  21    Calcium 8.9 - 10.3 mg/dL 8.0  7.8  8.5   Total Protein 6.5 - 8.1 g/dL  4.9    Total Bilirubin 0.3 - 1.2 mg/dL  0.4    Alkaline Phos 38 - 126 U/L  53    AST 15 - 41 U/L  12    ALT 0 - 44 U/L  9         Micro Results Recent Results (from the past 240 hour(s))  MRSA Next Gen by PCR, Nasal     Status: None   Collection Time: 02/24/23 12:25 AM   Specimen: Nasal Mucosa; Nasal Swab  Result Value Ref Range Status   MRSA by PCR Next Gen NOT  DETECTED NOT DETECTED Final    Comment: (NOTE) The GeneXpert MRSA Assay (FDA approved for NASAL specimens only), is one component of a comprehensive MRSA colonization surveillance program. It is not intended to diagnose MRSA infection nor to guide or monitor treatment for MRSA infections. Test performance is not FDA approved in patients less than 78 years old. Performed at [facility], [address], [place], [place] [address]     Radiology Reports No results found.  SIGNED: Kendell Bane, MD, FHM. FAAFP. [facility] - [facility] Time spent - 55 min.  In seeing, evaluating and examining the patient. Reviewing medical records, labs, drawn plan of care. [facility],  Pager (please use [email or web address] to page/ text) Please use Epic Secure Chat for non-urgent communication (7AM-7PM)  If 7PM-7AM, please contact night-coverage [email or web address], 02/24/2023, 1:00 PM

## 2023-02-24 NOTE — Transfer of Care (Signed)
Immediate Anesthesia Transfer of Care Note  Patient: Kevin Dalton  Procedure(s) Performed: ESOPHAGOGASTRODUODENOSCOPY (EGD) WITH PROPOFOL BIOPSY  Patient Location: PACU  Anesthesia Type:General  Level of Consciousness: awake and patient cooperative  Airway & Oxygen Therapy: Patient Spontanous Breathing and Patient connected to nasal cannula oxygen  Post-op Assessment: Report given to RN and Post -op Vital signs reviewed and stable  Post vital signs: Reviewed and stable  Last Vitals:  Vitals Value Taken Time  BP 97/68 02/24/23 1711  Temp 97.5 02/24/23  1812  Pulse 78 02/24/23 1711  Resp 18 02/24/23 1711  SpO2 98 % 02/24/23 1711  Vitals shown include unfiled device data.  Last Pain:  Vitals:   02/24/23 1641  TempSrc:   PainSc: 0-No pain         Complications: No notable events documented.

## 2023-02-24 NOTE — Anesthesia Postprocedure Evaluation (Signed)
Anesthesia Post Note  Patient: TYRICE HEWITT  Procedure(s) Performed: ESOPHAGOGASTRODUODENOSCOPY (EGD) WITH PROPOFOL BIOPSY  Patient location during evaluation: PACU Anesthesia Type: General Level of consciousness: awake and alert and oriented Pain management: pain level controlled Vital Signs Assessment: post-procedure vital signs reviewed and stable Respiratory status: spontaneous breathing, nonlabored ventilation and respiratory function stable Cardiovascular status: blood pressure returned to baseline and stable Postop Assessment: no apparent nausea or vomiting Anesthetic complications: no  No notable events documented.   Last Vitals:  Vitals:   02/24/23 1730 02/24/23 1749  BP: 116/76 119/74  Pulse: 75 67  Resp: 14 16  Temp:  (!) 36.3 C  SpO2: 100%     Last Pain:  Vitals:   02/24/23 1749  TempSrc: Oral  PainSc: 0-No pain                 Rahshawn Remo C Pierrette Scheu

## 2023-02-24 NOTE — Op Note (Signed)
[facility] Patient Name: Kevin Dalton Procedure Date: 02/24/2023 4:17 PM MRN: [id number] Date of Birth: 06-26-1956 Attending MD: Sanjuan Dame , MD, [id number] CSN: [id number] Age: 67 Admit Type: Outpatient Procedure:                Upper GI endoscopy Indications:              Acute post hemorrhagic anemia Providers:                Sanjuan Dame, MD, Sheran Fava, Dyann Ruddle Referring MD:              Medicines:                Monitored Anesthesia Care Complications:            No immediate complications. Estimated blood loss:                            Minimal. Estimated Blood Loss:      Procedure:                Pre-Anesthesia Assessment:                           - Prior to the procedure, a History and Physical                            was performed, and patient medications and                            allergies were reviewed. The patient's tolerance of                            previous anesthesia was also reviewed. The risks                            and benefits of the procedure and the sedation                            options and risks were discussed with the patient.                            All questions were answered, and informed consent                            was obtained. Prior Anticoagulants: The patient has                            taken no anticoagulant or antiplatelet agents                            except for aspirin. ASA Grade Assessment: II - A                            patient with mild systemic disease. After reviewing  the risks and benefits, the patient was deemed in                            satisfactory condition to undergo the procedure.                           After obtaining informed consent, the endoscope was                            passed under direct vision. Throughout the                            procedure, the patient's blood pressure, pulse, and                            oxygen  saturations were monitored continuously. The                            [id number]-[id number] ([id number]) scope was introduced through the                            mouth, and advanced to the second part of duodenum.                            The upper GI endoscopy was accomplished without                            difficulty. The patient tolerated the procedure                            well. Scope In: 4:41:02 PM Scope Out: 4:53:44 PM Total Procedure Duration: 0 hours 12 minutes 42 seconds  Findings:      The examined esophagus was normal.      One non-bleeding cratered gastric ulcer with pigmented material was       found in the gastric antrum. The lesion was 9 mm in largest dimension.       Biopsies were taken with a cold forceps for histology.      Deformed antrum and pylorus .      Diffuse moderate inflammation characterized by erythema was found in the       stomach. Biopsies were taken with a cold forceps for histology.      The duodenal bulb and second portion of the duodenum were normal. Impression:               - Normal esophagus.                           - Non-bleeding gastric ulcer with pigmented                            material [facility] IIc). Biopsied.                           - Erosive gastritis. Biopsied.                           -  Normal duodenal bulb and second portion of the                            duodenum.                           - Acquired deformity in the gastric antrum                           - Deformed antrum and pylorus . Moderate Sedation:      Per Anesthesia Care Recommendation:           - Await pathology results.                           - Repeat upper endoscopy in 8 weeks to check                            healing.                           - Return to GI clinic in 8 weeks.                           -Avoid NSAIDs, patient is on aspirin 325mg                            -If biopsies positive for H.Pylori treat likewise                           - PPI  40mg  , daily for 8 weeks Procedure Code(s):        --- Professional ---                           [phone], Esophagogastroduodenoscopy, flexible,                            transoral; with biopsy, single or multiple Diagnosis Code(s):        --- Professional ---                           K25.9, Gastric ulcer, unspecified as acute or                            chronic, without hemorrhage or perforation                           K29.60, Other gastritis without bleeding                           K31.89, Other diseases of stomach and duodenum                           D62, Acute posthemorrhagic anemia CPT copyright 2022 American Medical Association. All rights reserved. The codes documented in this report are preliminary and upon coder review may  be revised to meet current compliance requirements. [facility]  Tasia Catchings, MD Sanjuan Dame, MD 02/24/2023 5:09:22 PM This report has been signed electronically. Number of Addenda: 0

## 2023-02-24 NOTE — Brief Op Note (Signed)
Patient underwent EGD with biopsies under propofol sedation.  Tolerated the procedure adequately.   FINDINGS:  - Normal esophagus.  - Non-bleeding gastric ulcer with pigmented material [facility] IIc).  Biopsied.  - Erosive gastritis.  Biopsied.  - Normal duodenal bulb and second portion of the duodenum.  - Deformed antrum and pylorus   RECOMMENDATIONS  - Await pathology results.  - Repeat upper endoscopy in 8 weeks to check healing as outpatient   -Avoid NSAIDs, patient is on aspirin 325mg  -If biopsies positive for H.Pylori treat likewise - PPI 40mg  , daily for 8 weeks  Vista Lawman, MD Gastroenterology and Hepatology [facility]

## 2023-02-24 NOTE — Hospital Course (Addendum)
Kevin Dalton is a 67 y.o. male with medical history significant for hypertension and neurofibromatosis, presented to the emergency room with acute onset of generalized weakness and presyncope.   His birthday was on [birth year] when he had some friends throw a surprise party for him during which he ate pizza and ice cream.  He later that night felt nauseated and had several episodes of vomiting.  He stated that he normally does not eat that type of food and usually sticks to home cooking.  For the last few days he has been feeling fatigue and tired.  Today tried to stand up but felt he was going to pass out.   EMS initially had a BP of 98/70 with a 30 beat increase in his heart rate with standing.  He was given 1 L bolus of IV fluids before arriving to the ER with improvement of his blood pressure.  He has been taking amlodipine and lisinopril regularly the last couple of days despite his fatigue and presyncope.  He admitted to dark stools with mild abdominal discomfort.  No nausea or abdominal pain currently.  No dysuria, nocturia or urgency or frequency or flank pain..   ED Course:  PB 96/82 with otherwise normal vital signs.   LABs:  Hyponatremia 132 and CO2 21 with a glucose of 114 and BUN 33, calcium 7.8 anion gap 4 with albumin of 2.6 and total protein 4.9.    Troponin I was 3 and later the same.   CBC showed hemoglobin of 6 and hematocrit 18.4 compared to 11.1 and 31.9 on 10/04/2021 with MCV 100.5 and platelets of 175.  INR was 1.2 and PT 15.7.  Blood group was O+ with negative antibody screen.   UA was unremarkable.  Stool Hemoccult came back positive. EKG as reviewed by me : Twelve-lead EK EG showed sinus rhythm with a rate of 95 with RSR-in V1 and V2. Imaging: None  The patient was given 1 L bolus of IV lactated Ringer, IV Protonix 80 mg bolus followed by IV Protonix drip and was ordered 2 units of packed red blood cells.  He will be admitted to a stepdown unit bed for further evaluation and  managemen

## 2023-02-24 NOTE — Plan of Care (Signed)

## 2023-02-24 NOTE — Consult Note (Signed)
Gastroenterology Consult   Referring Provider: No ref. provider found Primary Care Physician:  Toma Deiters, MD Primary Gastroenterologist:  Dr. Marletta Lor  Patient ID: Kevin Dalton; [id number]; [birth year]   Admit date: 02/23/2023  LOS: 1 day   Date of Consultation: 02/24/2023  Reason for Consultation:  GI bleed    History of Present Illness   Kevin Dalton is a 67 y.o. male with PMH of neurofibromatosis, HTN, adenomatous colon polyps brought in by EMS for several days of vomiting, lightheadedness, generalized weakness.   Patient reports that he ate a couple of slices of pizza and ice cream pie for his birthday on Monday, then later that night he began vomiting.  Emesis was dark but he had eaten chocolate ice cream.  He believes he did see some blood in it.  He denies any melena or rectal bleeding.  He has been fatigued. When trying to stand up he developed presyncopal feelings. Initial BP by EMS was 98/70 lying. Positive orthostatics.  Really denies any significant abdominal pain.  No history of heartburn, dysphagia.  Bowel movements typically pretty regular but he has not eating for a few days so he has not had any further stools.  He rarely takes aspirin powders.  Typically uses Tylenol for pain. ASA 325mg  daily.  Drinks 16 ounces of beer most evenings but denies history of heavy alcohol use.  In the ED: Hgb 6 (down from 11.1 in 09/2021), platelets 175000, sodium 132, BUN 33, Cre 0.65, INR 1.2, heme positive stool, albumin 2.6.   Today: BUN 29, sodium 136, Hgb 7.7 after two units of prbcs.  Colonoscopy 09/2021: -nonbleeding internal hemorrhoids -three 3-64mm polyps in the descending colon, tubular adenoma -3 year surveillance colonoscopy  Colonoscopy 01/2021: -nonbleeding internal hemorrhoids -one 10mm polyp in rectum -one 12mm polyp in the descending colon -one 22mm polyp in descending colon -tubulovillous adenoma and tubular adenoma  No prior EGD.   Prior to Admission  medications   Medication Sig Start Date End Date Taking? Authorizing Provider  amLODipine (NORVASC) 5 MG tablet Take 1 tablet (5 mg total) by mouth daily. 10/19/21   Sharee Holster, NP  aspirin EC 325 MG EC tablet Take 1 tablet (325 mg total) by mouth daily with breakfast. 10/06/21   Naida Sleight, PA-C  lisinopril (ZESTRIL) 10 MG tablet Take 1 tablet (10 mg total) by mouth daily. 10/19/21   Sharee Holster, NP  methocarbamol (ROBAXIN) 500 MG tablet Take 1 tablet (500 mg total) by mouth every 6 (six) hours as needed for muscle spasms. 10/19/21   Sharee Holster, NP  NON FORMULARY Diet:Regular    [provider]  oxyCODONE-acetaminophen (PERCOCET/ROXICET) 5-325 MG tablet Take 1 tablet by mouth every 12 (twelve) hours as needed for severe pain. 10/22/21   Eldred Manges, MD  traMADol (ULTRAM) 50 MG tablet Take 1 tablet (50 mg total) by mouth every 12 (twelve) hours as needed. 07/15/22   Eldred Manges, MD    Current Facility-Administered Medications  Medication Dose Route Frequency Provider Last Rate Last Admin   0.9 %  sodium chloride infusion (Manually program via Guardrails IV Fluids)   Intravenous Once Arthor Captain, PA-C   Stopped at 02/23/23 1808   0.9 %  sodium chloride infusion   Intravenous Continuous Mansy, Jan A, MD 100 mL/hr at 02/24/23 0141 New Bag at 02/24/23 0141   acetaminophen (TYLENOL) tablet 650 mg  650 mg Oral Q6H PRN Mansy, Vernetta Honey, MD  Or   acetaminophen (TYLENOL) suppository 650 mg  650 mg Rectal Q6H PRN Mansy, Jan A, MD       amLODipine (NORVASC) tablet 5 mg  5 mg Oral Daily Mansy, Jan A, MD       Chlorhexidine Gluconate Cloth 2 % PADS 6 each  6 each Topical Q0600 Mansy, Vernetta Honey, MD   6 each at 02/24/23 0705   lisinopril (ZESTRIL) tablet 10 mg  10 mg Oral Daily Mansy, Jan A, MD       methocarbamol (ROBAXIN) tablet 500 mg  500 mg Oral Q6H PRN Mansy, Jan A, MD       ondansetron [facility]) tablet 4 mg  4 mg Oral Q6H PRN Mansy, Jan A, MD       Or   ondansetron  [facility]) injection 4 mg  4 mg Intravenous Q6H PRN Mansy, Vernetta Honey, MD       oxyCODONE-acetaminophen (PERCOCET/ROXICET) 5-325 MG per tablet 1 tablet  1 tablet Oral Q12H PRN Mansy, Jan A, MD       pantoprozole (PROTONIX) 80 mg /NS 100 mL infusion  8 mg/hr Intravenous Continuous Arthor Captain, PA-C 10 mL/hr at 02/24/23 0448 8 mg/hr at 02/24/23 0448   traMADol (ULTRAM) tablet 50 mg  50 mg Oral Q12H PRN Mansy, Jan A, MD       traZODone (DESYREL) tablet 25 mg  25 mg Oral QHS PRN Mansy, Vernetta Honey, MD        Allergies as of 02/23/2023   (No Known Allergies)    Past Medical History:  Diagnosis Date   Hypertension    Neurofibromatosis     Past Surgical History:  Procedure Laterality Date   COLONOSCOPY WITH PROPOFOL N/A 01/23/2021   Procedure: COLONOSCOPY WITH PROPOFOL;  Surgeon: Lanelle Bal, DO;  [facility];  Service: Endoscopy;  Laterality: N/A;  ASA I/II / 12:00   COLONOSCOPY WITH PROPOFOL N/A 09/22/2021   Procedure: COLONOSCOPY WITH PROPOFOL;  Surgeon: Lanelle Bal, DO;  Location: [facility];  Service: Endoscopy;  Laterality: N/A;  10:30 / ASA 2   POLYPECTOMY  01/23/2021   Procedure: POLYPECTOMY;  Surgeon: Lanelle Bal, DO;  Location: [facility];  Service: Endoscopy;;   POLYPECTOMY  09/22/2021   Procedure: POLYPECTOMY;  Surgeon: Lanelle Bal, DO;  Location: [facility];  Service: Endoscopy;;   TOTAL HIP ARTHROPLASTY Right 10/02/2021   Procedure: RIGHT TOTAL HIP ARTHROPLASTY ANTERIOR APPROACH;  Surgeon: Eldred Manges, MD;  Location: [facility] OR;  Service: Orthopedics;  Laterality: Right;    Family History  Problem Relation Age of Onset   Diabetes Sister    Colon cancer Brother    Leukemia Brother     Social History   Socioeconomic History   Marital status: Single    Spouse name: Not on file   Number of children: Not on file   Years of education: Not on file   Highest education level: Not on file  Occupational History   Not on file  Tobacco Use    Smoking status: Every Day    Types: Cigarettes   Smokeless tobacco: Never   Tobacco comments:    Smokes 1-3 cigarettes daily  Vaping Use   Vaping status: Never Used  Substance and Sexual Activity   Alcohol use: Yes    Comment: 16 ounces of beer most nights, denies history of heavy use   Drug use: No   Sexual activity: Not on file  Other Topics Concern  Not on file  Social History Narrative   Not on file   Social Determinants of Health   Financial Resource Strain: Not on file  Food Insecurity: No Food Insecurity (02/23/2023)   Hunger Vital Sign    Worried About Running Out of Food in the Last Year: Never true    Ran Out of Food in the Last Year: Never true  Transportation Needs: No Transportation Needs (02/23/2023)   PRAPARE - Administrator, Civil Service (Medical): No    Lack of Transportation (Non-Medical): No  Physical Activity: Not on file  Stress: Not on file  Social Connections: Not on file  Intimate Partner Violence: Not At Risk (02/23/2023)   Humiliation, Afraid, Rape, and Kick questionnaire    Fear of Current or Ex-Partner: No    Emotionally Abused: No    Physically Abused: No    Sexually Abused: No     Review of System:   General: Negative for anorexia, weight loss, fever, chills, fatigue, +weakness. Eyes: Negative for vision changes.  ENT: Negative for hoarseness, difficulty swallowing, nasal congestion. CV: Negative for chest pain, angina, palpitations, dyspnea on exertion, peripheral edema.  Respiratory: Negative for dyspnea at rest, dyspnea on exertion, +cough, sputum, wheezing.  GI: See history of present illness. GU:  Negative for dysuria, hematuria, urinary incontinence, urinary frequency, nocturnal urination.  MS: Negative for joint pain, low back pain.  Derm: Negative for rash or itching. TNTC neurofibromas Neuro: Negative for weakness, abnormal sensation, seizure, frequent headaches, memory loss, confusion.  Psych: Negative for anxiety,  depression, suicidal ideation, hallucinations.  Endo: Negative for unusual weight change.  Heme: Negative for bruising or bleeding. Allergy: Negative for rash or hives.      Physical Examination:   Vital signs in last 24 hours: Temp:  [97.5 F (36.4 C)-98.5 F (36.9 C)] 97.5 F (36.4 C) (07/18 0825) Pulse Rate:  [83-99] 84 (07/18 0055) Resp:  [12-19] 18 (07/18 0055) BP: (93-131)/(64-97) 131/77 (07/18 0055) SpO2:  [96 %-100 %] 100 % (07/18 0055) Weight:  [54.1 kg-54.4 kg] 54.1 kg (07/18 0055) Last BM Date : 02/22/23  General: Well-nourished, well-developed in no acute distress.  Head: Normocephalic, atraumatic.   Eyes: Conjunctiva pink, no icterus. Mouth: Oropharyngeal mucosa moist and pink  Neck: Supple without thyromegaly, masses, or lymphadenopathy.  Lungs: Clear to auscultation bilaterally.  Heart: Regular rate and rhythm, no murmurs rubs or gallops.  Abdomen: Bowel sounds are normal, nontender, nondistended, no hepatosplenomegaly or masses, no abdominal bruits or hernia , no rebound or guarding.   Rectal: not performed Extremities: No lower extremity edema, clubbing, deformity.  Neuro: Alert and oriented x 4 , grossly normal neurologically.  Skin: Warm and dry, no rash or jaundice.   Psych: Alert and cooperative, normal mood and affect.        Intake/Output from previous day: 07/17 0701 - 07/18 0700 In: 1010.3 [I.V.:214.6; Blood:695.7; IV Piggyback:100] Out: 350 [Urine:350] Intake/Output this shift: No intake/output data recorded.  Lab Results:   CBC Recent Labs    02/23/23 1634 02/24/23 0448  WBC 9.9 12.0*  HGB 6.0* 7.7*  HCT 18.4* 23.5*  MCV 100.5* 97.5  PLT 175 138*   BMET Recent Labs    02/23/23 1634 02/24/23 0448  NA 132* 136  K 4.3 4.1  CL 107 110  CO2 21* 21*  GLUCOSE 114* 95  BUN 33* 29*  CREATININE 0.65 0.67  CALCIUM 7.8* 8.0*   LFT Recent Labs    02/23/23 1634  BILITOT 0.4  ALKPHOS 53  AST 12*  ALT 9  PROT 4.9*  ALBUMIN 2.6*     Lipase Recent Labs    02/23/23 1634  LIPASE 24    PT/INR Recent Labs    02/23/23 1734  LABPROT 15.7*  INR 1.2     Hepatitis Panel No results for input(s): "HEPBSAG", "HCVAB", "HEPAIGM", "HEPBIGM" in the last 72 hours.   Imaging Studies:   No results found.Pierre.Alas week]  Assessment:   Very pleasant 67 year old male with history of neurofibromatosis, hypertension, advanced adenomatous colon polyps presenting with profound weakness, orthostasis, several days of nausea/vomiting with probable hematemesis.  GI bleed: Suspected upper GI bleed.  Profound anemia on presentation with hemoglobin of 6.0, hemoglobin have been normal at 13.7 in February 2023 prior to his hip replacement surgery.  Had typical postop anemia with hemoglobin of 11.1 at that time.  He takes aspirin daily.  Describes limited alcohol use, but daily.  Initially platelet count normal with mild thrombocytopenia today.  Albumin low.  No liver imaging available.  Differential diagnosis quite broad including upper GI bleed from Mallory-Weiss tear, peptic ulcer disease, Dieulafoy lesion, malignancy, variceal bleed remains in the differential as well.  He received 2 units of packed red blood cells with hemoglobin increasing from 6-7.7.  No reported hematemesis or melena since admission.  Plan:   Agree with PPI infusion. EGD today.  I have discussed the risks, alternatives, benefits with regards to but not limited to the risk of reaction to medication, bleeding, infection, perforation and the patient is agreeable to proceed. Written consent to be obtained.    LOS: 1 day   We would like to thank you for the opportunity to participate in the care of Kevin Dalton.  Leanna Battles. [facility] [phone] 7/18/20248:57 AM

## 2023-02-24 NOTE — Anesthesia Preprocedure Evaluation (Signed)
Anesthesia Evaluation  Patient identified by MRN, date of birth, ID band Patient awake    Reviewed: Allergy & Precautions, H&P , NPO status , Patient's Chart, lab work & pertinent test results  Airway Mallampati: II  TM Distance: >3 FB Neck ROM: Full    Dental  (+) Dental Advisory Given, Missing   Pulmonary Current Smoker and Patient abstained from smoking.   Pulmonary exam normal breath sounds clear to auscultation       Cardiovascular hypertension, Pt. on medications Normal cardiovascular exam Rhythm:Regular Rate:Normal     Neuro/Psych negative neurological ROS  negative psych ROS   GI/Hepatic negative GI ROS, Neg liver ROS,,,  Endo/Other  negative endocrine ROS    Renal/GU negative Renal ROS  negative genitourinary   Musculoskeletal  (+) Arthritis , Osteoarthritis,    Abdominal   Peds negative pediatric ROS (+)  Hematology  (+) Blood dyscrasia, anemia   Anesthesia Other Findings Neurofibromatosis   Reproductive/Obstetrics negative OB ROS                             Anesthesia Physical Anesthesia Plan  ASA: 3  Anesthesia Plan: General   Post-op Pain Management: Minimal or no pain anticipated   Induction: Intravenous  PONV Risk Score and Plan: 1 and Propofol infusion  Airway Management Planned: Nasal Cannula and Natural Airway  Additional Equipment:   Intra-op Plan:   Post-operative Plan:   Informed Consent: I have reviewed the patients History and Physical, chart, labs and discussed the procedure including the risks, benefits and alternatives for the proposed anesthesia with the patient or authorized representative who has indicated his/her understanding and acceptance.    Discussed DNR with patient and Suspend DNR.   Dental advisory given  Plan Discussed with: CRNA and Surgeon  Anesthesia Plan Comments:         Anesthesia Quick Evaluation

## 2023-02-24 NOTE — Anesthesia Procedure Notes (Signed)
Date/Time: 02/24/2023 4:30 PM  Performed by: Franco Nones, CRNAPre-anesthesia Checklist: Patient identified, Emergency Drugs available, Suction available, Timeout performed and Patient being monitored Patient Re-evaluated:Patient Re-evaluated prior to induction Oxygen Delivery Method: Nasal Cannula

## 2023-02-24 NOTE — Progress Notes (Signed)
   02/24/23 1353  TOC Brief Assessment  Insurance and Status Reviewed  Patient has primary care physician Yes  Home environment has been reviewed From home  Prior level of function: Independent  Prior/Current Home Services No current home services  Social Determinants of Health Reivew SDOH reviewed no interventions necessary  Readmission risk has been reviewed Yes [facility])  Transition of care needs no transition of care needs at this time      Transition of Care Department [facility]) has reviewed patient and no TOC needs have been identified at this time. We will continue to monitor patient advancement through interdisciplinary progression rounds. If new patient transition needs arise, please place a TOC consult.

## 2023-02-24 NOTE — H&P (Signed)
We will proceed with EGD as scheduled.    I thoroughly discussed with the patient the procedure, including the risks involved. Patient understands what the procedure involves including the benefits and any risks. Patient understands alternatives to the proposed procedure. Risks including (but not limited to) bleeding, tearing of the lining (perforation), rupture of adjacent organs, problems with heart and lung function, infection, and medication reactions. A small percentage of complications may require surgery, hospitalization, repeat endoscopic procedure, and/or transfusion.  Patient understood and agreed.

## 2023-02-24 NOTE — Care Management Important Message (Signed)
Important Message  Patient Details  Name: Kevin Dalton MRN: [id number] Date of Birth: 08/02/1956   Medicare Important Message Given:  N/A - LOS <3 / Initial given by admissions     Corey Harold 02/24/2023, 10:37 AM

## 2023-02-25 ENCOUNTER — Telehealth: Payer: Self-pay | Admitting: Gastroenterology

## 2023-02-25 DIAGNOSIS — K254 Chronic or unspecified gastric ulcer with hemorrhage: Secondary | ICD-10-CM | POA: Diagnosis not present

## 2023-02-25 DIAGNOSIS — K921 Melena: Secondary | ICD-10-CM | POA: Diagnosis not present

## 2023-02-25 DIAGNOSIS — K253 Acute gastric ulcer without hemorrhage or perforation: Secondary | ICD-10-CM | POA: Diagnosis not present

## 2023-02-25 LAB — CBC
HCT: 24.8 % — ABNORMAL LOW (ref 39.0–52.0)
Hemoglobin: 8 g/dL — ABNORMAL LOW (ref 13.0–17.0)
MCH: 32.7 pg (ref 26.0–34.0)
MCHC: 32.3 g/dL (ref 30.0–36.0)
MCV: 101.2 fL — ABNORMAL HIGH (ref 80.0–100.0)
Platelets: 156 10*3/uL (ref 150–400)
RBC: 2.45 MIL/uL — ABNORMAL LOW (ref 4.22–5.81)
RDW: 15.8 % — ABNORMAL HIGH (ref 11.5–15.5)
WBC: 14.1 10*3/uL — ABNORMAL HIGH (ref 4.0–10.5)
nRBC: 0 % (ref 0.0–0.2)

## 2023-02-25 MED ORDER — PANTOPRAZOLE SODIUM 40 MG PO TBEC: 40.0000 mg | DELAYED_RELEASE_TABLET | Freq: Every day | ORAL | 1 refills | Status: DC

## 2023-02-25 NOTE — Progress Notes (Signed)
   Gastroenterology Progress Note   Referring Provider: No ref. provider found Primary Care Physician:  Toma Deiters, MD Primary Gastroenterologist:  Dr. Marletta Lor  Patient ID: Kevin Dalton; [id number]; 10/01/1955    Subjective   Patient denies any abdominal pain. Tolerating diet. No blood. + flatus.    Objective   Vital signs in last 24 hours Temp:  [97.4 F (36.3 C)-98.9 F (37.2 C)] 98.6 F (37 C) (07/19 0513) Pulse Rate:  [67-91] 88 (07/19 0513) Resp:  [14-20] 18 (07/19 0513) BP: (79-119)/(64-84) 98/64 (07/19 0513) SpO2:  [93 %-100 %] 100 % (07/19 0513) Weight:  [54.1 kg] 54.1 kg (07/18 1415) Last BM Date : 02/22/23  Physical Exam General:   Alert and oriented, pleasant. Well nourished. NAD Head:  Normocephalic and atraumatic. Eyes:  No icterus, sclera clear. Conjuctiva pink.  Mouth:  Without lesions, mucosa pink and moist.  Neck:  Supple, without thyromegaly or masses.  Abdomen:  Bowel sounds present, soft, non-tender, non-distended. No HSM or hernias noted. No rebound or guarding. No masses appreciated  Neurologic:  Alert and  oriented x4;  grossly normal neurologically. Skin:  Warm and dry.   Psych:  Alert and cooperative. Normal mood and affect.  Intake/Output from previous day: 07/18 0701 - 07/19 0700 In: 740 [P.O.:240; I.V.:500] Out: 650 [Urine:650] Intake/Output this shift: Total I/O In: 240 [P.O.:240] Out: 250 [Urine:250]  Lab Results  Recent Labs    02/23/23 1634 02/24/23 0448 02/25/23 0923  WBC 9.9 12.0* 14.1*  HGB 6.0* 7.7* 8.0*  HCT 18.4* 23.5* 24.8*  PLT 175 138* 156   BMET Recent Labs    02/23/23 1634 02/24/23 0448  NA 132* 136  K 4.3 4.1  CL 107 110  CO2 21* 21*  GLUCOSE 114* 95  BUN 33* 29*  CREATININE 0.65 0.67  CALCIUM 7.8* 8.0*   LFT Recent Labs    02/23/23 1634  PROT 4.9*  ALBUMIN 2.6*  AST 12*  ALT 9  ALKPHOS 53  BILITOT 0.4   PT/INR Recent Labs    02/23/23 1734  LABPROT 15.7*  INR 1.2   Hepatitis  Panel No results for input(s): "HEPBSAG", "HCVAB", "HEPAIGM", "HEPBIGM" in the last 72 hours.   Studies/Results No results found.  Assessment  67 y.o. male with a history of neurofibromatosis, HTN, and adenomatous colon polyps who presented to [facility] for weakness, N/V, possible hematemesis and orthostasis. GI consulted for further evaluation of anemia.   Anemia, upper GI bleed: Hgb 6 on admission, previously 13.7 in February of last year. Had mild post op anemia after hip surgery. Does have history of low quantity daily alcohol use. Underwent EGD yesterday 7/18 with non bleeding gastric ulcer, erosive gastritis, normal duodenum, and deformity in the gastric antrum. No reports of melena. Tolerating regular diet. Will need to avoid NSAIDs and limit alcohol use going forward. Attending will follow up pathology.   Plan / Recommendations   Avoid NSAIDs Limit alcohol use PPI BID for 8 weeks Follow up pathology Okay to dc home from GI standpoint today CBC in 1 week OV in 3-4 weeks.  Repeat EGD in about 8 weeks.     LOS: 2 days    02/25/2023, 1:01 PM   Brooke Bonito, MSN, FNP-BC, AGACNP-BC [facility]

## 2023-02-25 NOTE — Discharge Summary (Signed)
Physician Discharge Summary   Patient: Kevin Dalton: [id number] DOB: [birth year]  Admit date:     02/23/2023  Discharge date: 02/25/23  Discharge Physician: Kendell Bane   PCP: Toma Deiters, MD   Recommendations at discharge:    F/up with GI in 2-4 weeks   Discharge Diagnoses: Principal Problem:   GI bleeding Active Problems:   Hypotension   Neurofibromatosis (HCC)   Hyponatremia   Peptic ulcer disease  Resolved Problems:   * No resolved hospital problems. *  Hospital Course: Kevin Dalton is a 67 y.o. male with medical history significant for hypertension and neurofibromatosis, presented to the emergency room with acute onset of generalized weakness and presyncope.   His birthday was on [birth year] when he had some friends throw a surprise party for him during which he ate pizza and ice cream.  He later that night felt nauseated and had several episodes of vomiting.  He stated that he normally does not eat that type of food and usually sticks to home cooking.  For the last few days he has been feeling fatigue and tired.  Today tried to stand up but felt he was going to pass out.   EMS initially had a BP of 98/70 with a 30 beat increase in his heart rate with standing.  He was given 1 L bolus of IV fluids before arriving to the ER with improvement of his blood pressure.  He has been taking amlodipine and lisinopril regularly the last couple of days despite his fatigue and presyncope.  He admitted to dark stools with mild abdominal discomfort.  No nausea or abdominal pain currently.  No dysuria, nocturia or urgency or frequency or flank pain..   ED Course:  PB 96/82 with otherwise normal vital signs.   LABs:  Hyponatremia 132 and CO2 21 with a glucose of 114 and BUN 33, calcium 7.8 anion gap 4 with albumin of 2.6 and total protein 4.9.    Troponin I was 3 and later the same.   CBC showed hemoglobin of 6 and hematocrit 18.4 compared to 11.1 and 31.9 on 10/04/2021 with MCV 100.5  and platelets of 175.  INR was 1.2 and PT 15.7.  Blood group was O+ with negative antibody screen.   UA was unremarkable.  Stool Hemoccult came back positive. EKG as reviewed by me : Twelve-lead EK EG showed sinus rhythm with a rate of 95 with RSR-in V1 and V2. Imaging: None  The patient was given 1 L bolus of IV lactated Ringer, IV Protonix 80 mg bolus followed by IV Protonix drip and was ordered 2 units of packed red blood cells.  He will be admitted to a stepdown unit bed for further evaluation and managemen    * GI bleeding Due to Non-bleeding gastric ulcer with  - Erosive gastritis.    -Cute blood loss anemia, status post 2U PRBC transfusion  - We will continue IV Protonix drip. - We will follow serial hemoglobins and hematocrits and posttransfusion H&H. - His aspirin will be held off. - GI consult will be obtained. - Dr. Jena Gauss was notified about the patient and is aware..cbc    Latest Ref Rng & Units 02/25/2023    9:23 AM 02/24/2023    4:48 AM 02/23/2023    4:34 PM  CBC  WBC 4.0 - 10.5 K/uL 14.1  12.0  9.9   Hemoglobin 13.0 - 17.0 g/dL 8.0  7.7  6.0   Hematocrit 39.0 - 52.0 %  24.8  23.5  18.4   Platelets 150 - 400 K/uL 156  138  175     02/22/2023 EGD with biopsies under propofol sedation.  Tolerated the procedure adequately.    FINDINGS: - Normal esophagus.  - Non-bleeding gastric ulcer with pigmented material [facility]).  Biopsied.  - Erosive gastritis.  Biopsied.  - Normal duodenal bulb and second portion of the duodenum.  - Deformed antrum and pylorus    RECOMMENDATIONS   - Await pathology results.  - Repeat upper endoscopy in 8 weeks to check healing as outpatient   -Avoid NSAIDs, patient was on aspirin 325mg  -If biopsies positive for H.Pylori treat likewise - PPI 40mg  , daily for 8 weeks    Hypotension - Continue Norvasc and benazepril Neurofibromatosis (HCC) - We will continue his pain management and muscle relaxants.  Hyponatremia - This is likely  hypovolemic. NA+ 132 >>>136 - S/p  Cont  IVF       Consultants: GI Procedures performed: EGD  Disposition: Home Diet recommendation:  Discharge Diet Orders (From admission, onward)     Start     Ordered   02/25/23 0000  Diet - low sodium heart healthy        02/25/23 1235           Regular diet DISCHARGE MEDICATION: Allergies as of 02/25/2023   No Known Allergies      Medication List     STOP taking these medications    aspirin EC 325 MG tablet   lisinopril 10 MG tablet Commonly known as: ZESTRIL   traMADol 50 MG tablet Commonly known as: ULTRAM       TAKE these medications    amLODipine 5 MG tablet Commonly known as: NORVASC Take 1 tablet (5 mg total) by mouth daily.   benazepril 10 MG tablet Commonly known as: LOTENSIN Take 10 mg by mouth daily.   ibandronate 150 MG tablet Commonly known as: BONIVA Take 150 mg by mouth every 30 (thirty) days.   methocarbamol 500 MG tablet Commonly known as: ROBAXIN Take 1 tablet (500 mg total) by mouth every 6 (six) hours as needed for muscle spasms.   NON FORMULARY Diet:Regular   oxyCODONE-acetaminophen 5-325 MG tablet Commonly known as: PERCOCET/ROXICET Take 1 tablet by mouth every 12 (twelve) hours as needed for severe pain.   pantoprazole 40 MG tablet Commonly known as: Protonix Take 1 tablet (40 mg total) by mouth daily.        Discharge Exam: Filed Weights   02/23/23 1520 02/24/23 0055 02/24/23 1415  Weight: 54.4 kg 54.1 kg 54.1 kg        General:  AAO x 3,  cooperative, no distress;   HEENT:  Normocephalic, PERRL, otherwise with in Normal limits   Neuro:  CNII-XII intact. , normal motor and sensation, reflexes intact   Lungs:   Clear to auscultation BL, Respirations unlabored,  No wheezes / crackles  Cardio:    S1/S2, RRR, No murmure, No Rubs or Gallops   Abdomen:  Soft, non-tender, bowel sounds active all four quadrants, no guarding or peritoneal signs.  Muscular  skeletal:   Limited exam -global generalized weaknesses - in bed, able to move all 4 extremities,   2+ pulses,  symmetric, No pitting edema  Skin:  Dry, warm to touch, negative for any Rashes,  Wounds: Please see nursing documentation          Condition at discharge: good  The results of significant diagnostics from this hospitalization (  including imaging, microbiology, ancillary and laboratory) are listed below for reference.   Imaging Studies: No results found.  Microbiology: Results for orders placed or performed during the hospital encounter of 02/23/23  MRSA Next Gen by PCR, Nasal     Status: None   Collection Time: 02/24/23 12:25 AM   Specimen: Nasal Mucosa; Nasal Swab  Result Value Ref Range Status   MRSA by PCR Next Gen NOT DETECTED NOT DETECTED Final    Comment: (NOTE) The GeneXpert MRSA Assay (FDA approved for NASAL specimens only), is one component of a comprehensive MRSA colonization surveillance program. It is not intended to diagnose MRSA infection nor to guide or monitor treatment for MRSA infections. Test performance is not FDA approved in patients less than 85 years old. Performed at [facility], [address] [place], [place] [address]     Labs: CBC: Recent Labs  Lab 02/23/23 1634 02/24/23 0448 02/25/23 0923  WBC 9.9 12.0* 14.1*  NEUTROABS 7.4  --   --   HGB 6.0* 7.7* 8.0*  HCT 18.4* 23.5* 24.8*  MCV 100.5* 97.5 101.2*  PLT 175 138* 156   Basic Metabolic Panel: Recent Labs  Lab 02/23/23 1634 02/24/23 0448  NA 132* 136  K 4.3 4.1  CL 107 110  CO2 21* 21*  GLUCOSE 114* 95  BUN 33* 29*  CREATININE 0.65 0.67  CALCIUM 7.8* 8.0*   Liver Function Tests: Recent Labs  Lab 02/23/23 1634  AST 12*  ALT 9  ALKPHOS 53  BILITOT 0.4  PROT 4.9*  ALBUMIN 2.6*   CBG: No results for input(s): "GLUCAP" in the last 168 hours.  Discharge time spent: greater than 40 minutes.  Signed: Kendell Bane, MD [facility] 02/25/2023

## 2023-02-25 NOTE — Care Management Important Message (Signed)
Important Message  Patient Details  Name: Kevin Dalton MRN: [id number] Date of Birth: [birth year]   Medicare Important Message Given:  N/A - LOS <3 / Initial given by admissions     Corey Harold 02/25/2023, 2:10 PM

## 2023-02-25 NOTE — Telephone Encounter (Signed)
Patient in need of CBC in 1 week. (Dx: anemia, gastric ulcer)  Patient needs hospital follow up in 3-4 weeks with [facility] or Dr. Marletta Lor, if no availability then any other APP.   Brooke Bonito, MSN, APRN, FNP-BC, AGACNP-BC [facility]

## 2023-02-25 NOTE — Progress Notes (Signed)
Nsg Discharge Note  Admit Date:  02/23/2023 Discharge date: 02/25/2023   Kevin Dalton to be D/C'd Home per MD order.  AVS completed.  Copy for chart, and copy for patient signed, and dated. Patient/caregiver able to verbalize understanding.  Discharge Medication: Allergies as of 02/25/2023   No Known Allergies      Medication List     STOP taking these medications    aspirin EC 325 MG tablet   lisinopril 10 MG tablet Commonly known as: ZESTRIL   traMADol 50 MG tablet Commonly known as: ULTRAM       TAKE these medications    amLODipine 5 MG tablet Commonly known as: NORVASC Take 1 tablet (5 mg total) by mouth daily.   benazepril 10 MG tablet Commonly known as: LOTENSIN Take 10 mg by mouth daily.   ibandronate 150 MG tablet Commonly known as: BONIVA Take 150 mg by mouth every 30 (thirty) days.   methocarbamol 500 MG tablet Commonly known as: ROBAXIN Take 1 tablet (500 mg total) by mouth every 6 (six) hours as needed for muscle spasms.   NON FORMULARY Diet:Regular   oxyCODONE-acetaminophen 5-325 MG tablet Commonly known as: PERCOCET/ROXICET Take 1 tablet by mouth every 12 (twelve) hours as needed for severe pain.   pantoprazole 40 MG tablet Commonly known as: Protonix Take 1 tablet (40 mg total) by mouth daily.        Discharge Assessment: Vitals:   02/25/23 0137 02/25/23 0513  BP: 102/65 98/64  Pulse: 90 88  Resp: 17 18  Temp: 98.9 F (37.2 C) 98.6 F (37 C)  SpO2: 100% 100%   Skin clean, dry and intact without evidence of skin break down, no evidence of skin tears noted. IV catheter discontinued intact. Site without signs and symptoms of complications - no redness or edema noted at insertion site, patient denies c/o pain - only slight tenderness at site.  Dressing with slight pressure applied.  D/c Instructions-Education: Discharge instructions given to patient/family with verbalized understanding. D/c education completed with patient/family  including follow up instructions, medication list, d/c activities limitations if indicated, with other d/c instructions as indicated by MD - patient able to verbalize understanding, all questions fully answered. Patient instructed to return to ED, call 911, or call MD for any changes in condition.  Patient escorted via WC, and D/C home via private auto.  Demetrio Lapping, LPN 0/[id number]/1191 4:78 PM

## 2023-02-28 ENCOUNTER — Other Ambulatory Visit: Payer: Self-pay | Admitting: *Deleted

## 2023-02-28 DIAGNOSIS — K253 Acute gastric ulcer without hemorrhage or perforation: Secondary | ICD-10-CM

## 2023-02-28 DIAGNOSIS — D649 Anemia, unspecified: Secondary | ICD-10-CM

## 2023-03-01 ENCOUNTER — Encounter (HOSPITAL_COMMUNITY): Payer: Self-pay | Admitting: Gastroenterology

## 2023-03-01 ENCOUNTER — Other Ambulatory Visit (HOSPITAL_COMMUNITY): Payer: Self-pay | Admitting: Gastroenterology

## 2023-03-01 ENCOUNTER — Encounter (INDEPENDENT_AMBULATORY_CARE_PROVIDER_SITE_OTHER): Payer: Self-pay | Admitting: *Deleted

## 2023-03-01 LAB — SURGICAL PATHOLOGY

## 2023-03-01 MED ORDER — CLARITHROMYCIN 500 MG PO TABS: 500.0000 mg | ORAL_TABLET | Freq: Two times a day (BID) | ORAL | 0 refills | Status: DC

## 2023-03-01 MED ORDER — AMOXICILLIN 500 MG PO TABS: 1000.0000 mg | ORAL_TABLET | Freq: Two times a day (BID) | ORAL | 0 refills | Status: DC

## 2023-03-01 MED ORDER — PANTOPRAZOLE SODIUM 40 MG PO TBEC: 40.0000 mg | DELAYED_RELEASE_TABLET | Freq: Two times a day (BID) | ORAL | 0 refills | Status: DC

## 2023-03-07 ENCOUNTER — Other Ambulatory Visit (INDEPENDENT_AMBULATORY_CARE_PROVIDER_SITE_OTHER): Payer: Self-pay | Admitting: *Deleted

## 2023-03-07 ENCOUNTER — Telehealth: Payer: Self-pay | Admitting: *Deleted

## 2023-03-07 ENCOUNTER — Telehealth (INDEPENDENT_AMBULATORY_CARE_PROVIDER_SITE_OTHER): Payer: Self-pay | Admitting: *Deleted

## 2023-03-07 DIAGNOSIS — Z681 Body mass index (BMI) 19 or less, adult: Secondary | ICD-10-CM | POA: Diagnosis not present

## 2023-03-07 DIAGNOSIS — I10 Essential (primary) hypertension: Secondary | ICD-10-CM | POA: Diagnosis not present

## 2023-03-07 DIAGNOSIS — K253 Acute gastric ulcer without hemorrhage or perforation: Secondary | ICD-10-CM | POA: Diagnosis not present

## 2023-03-07 MED ORDER — PANTOPRAZOLE SODIUM 40 MG PO TBEC: 40.0000 mg | DELAYED_RELEASE_TABLET | Freq: Two times a day (BID) | ORAL | 0 refills | Status: DC

## 2023-03-07 MED ORDER — CLARITHROMYCIN 500 MG PO TABS: 500.0000 mg | ORAL_TABLET | Freq: Two times a day (BID) | ORAL | 0 refills | Status: AC

## 2023-03-07 MED ORDER — AMOXICILLIN 500 MG PO TABS: 1000.0000 mg | ORAL_TABLET | Freq: Two times a day (BID) | ORAL | 0 refills | Status: AC

## 2023-03-07 NOTE — Telephone Encounter (Signed)
Patient returned your call.

## 2023-03-07 NOTE — Telephone Encounter (Signed)
Pt called and wanted the 3 prescriptions sent in to [facility] changed to [facility]. I called [facility] and canceled rx and [facility]. Pt notified.

## 2023-03-08 ENCOUNTER — Telehealth (INDEPENDENT_AMBULATORY_CARE_PROVIDER_SITE_OTHER): Payer: Self-pay | Admitting: Gastroenterology

## 2023-03-08 NOTE — Telephone Encounter (Signed)
Pt walked in office today and I called [facility] pharmacy since they only gave him one rx instead of 3 that were sent in. Pharmacy said too early to get pantoprazole refilled due to insurance. Pt had some at home he usually takes one per day and I adivised him to take one bid for 14 days while on antibiotics and pharmacy said the other anitibiotic he needs will be ready for pick up today after 12:30. Pt is aware to start all meds at the same time.

## 2023-03-08 NOTE — Telephone Encounter (Signed)
Talked with pt. See pt call from today

## 2023-03-08 NOTE — Telephone Encounter (Signed)
Patient left voice mail message stating he was to get two prescriptions and only got one.  Please advise - ph# [phone]

## 2023-03-11 ENCOUNTER — Telehealth (INDEPENDENT_AMBULATORY_CARE_PROVIDER_SITE_OTHER): Payer: Self-pay | Admitting: *Deleted

## 2023-03-11 NOTE — Telephone Encounter (Signed)
Called pharmacy back and they did override so pt could pick up rx today and pt was notified rx would be ready to pick up today

## 2023-03-11 NOTE — Telephone Encounter (Signed)
Pt states he could not get script for pantoprazole that was sent in for bid for 14 days. He currently takes one per day. [facility] and was told he picked up 30 day supply on 7/19 so insurance will not fill new script toil 8/15. He has 11 tablets left. He will run out in 5 days since he is doubling up on med. He will need rx on 8/7.

## 2023-03-11 NOTE — Telephone Encounter (Signed)
Pt left vm to call him back about a refill he needed. He did not leave name of med. I called him back on the number he left [phone] and left a message for him to call me back.

## 2023-03-21 ENCOUNTER — Telehealth (INDEPENDENT_AMBULATORY_CARE_PROVIDER_SITE_OTHER): Payer: Self-pay | Admitting: *Deleted

## 2023-03-21 NOTE — Telephone Encounter (Signed)
Pt called and states he will finish treatment for h pylori today. He wanted to know how to tell if bacteria is cleared up. He states no symptoms but states he wasn't having any symptoms before treatment either. I let him know he had a follow up appt on 8/27 with [facility] where they usually do a breath test but it would be up to provider if he needed anything. Does he need to stop ppi after he finishes treatment today to do breath test in 2 weeks or should he continue til visit and then discuss.

## 2023-03-21 NOTE — Telephone Encounter (Signed)
Hi Toniann Fail   He had a gastric ulcer . So he needs to continue PPI for atleast for total of 8 weeks uninterpreted. Next appointment he will be scheduled for repeat upper endoscopy where we will assess healing of the ulcer and will again take biopsies to evaluate for H.Pylori , if its been eradicated

## 2023-03-21 NOTE — Telephone Encounter (Signed)
Discussed with patient per Dr. Tasia Catchings - He had a gastric ulcer . So he needs to continue PPI for atleast for total of 8 weeks uninterpreted. Next appointment he will be scheduled for repeat upper endoscopy where we will assess healing of the ulcer and will again take biopsies to evaluate for H.Pylori , if its been eradicated  Patient verbalized understanding.

## 2023-04-05 ENCOUNTER — Ambulatory Visit (INDEPENDENT_AMBULATORY_CARE_PROVIDER_SITE_OTHER): Payer: Medicare HMO | Admitting: Gastroenterology

## 2023-04-05 ENCOUNTER — Encounter (INDEPENDENT_AMBULATORY_CARE_PROVIDER_SITE_OTHER): Payer: Self-pay | Admitting: Gastroenterology

## 2023-04-05 VITALS — BP 116/80 | HR 101 | Temp 98.0°F | Ht 72.0 in | Wt 129.6 lb

## 2023-04-05 DIAGNOSIS — B9681 Helicobacter pylori [H. pylori] as the cause of diseases classified elsewhere: Secondary | ICD-10-CM

## 2023-04-05 DIAGNOSIS — Z86010 Personal history of colonic polyps: Secondary | ICD-10-CM | POA: Diagnosis not present

## 2023-04-05 DIAGNOSIS — K279 Peptic ulcer, site unspecified, unspecified as acute or chronic, without hemorrhage or perforation: Secondary | ICD-10-CM

## 2023-04-05 DIAGNOSIS — D500 Iron deficiency anemia secondary to blood loss (chronic): Secondary | ICD-10-CM | POA: Diagnosis not present

## 2023-04-05 MED ORDER — PANTOPRAZOLE SODIUM 40 MG PO TBEC
40.0000 mg | DELAYED_RELEASE_TABLET | Freq: Every day | ORAL | 1 refills | Status: DC
Start: 2023-04-05 — End: 2023-05-24

## 2023-04-05 NOTE — Patient Instructions (Addendum)
It was very nice to meet you today, as dicussed with will plan for the following :  1) Repeat  Upper endoscopy in 1 month   2) Avoid Goody powder and all NSAIDs  3) continue Protonix once daily for another 1 month

## 2023-04-05 NOTE — Progress Notes (Signed)
Kevin Dalton , M.D. Gastroenterology & Hepatology [facility] Gastroenterology [address] [place], [place] [address] Primary Care Physician: Toma Deiters, MD [address] [place] [address]  Chief Complaint:  Peptic ulcer disease, Anemia from chronic blood loss , history of advance TA's  History of Present Illness: Kevin Dalton is a 67 y.o. male with PMH of neurofibromatosis, HTN, adenomatous colon polyps is here for evaluation of peptic ulcer disease,Anemia from chronic blood loss and history of advance Ta's.  Patient was last seen in the hospital on 02/24/2019 for.  Presented with vomiting lightheadedness and generalized with hemoglobin of 6 requiring transfusion with a baseline of 11 .  Patient underwent upper endoscopy found to have a cratered gastric ulcer biopsies positive for H. pylori.  At that time patient was also taking daily Goodypowder which has 325 of aspirin  Today patient reports he is feeling better than before feeling stronger and denies any blood in the stool or melena.  He denies taking any further NSAIDs or Goody powder.  Patient reports that he did completed 14 days of clarithromycin based triple therapy and currently taking Protonix as directed.The patient denies having any nausea, vomiting, fever, chills, hematochezia, melena, hematemesis, abdominal distention, abdominal pain, diarrhea, jaundice, pruritus or weight loss.  Last EGD:02/2023  - Non- bleeding gastric ulcer with pigmented material [facility]) . Biopsied. - Erosive gastritis. Biopsied. - Normal duodenal bulb and second portion of the duodenum. - Acquired deformity in the gastric antrum - Deformed antrum and pylorus .  -  Antral/small intestinal type mucosa with moderate chronic  inflammation and focal mild activity, suggestive of Helicobacter pylori  infection    Last Colonoscopy:  Colonoscopy 09/2021: -nonbleeding internal hemorrhoids -three 3-69mm  polyps in the descending colon, tubular adenoma -3 year surveillance colonoscopy   Colonoscopy 01/2021: -nonbleeding internal hemorrhoids -one 10mm polyp in rectum -one 12mm polyp in the descending colon -one 22mm polyp in descending colon -tubulovillous adenoma and tubular adenoma    FHx: neg for any gastrointestinal/liver disease, no malignancies Social: neg smoking, alcohol or illicit drug use Surgical: no abdominal surgeries  Past Medical History: Past Medical History:  Diagnosis Date   Hypertension    Neurofibromatosis     Past Surgical History: Past Surgical History:  Procedure Laterality Date   BIOPSY  02/24/2023   Procedure: BIOPSY;  Surgeon: Franky Macho, MD;  [facility];  Service: Endoscopy;;   COLONOSCOPY WITH PROPOFOL N/A 01/23/2021   Procedure: COLONOSCOPY WITH PROPOFOL;  Surgeon: Lanelle Bal, DO;  [facility];  Service: Endoscopy;  Laterality: N/A;  ASA I/II / 12:00   COLONOSCOPY WITH PROPOFOL N/A 09/22/2021   Procedure: COLONOSCOPY WITH PROPOFOL;  Surgeon: Lanelle Bal, DO;  [facility];  Service: Endoscopy;  Laterality: N/A;  10:30 / ASA 2   ESOPHAGOGASTRODUODENOSCOPY (EGD) WITH PROPOFOL N/A 02/24/2023   Procedure: ESOPHAGOGASTRODUODENOSCOPY (EGD) WITH PROPOFOL;  Surgeon: Franky Macho, MD;  [facility];  Service: Endoscopy;  Laterality: N/A;   POLYPECTOMY  01/23/2021   Procedure: POLYPECTOMY;  Surgeon: Lanelle Bal, DO;  [facility];  Service: Endoscopy;;   POLYPECTOMY  09/22/2021   Procedure: POLYPECTOMY;  Surgeon: Lanelle Bal, DO;  [facility];  Service: Endoscopy;;   TOTAL HIP ARTHROPLASTY Right 10/02/2021   Procedure: RIGHT TOTAL HIP ARTHROPLASTY ANTERIOR APPROACH;  Surgeon: Eldred Manges, MD;  Location: [facility];  Service: Orthopedics;  Laterality: Right;    Family History: Family History  Problem Relation Age of Onset   Diabetes Sister    Colon cancer  Brother    Leukemia Brother     Social History: Social History   Tobacco Use  Smoking Status Every Day   Types: Cigarettes   Passive exposure: Current  Smokeless Tobacco Never  Tobacco Comments   Smokes 1-3 cigarettes daily   Social History   Substance and Sexual Activity  Alcohol Use Yes   Comment: 16 ounces of beer most nights, denies history of heavy use   Social History   Substance and Sexual Activity  Drug Use No    Allergies: No Known Allergies  Medications: Current Outpatient Medications  Medication Sig Dispense Refill   amLODipine (NORVASC) 5 MG tablet Take 1 tablet (5 mg total) by mouth daily. 30 tablet 0   benazepril (LOTENSIN) 10 MG tablet Take 10 mg by mouth daily.     ibandronate (BONIVA) 150 MG tablet Take 150 mg by mouth every 30 (thirty) days.     methocarbamol (ROBAXIN) 500 MG tablet Take 1 tablet (500 mg total) by mouth every 6 (six) hours as needed for muscle spasms. 50 tablet 0   NON FORMULARY Diet:Regular     pantoprazole (PROTONIX) 40 MG tablet Take 1 tablet (40 mg total) by mouth daily. 30 tablet 1   No current facility-administered medications for this visit.    Review of Systems: GENERAL: negative for malaise, night sweats HEENT: No changes in hearing or vision, no nose bleeds or other nasal problems. NECK: Negative for lumps, goiter, pain and significant neck swelling RESPIRATORY: Negative for cough, wheezing CARDIOVASCULAR: Negative for chest pain, leg swelling, palpitations, orthopnea GI: SEE HPI MUSCULOSKELETAL: Negative for joint pain or swelling, back pain, and muscle pain. SKIN: Negative for lesions, rash HEMATOLOGY Negative for prolonged bleeding, bruising easily, and swollen nodes. ENDOCRINE: Negative for cold or heat intolerance, polyuria, polydipsia and goiter. NEURO: negative for tremor, gait imbalance, syncope and seizures. The remainder of the review of systems is noncontributory.   Physical Exam: BP 116/80 (BP  Location: Left Arm, Patient Position: Sitting, Cuff Size: Normal)   Pulse (!) 101   Temp 98 F (36.7 C) (Oral)   Ht 6' (1.829 m)   Wt 129 lb 9.6 oz (58.8 kg)   BMI 17.58 kg/m  GENERAL: The patient is AO x3, in no acute distress. HEENT: Head is normocephalic and atraumatic. EOMI are intact. Mouth is well hydrated and without lesions. NECK: Supple. No masses LUNGS: Clear to auscultation. No presence of rhonchi/wheezing/rales. Adequate chest expansion HEART: RRR, normal s1 and s2. ABDOMEN: Soft, nontender, no guarding, no peritoneal signs, and nondistended. BS +. No masses. EXTREMITIES: Without any cyanosis, clubbing, rash, lesions or edema. NEUROLOGIC: AOx3, no focal motor deficit. SKIN: generalzied neuromas all over the body    Imaging/Labs: as above  I personally reviewed and interpreted the available labs, imaging and endoscopic files.  Impression and Plan:  Kevin Dalton is a 67 y.o. male with PMH of neurofibromatosis, HTN, adenomatous colon polyps is here for evaluation of peptic ulcer disease,Anemia from chronic blood loss and history of advance Ta's.  # Peptic ulcer diease  Patient is known to have large gastric ulcer with antral deformity.  This is likely due to underlying H. pylori infection and patient taking large amount of NSAID ( was also taking daily Goodypowder which has 325 of aspirin)  Patient currently denies taking any further NSAID and underwent an erythromycin  based triple therapy for H. pylori infection  Would recommend repeat upper endoscopy to evaluate for healing of the gastric ulcer given his most large and cratered and to ensure there is no underlying malignancy  Will plan to have repeat upper endoscopy in 1 Continue Protonix 40 mg daily for another month  #H. Pylori infection  Upper endoscopy with biopsies positive for H. pylori patient completed 14-day course of clarithromycin based triple therapy  Will check for eradication with stool antigen  after holding PPI for 14 days can be done after 1 month  #Macrocytic anemia  Patient a month ago presented to the ER with hemoglobin of 6 requiring transfusion with a baseline of 11 .   Will check vitamin B12 and folate Also will check ferritin levels.    If patient is found to be iron deficient would benefit from small bowel capsule endoscopy to evaluate for lesion such as GIST as patient with neurofibromatosis of higher incidence than general population.  #Advance adenoma  Patient advanced adenoma (more than 10 mm in size).  Is up-to-date to colonoscopy on 09/2021 next colonoscopy in 3 years ( 2026)   All questions were answered.      Kevin Lawman, MD Gastroenterology and Hepatology [facility] Gastroenterology   This chart has been completed using [facility] Dictation software, and while attempts have been made to ensure accuracy , certain words and phrases may not be transcribed as intended

## 2023-04-06 DIAGNOSIS — K279 Peptic ulcer, site unspecified, unspecified as acute or chronic, without hemorrhage or perforation: Secondary | ICD-10-CM | POA: Diagnosis not present

## 2023-04-06 DIAGNOSIS — D500 Iron deficiency anemia secondary to blood loss (chronic): Secondary | ICD-10-CM | POA: Diagnosis not present

## 2023-04-07 LAB — CBC
Hematocrit: 27.5 % — ABNORMAL LOW (ref 37.5–51.0)
Hemoglobin: 8.1 g/dL — ABNORMAL LOW (ref 13.0–17.7)
MCH: 24.1 pg — ABNORMAL LOW (ref 26.6–33.0)
MCHC: 29.5 g/dL — ABNORMAL LOW (ref 31.5–35.7)
MCV: 82 fL (ref 79–97)
Platelets: 560 10*3/uL — ABNORMAL HIGH (ref 150–450)
RBC: 3.36 x10E6/uL — ABNORMAL LOW (ref 4.14–5.80)
RDW: 21.3 % — ABNORMAL HIGH (ref 11.6–15.4)
WBC: 9.6 10*3/uL (ref 3.4–10.8)

## 2023-04-07 LAB — FERRITIN: Ferritin: 7 ng/mL — ABNORMAL LOW (ref 30–400)

## 2023-04-08 LAB — H. PYLORI ANTIGEN, STOOL: H pylori Ag, Stl: NEGATIVE

## 2023-04-12 NOTE — Progress Notes (Signed)
Hi Kevin Dalton ,  Can you please call the patient and tell the patient the lab work shows that he is still deficient in iron (ferritin 7) but have been cured from H. pylori bacteria in the stomach.  Recommend continue taking iron supplement and referral to hematology for possible iron infusion  He should continue to follow with [place] in the GI clinic and if iron levels do not improve in future, would benefit from small bowel capsule endoscopy  Thanks,  Vista Lawman, MD Gastroenterology and Hepatology [facility] Gastroenterology

## 2023-04-13 ENCOUNTER — Telehealth (INDEPENDENT_AMBULATORY_CARE_PROVIDER_SITE_OTHER): Payer: Self-pay | Admitting: *Deleted

## 2023-04-13 NOTE — Telephone Encounter (Signed)
Discussed results with patient and he asked if needed repeat egd to check ulcer. And if so when does he need to have it.

## 2023-04-13 NOTE — Telephone Encounter (Signed)
Hi I recently saw patient in clinic and recommened repeat upper endoscopy to be scheduled after 1 month   Tanya; Can this patient be scheduled for EGD after 1 month ,  Room 3

## 2023-04-18 DIAGNOSIS — Q8501 Neurofibromatosis, type 1: Secondary | ICD-10-CM | POA: Diagnosis not present

## 2023-04-18 DIAGNOSIS — I96 Gangrene, not elsewhere classified: Secondary | ICD-10-CM | POA: Diagnosis not present

## 2023-04-18 DIAGNOSIS — Z681 Body mass index (BMI) 19 or less, adult: Secondary | ICD-10-CM | POA: Diagnosis not present

## 2023-04-21 ENCOUNTER — Telehealth (INDEPENDENT_AMBULATORY_CARE_PROVIDER_SITE_OTHER): Payer: Self-pay | Admitting: *Deleted

## 2023-04-21 ENCOUNTER — Inpatient Hospital Stay: Payer: Medicare HMO | Attending: Oncology | Admitting: Oncology

## 2023-04-21 ENCOUNTER — Other Ambulatory Visit: Payer: Self-pay | Admitting: Oncology

## 2023-04-21 ENCOUNTER — Encounter: Payer: Self-pay | Admitting: Oncology

## 2023-04-21 VITALS — BP 127/85 | HR 86 | Temp 98.1°F | Resp 18 | Ht 72.0 in | Wt 125.8 lb

## 2023-04-21 DIAGNOSIS — F17210 Nicotine dependence, cigarettes, uncomplicated: Secondary | ICD-10-CM | POA: Insufficient documentation

## 2023-04-21 DIAGNOSIS — D75838 Other thrombocytosis: Secondary | ICD-10-CM | POA: Insufficient documentation

## 2023-04-21 DIAGNOSIS — K922 Gastrointestinal hemorrhage, unspecified: Secondary | ICD-10-CM | POA: Diagnosis not present

## 2023-04-21 DIAGNOSIS — K254 Chronic or unspecified gastric ulcer with hemorrhage: Secondary | ICD-10-CM | POA: Diagnosis not present

## 2023-04-21 DIAGNOSIS — Z79899 Other long term (current) drug therapy: Secondary | ICD-10-CM | POA: Insufficient documentation

## 2023-04-21 DIAGNOSIS — D500 Iron deficiency anemia secondary to blood loss (chronic): Secondary | ICD-10-CM | POA: Diagnosis not present

## 2023-04-21 DIAGNOSIS — D509 Iron deficiency anemia, unspecified: Secondary | ICD-10-CM | POA: Insufficient documentation

## 2023-04-21 NOTE — Progress Notes (Signed)
[facility] HEMATOLOGY NEW VISIT  Toma Deiters, MD  REASON FOR REFERRAL: Iron deficiency anemia  SUMMARY OF HEMATOLOGIC HISTORY:    Latest Ref Rng & Units 04/06/2023    8:40 AM 02/25/2023    9:23 AM 02/24/2023    4:48 AM  CBC  WBC 3.4 - 10.8 x10E3/uL 9.6  14.1  12.0   Hemoglobin 13.0 - 17.7 g/dL 8.1  8.0  7.7   Hematocrit 37.5 - 51.0 % 27.5  24.8  23.5   Platelets 150 - 450 x10E3/uL 560  156  138    Lab Results  Component Value Date   FERRITIN 7 (L) 04/06/2023      HISTORY OF PRESENT ILLNESS: Kevin Dalton 67 y.o. male referred for iron deficiency anemia.  Patient has a past medical history of hypertension and neurofibromatosis.  He arrived unaccompanied to the clinic today.  Today.  He was recently admitted to the hospital on 02/23/2023 for acute GI bleed with a hemoglobin of 6 and required 2 units of packed red blood cells transfusion.  Upper GI endoscopy at this time showed nonbleeding gastric ulcers.  He had a colonoscopy done in 2023 which showed adenoma repeat was recommended in 3 years.  Patient stated that he had dark-colored stools prior to this admission and that has since resolved.  He denies dizziness, shortness of breath, abdominal pain, bright red blood per rectum, weight loss, loss of appetite.  He stated that he is taking oral iron every day has no troubles with it.  We discussed that his iron levels are critically low with ferritin of 7 and will need IV iron replacement.  Risk versus benefits discussed in detail and patient is agreeable to get IV iron.  Patient is a current smoker smoking 1 pack in 2 to 3 days and has smoked for 50 years.  Occasional alcohol use.  Reported his brother had colon cancer and no other family history of cancer.  I have reviewed the past medical history, past surgical history, social history and family history with the patient   ALLERGIES:  has No Known Allergies.  MEDICATIONS:  Current  Outpatient Medications  Medication Sig Dispense Refill   amLODipine (NORVASC) 5 MG tablet Take 1 tablet (5 mg total) by mouth daily. 30 tablet 0   benazepril (LOTENSIN) 10 MG tablet Take 10 mg by mouth daily.     ibandronate (BONIVA) 150 MG tablet Take 150 mg by mouth every 30 (thirty) days.     methocarbamol (ROBAXIN) 500 MG tablet Take 1 tablet (500 mg total) by mouth every 6 (six) hours as needed for muscle spasms. 50 tablet 0   NON FORMULARY Diet:Regular     pantoprazole (PROTONIX) 40 MG tablet Take 1 tablet (40 mg total) by mouth daily. 30 tablet 1   No current facility-administered medications for this visit.     REVIEW OF SYSTEMS:   Constitutional: Denies fevers, chills or night sweats Eyes: Denies blurriness of vision Ears, nose, mouth, throat, and face: Denies mucositis or sore throat Respiratory: Denies cough, dyspnea or wheezes Cardiovascular: Denies palpitation, chest discomfort or lower extremity swelling Gastrointestinal:  Denies nausea, heartburn or change in bowel habits Skin: Denies abnormal skin rashes Lymphatics: Denies new lymphadenopathy or easy bruising Neurological:Denies numbness, tingling or new weaknesses Behavioral/Psych: Mood is stable, no new changes  All other systems were reviewed with the patient and are negative.  PHYSICAL EXAMINATION:   Vitals:   04/21/23 0811  BP:  127/85  Pulse: 86  Resp: 18  Temp: 98.1 F (36.7 C)  SpO2: 100%    GENERAL:alert, no distress and comfortable SKIN: skin color, texture, turgor are normal, diffuse neurofibromatosis lesions all through the body EYES: normal, Conjunctiva are pink and non-injected, sclera clear LUNGS: clear to auscultation and percussion with normal breathing effort HEART: regular rate & rhythm and no murmurs and no lower extremity edema ABDOMEN:abdomen soft, non-tender and normal bowel sounds Musculoskeletal:no cyanosis of digits and no clubbing  NEURO: alert & oriented x 3 with fluent  speech  LABORATORY DATA:  I have reviewed the data as listed  Lab Results  Component Value Date   WBC 9.6 04/06/2023   NEUTROABS 7.4 02/23/2023   HGB 8.1 (L) 04/06/2023   HCT 27.5 (L) 04/06/2023   MCV 82 04/06/2023   PLT 560 (H) 04/06/2023      Component Value Date/Time   NA 136 02/24/2023 0448   K 4.1 02/24/2023 0448   CL 110 02/24/2023 0448   CO2 21 (L) 02/24/2023 0448   GLUCOSE 95 02/24/2023 0448   BUN 29 (H) 02/24/2023 0448   CREATININE 0.67 02/24/2023 0448   CALCIUM 8.0 (L) 02/24/2023 0448   PROT 4.9 (L) 02/23/2023 1634   ALBUMIN 2.6 (L) 02/23/2023 1634   AST 12 (L) 02/23/2023 1634   ALT 9 02/23/2023 1634   ALKPHOS 53 02/23/2023 1634   BILITOT 0.4 02/23/2023 1634   GFRNONAA >60 02/24/2023 0448   GFRAA >60 02/27/2020 1531       Chemistry      Component Value Date/Time   NA 136 02/24/2023 0448   K 4.1 02/24/2023 0448   CL 110 02/24/2023 0448   CO2 21 (L) 02/24/2023 0448   BUN 29 (H) 02/24/2023 0448   CREATININE 0.67 02/24/2023 0448      Component Value Date/Time   CALCIUM 8.0 (L) 02/24/2023 0448   ALKPHOS 53 02/23/2023 1634   AST 12 (L) 02/23/2023 1634   ALT 9 02/23/2023 1634   BILITOT 0.4 02/23/2023 1634     Lab Results  Component Value Date   FERRITIN 7 (L) 04/06/2023      ASSESSMENT & PLAN:  Patient is a 67 year old male with past medical history of hypertension and neurofibromatosis referred by his gastroenterologist for iron deficiency anemia   Iron deficiency anemia -The most likely cause of his anemia is due to chronic blood loss. -Endoscopy showed nonbleeding gastric ulcer, gastritis, H. pylori infection which is treated at this time. -We discussed some of the risks, benefits, and alternatives of intravenous iron infusions.  The patient has critically low iron with a ferritin of 7.  Patient desires to achieve higher levels of iron faster for adequate hematopoesis. Some of the side-effects to be expected including risks of infusion reactions,  phlebitis, headaches, nausea and fatigue.  The patient is willing to proceed. Patient education material was dispensed. Will schedule for next week -Patient is also on oral PPI which will affect his absorption of iron -Continue oral iron 325mg  every other day. Use OTC stool softeners for constipation -Goal is to keep ferritin level greater than 50 and resolution of anemia   RTC in 1 month with labs  Reactive thrombocytosis -Likely secondary to iron deficiency anemia -Will continue to monitor  GI bleeding -Endoscopy showed nonbleeding gastric ulcers -Being followed by GI -Colonoscopy due in 2026 -Recommended to to avoid NSAIDs   Orders Placed This Encounter  Procedures   CBC with Differential/Platelet    Standing Status:  Future    Standing Expiration Date:   04/20/2024   Ferritin    Standing Status:   Future    Standing Expiration Date:   04/20/2024   Folate    Standing Status:   Future    Standing Expiration Date:   04/20/2024   Vitamin B12    Standing Status:   Future    Standing Expiration Date:   04/20/2024   Iron and TIBC    Standing Status:   Future    Standing Expiration Date:   04/20/2024    The total time spent in the appointment was 40 minutes encounter with patients including review of chart and various tests results, discussions about plan of care and coordination of care plan   All questions were answered. The patient knows to call the clinic with any problems, questions or concerns. No barriers to learning was detected.   Cindie Crumbly, MD 9/12/20248:55 AM

## 2023-04-21 NOTE — Assessment & Plan Note (Addendum)
-  Endoscopy showed nonbleeding gastric ulcers -Being followed by GI -Colonoscopy due in 2026 -Recommended to to avoid NSAIDs

## 2023-04-21 NOTE — Telephone Encounter (Signed)
Discussed with patient per Dr. Tasia Catchings -  Iron probably causing the stool to turn into black colour . But Dr Anders Simmonds have ordered blood work for him and he should get it done to monitor his blood count

## 2023-04-21 NOTE — Assessment & Plan Note (Addendum)
-  The most likely cause of his anemia is due to chronic blood loss. -Endoscopy showed nonbleeding gastric ulcer, gastritis, H. pylori infection which is treated at this time. -We discussed some of the risks, benefits, and alternatives of intravenous iron infusions.  The patient has critically low iron with a ferritin of 7.  Patient desires to achieve higher levels of iron faster for adequate hematopoesis. Some of the side-effects to be expected including risks of infusion reactions, phlebitis, headaches, nausea and fatigue.  The patient is willing to proceed. Patient education material was dispensed. Will schedule for next week -Patient is also on oral PPI which will affect his absorption of iron -Continue oral iron 325mg  every other day. Use OTC stool softeners for constipation -Goal is to keep ferritin level greater than 50 and resolution of anemia   RTC in 1 month with labs

## 2023-04-21 NOTE — Telephone Encounter (Signed)
error 

## 2023-04-21 NOTE — Telephone Encounter (Signed)
Thanks for letting me know. Iron probably causing the stool to turn into black colour . But Dr Anders Simmonds have ordered blood work for him and he should get it done to monitor his blood count

## 2023-04-21 NOTE — Telephone Encounter (Signed)
You saw patient for IDA and referred to hematology. He saw Dr. Anders Simmonds today. He called here and states Dr. Anders Simmonds asked him if he had dark stools. He had not been checking but looked at stool he had after getting home from appointment and it was dark. He told me he was taking iron daily. When asked if he was having fatigue, sob or dizziness he said he gets light headed when standing sometimes but he could not tell me how long it had been going on. I asked him if it just started or been going on for awhile and he did not know. He is scheduled to have venofer infusion on 9/16.   [phone].

## 2023-04-21 NOTE — Assessment & Plan Note (Addendum)
-  Likely secondary to iron deficiency anemia -Will continue to monitor

## 2023-04-21 NOTE — Patient Instructions (Signed)
Continue to take oral Iron pills everyday or every other day We will schedule you to get IV Iron next week- It will be 2 doses 1 week apart Monitor for bleeding

## 2023-04-25 ENCOUNTER — Inpatient Hospital Stay: Payer: Medicare HMO

## 2023-04-25 VITALS — BP 125/80 | HR 86 | Temp 98.5°F | Resp 18

## 2023-04-25 DIAGNOSIS — D500 Iron deficiency anemia secondary to blood loss (chronic): Secondary | ICD-10-CM | POA: Diagnosis not present

## 2023-04-25 DIAGNOSIS — F17210 Nicotine dependence, cigarettes, uncomplicated: Secondary | ICD-10-CM | POA: Diagnosis not present

## 2023-04-25 DIAGNOSIS — Z79899 Other long term (current) drug therapy: Secondary | ICD-10-CM | POA: Diagnosis not present

## 2023-04-25 DIAGNOSIS — D75838 Other thrombocytosis: Secondary | ICD-10-CM | POA: Diagnosis not present

## 2023-04-25 DIAGNOSIS — K922 Gastrointestinal hemorrhage, unspecified: Secondary | ICD-10-CM | POA: Diagnosis not present

## 2023-04-25 MED ORDER — ACETAMINOPHEN 325 MG PO TABS: 650.0000 mg | ORAL_TABLET | Freq: Once | ORAL | Status: DC

## 2023-04-25 MED ORDER — SODIUM CHLORIDE 0.9 % IV SOLN: Freq: Once | INTRAVENOUS | Status: AC

## 2023-04-25 MED ORDER — CETIRIZINE HCL 10 MG PO TABS: 5.0000 mg | ORAL_TABLET | Freq: Once | ORAL | Status: DC

## 2023-04-25 MED ORDER — CETIRIZINE HCL 10 MG PO TABS
5.0000 mg | ORAL_TABLET | Freq: Once | ORAL | Status: AC
  Administered 2023-04-25: 5 mg via ORAL

## 2023-04-25 MED ORDER — SODIUM CHLORIDE 0.9 % IV SOLN
400.0000 mg | Freq: Once | INTRAVENOUS | Status: AC
  Administered 2023-04-25: 400 mg via INTRAVENOUS
  Filled 2023-04-25: qty 400

## 2023-04-25 NOTE — Patient Instructions (Signed)
[facility]  Discharge Instructions: Thank you for choosing [facility] to provide your oncology and hematology care.  If you have a lab appointment with the Cancer Center - please note that after April 8th, 2024, all labs will be drawn in the cancer center.  You do not have to check in or register with the main entrance as you have in the past but will complete your check-in in the cancer center.  Wear comfortable clothing and clothing appropriate for easy access to any Portacath or PICC line.   We strive to give you quality time with your provider. You may need to reschedule your appointment if you arrive late (15 or more minutes).  Arriving late affects you and other patients whose appointments are after yours.  Also, if you miss three or more appointments without notifying the office, you may be dismissed from the clinic at the provider's discretion.      For prescription refill requests, have your pharmacy contact our office and allow 72 hours for refills to be completed.    Today you received the following:  Venofer.  Iron Sucrose Injection What is this medication? IRON SUCROSE (EYE ern SOO krose) treats low levels of iron (iron deficiency anemia) in people with kidney disease. Iron is a mineral that plays an important role in making red blood cells, which carry oxygen from your lungs to the rest of your body. This medicine may be used for other purposes; ask your health care provider or pharmacist if you have questions. COMMON BRAND NAME(S): Venofer What should I tell my care team before I take this medication? They need to know if you have any of these conditions: Anemia not caused by low iron levels Heart disease High levels of iron in the blood Kidney disease Liver disease An unusual or allergic reaction to iron, other medications, foods, dyes, or preservatives Pregnant or trying to get pregnant Breastfeeding How should I use this  medication? This medication is for infusion into a vein. It is given in a hospital or clinic setting. Talk to your care team about the use of this medication in children. While this medication may be prescribed for children as young as 2 years for selected conditions, precautions do apply. Overdosage: If you think you have taken too much of this medicine contact a poison control center or emergency room at once. NOTE: This medicine is only for you. Do not share this medicine with others. What if I miss a dose? Keep appointments for follow-up doses. It is important not to miss your dose. Call your care team if you are unable to keep an appointment. What may interact with this medication? Do not take this medication with any of the following: Deferoxamine Dimercaprol Other iron products This medication may also interact with the following: Chloramphenicol Deferasirox This list may not describe all possible interactions. Give your health care provider a list of all the medicines, herbs, non-prescription drugs, or dietary supplements you use. Also tell them if you smoke, drink alcohol, or use illegal drugs. Some items may interact with your medicine. What should I watch for while using this medication? Visit your care team regularly. Tell your care team if your symptoms do not start to get better or if they get worse. You may need blood work done while you are taking this medication. You may need to follow a special diet. Talk to your care team. Foods that contain iron include: whole grains/cereals, dried fruits, beans,  or peas, leafy green vegetables, and organ meats (liver, kidney). What side effects may I notice from receiving this medication? Side effects that you should report to your care team as soon as possible: Allergic reactions--skin rash, itching, hives, swelling of the face, lips, tongue, or throat Low blood pressure--dizziness, feeling faint or lightheaded, blurry vision Shortness of  breath Side effects that usually do not require medical attention (report to your care team if they continue or are bothersome): Flushing Headache Joint pain Muscle pain Nausea Pain, redness, or irritation at injection site This list may not describe all possible side effects. Call your doctor for medical advice about side effects. You may report side effects to FDA at 1-800-FDA-1088. Where should I keep my medication? This medication is given in a hospital or clinic. It will not be stored at home. NOTE: This sheet is a summary. It may not cover all possible information. If you have questions about this medicine, talk to your doctor, pharmacist, or health care provider.  2024 Elsevier/Gold Standard (2022-12-31 00:00:00)     To help prevent nausea and vomiting after your treatment, we encourage you to take your nausea medication as directed.  BELOW ARE SYMPTOMS THAT SHOULD BE REPORTED IMMEDIATELY: *FEVER GREATER THAN 100.4 F (38 C) OR HIGHER *CHILLS OR SWEATING *NAUSEA AND VOMITING THAT IS NOT CONTROLLED WITH YOUR NAUSEA MEDICATION *UNUSUAL SHORTNESS OF BREATH *UNUSUAL BRUISING OR BLEEDING *URINARY PROBLEMS (pain or burning when urinating, or frequent urination) *BOWEL PROBLEMS (unusual diarrhea, constipation, pain near the anus) TENDERNESS IN MOUTH AND THROAT WITH OR WITHOUT PRESENCE OF ULCERS (sore throat, sores in mouth, or a toothache) UNUSUAL RASH, SWELLING OR PAIN  UNUSUAL VAGINAL DISCHARGE OR ITCHING   Items with * indicate a potential emergency and should be followed up as soon as possible or go to the Emergency Department if any problems should occur.  Please show the CHEMOTHERAPY ALERT CARD or IMMUNOTHERAPY ALERT CARD at check-in to the Emergency Department and triage nurse.  Should you have questions after your visit or need to cancel or reschedule your appointment, please contact [facility] AT [facility] [phone]  and follow the prompts.  Office hours are  8:00 a.m. to 4:30 p.m. Monday - Friday. Please note that voicemails left after 4:00 p.m. may not be returned until the following business day.  We are closed weekends and major holidays. You have access to a nurse at all times for urgent questions. Please call the main number to the clinic [phone] and follow the prompts.  For any non-urgent questions, you may also contact your provider using MyChart. We now offer e-Visits for anyone 75 and older to request care online for non-urgent symptoms. For details visit [email or web address].   Also download the MyChart app! Go to the app store, search "MyChart", open the app, select [place], and log in with your MyChart username and password.

## 2023-04-25 NOTE — Progress Notes (Signed)
Patient presents today for iron infusion.  Patient is in satisfactory condition with no new complaints voiced.  Vital signs are stable.  IV placed in L arm.  IV flushed well with good blood return noted.  Tylenol taken at 0830 at home prior to appointment.  We will proceed with infusion per provider orders.    Patient tolerated treatment well with no complaints voiced.  Patient left ambulatory with cane in stable condition.  Vital signs stable at discharge.  Follow up as scheduled.

## 2023-05-02 ENCOUNTER — Inpatient Hospital Stay: Payer: Medicare HMO

## 2023-05-02 VITALS — BP 126/84 | HR 86 | Temp 98.5°F | Resp 18

## 2023-05-02 DIAGNOSIS — F17210 Nicotine dependence, cigarettes, uncomplicated: Secondary | ICD-10-CM | POA: Diagnosis not present

## 2023-05-02 DIAGNOSIS — D75838 Other thrombocytosis: Secondary | ICD-10-CM | POA: Diagnosis not present

## 2023-05-02 DIAGNOSIS — Z79899 Other long term (current) drug therapy: Secondary | ICD-10-CM | POA: Diagnosis not present

## 2023-05-02 DIAGNOSIS — K922 Gastrointestinal hemorrhage, unspecified: Secondary | ICD-10-CM | POA: Diagnosis not present

## 2023-05-02 DIAGNOSIS — D500 Iron deficiency anemia secondary to blood loss (chronic): Secondary | ICD-10-CM | POA: Diagnosis not present

## 2023-05-02 MED ORDER — CETIRIZINE HCL 10 MG PO TABS
10.0000 mg | ORAL_TABLET | Freq: Once | ORAL | Status: AC
  Administered 2023-05-02: 10 mg via ORAL
  Filled 2023-05-02: qty 1

## 2023-05-02 MED ORDER — SODIUM CHLORIDE 0.9 % IV SOLN
400.0000 mg | Freq: Once | INTRAVENOUS | Status: AC
  Administered 2023-05-02: 400 mg via INTRAVENOUS
  Filled 2023-05-02: qty 400

## 2023-05-02 MED ORDER — ACETAMINOPHEN 325 MG PO TABS
650.0000 mg | ORAL_TABLET | Freq: Once | ORAL | Status: AC
  Administered 2023-05-02: 650 mg via ORAL
  Filled 2023-05-02: qty 2

## 2023-05-02 MED ORDER — SODIUM CHLORIDE 0.9 % IV SOLN: Freq: Once | INTRAVENOUS | Status: DC

## 2023-05-02 NOTE — Patient Instructions (Signed)
[facility]  Discharge Instructions: Thank you for choosing [facility] to provide your oncology and hematology care.  If you have a lab appointment with the Cancer Center - please note that after April 8th, 2024, all labs will be drawn in the cancer center.  You do not have to check in or register with the main entrance as you have in the past but will complete your check-in in the cancer center.  Wear comfortable clothing and clothing appropriate for easy access to any Portacath or PICC line.   We strive to give you quality time with your provider. You may need to reschedule your appointment if you arrive late (15 or more minutes).  Arriving late affects you and other patients whose appointments are after yours.  Also, if you miss three or more appointments without notifying the office, you may be dismissed from the clinic at the provider's discretion.      For prescription refill requests, have your pharmacy contact our office and allow 72 hours for refills to be completed.    Today you received the following iron infusion: venofer   To help prevent nausea and vomiting after your treatment, we encourage you to take your nausea medication as directed.  BELOW ARE SYMPTOMS THAT SHOULD BE REPORTED IMMEDIATELY: *FEVER GREATER THAN 100.4 F (38 C) OR HIGHER *CHILLS OR SWEATING *NAUSEA AND VOMITING THAT IS NOT CONTROLLED WITH YOUR NAUSEA MEDICATION *UNUSUAL SHORTNESS OF BREATH *UNUSUAL BRUISING OR BLEEDING *URINARY PROBLEMS (pain or burning when urinating, or frequent urination) *BOWEL PROBLEMS (unusual diarrhea, constipation, pain near the anus) TENDERNESS IN MOUTH AND THROAT WITH OR WITHOUT PRESENCE OF ULCERS (sore throat, sores in mouth, or a toothache) UNUSUAL RASH, SWELLING OR PAIN  UNUSUAL VAGINAL DISCHARGE OR ITCHING   Items with * indicate a potential emergency and should be followed up as soon as possible or go to the Emergency Department if any  problems should occur.  Please show the CHEMOTHERAPY ALERT CARD or IMMUNOTHERAPY ALERT CARD at check-in to the Emergency Department and triage nurse.  Should you have questions after your visit or need to cancel or reschedule your appointment, please contact [facility] AT [facility] [phone]  and follow the prompts.  Office hours are 8:00 a.m. to 4:30 p.m. Monday - Friday. Please note that voicemails left after 4:00 p.m. may not be returned until the following business day.  We are closed weekends and major holidays. You have access to a nurse at all times for urgent questions. Please call the main number to the clinic [phone] and follow the prompts.  For any non-urgent questions, you may also contact your provider using MyChart. We now offer e-Visits for anyone 71 and older to request care online for non-urgent symptoms. For details visit [email or web address].   Also download the MyChart app! Go to the app store, search "MyChart", open the app, select [place], and log in with your MyChart username and password.

## 2023-05-02 NOTE — Progress Notes (Signed)
Venofer iron infusion given per orders. Patient tolerated it well without problems. Vitals stable and discharged home from clinic ambulatory. Follow up as scheduled.

## 2023-05-13 NOTE — Telephone Encounter (Signed)
Will call pt with November schedule since no openings at this time

## 2023-05-20 ENCOUNTER — Inpatient Hospital Stay: Payer: Medicare HMO | Attending: Oncology

## 2023-05-20 DIAGNOSIS — D509 Iron deficiency anemia, unspecified: Secondary | ICD-10-CM | POA: Insufficient documentation

## 2023-05-20 DIAGNOSIS — K922 Gastrointestinal hemorrhage, unspecified: Secondary | ICD-10-CM | POA: Insufficient documentation

## 2023-05-20 DIAGNOSIS — D500 Iron deficiency anemia secondary to blood loss (chronic): Secondary | ICD-10-CM

## 2023-05-20 DIAGNOSIS — D75838 Other thrombocytosis: Secondary | ICD-10-CM | POA: Insufficient documentation

## 2023-05-20 LAB — CBC WITH DIFFERENTIAL/PLATELET
Abs Immature Granulocytes: 0.02 10*3/uL (ref 0.00–0.07)
Basophils Absolute: 0 10*3/uL (ref 0.0–0.1)
Basophils Relative: 1 %
Eosinophils Absolute: 0.1 10*3/uL (ref 0.0–0.5)
Eosinophils Relative: 1 %
HCT: 40.2 % (ref 39.0–52.0)
Hemoglobin: 12.4 g/dL — ABNORMAL LOW (ref 13.0–17.0)
Immature Granulocytes: 0 %
Lymphocytes Relative: 14 %
Lymphs Abs: 0.9 10*3/uL (ref 0.7–4.0)
MCH: 26.2 pg (ref 26.0–34.0)
MCHC: 30.8 g/dL (ref 30.0–36.0)
MCV: 84.8 fL (ref 80.0–100.0)
Monocytes Absolute: 1.1 10*3/uL — ABNORMAL HIGH (ref 0.1–1.0)
Monocytes Relative: 17 %
Neutro Abs: 4.4 10*3/uL (ref 1.7–7.7)
Neutrophils Relative %: 67 %
Platelets: 216 10*3/uL (ref 150–400)
RBC: 4.74 MIL/uL (ref 4.22–5.81)
RDW: 29.9 % — ABNORMAL HIGH (ref 11.5–15.5)
WBC: 6.5 10*3/uL (ref 4.0–10.5)
nRBC: 0 % (ref 0.0–0.2)

## 2023-05-20 LAB — IRON AND TIBC
Iron: 47 ug/dL (ref 45–182)
Saturation Ratios: 15 % — ABNORMAL LOW (ref 17.9–39.5)
TIBC: 307 ug/dL (ref 250–450)
UIBC: 260 ug/dL

## 2023-05-20 LAB — FERRITIN: Ferritin: 131 ng/mL (ref 24–336)

## 2023-05-20 LAB — FOLATE: Folate: 7.3 ng/mL (ref >5.9)

## 2023-05-20 LAB — VITAMIN B12: Vitamin B-12: 198 pg/mL (ref 180–914)

## 2023-05-21 ENCOUNTER — Other Ambulatory Visit (INDEPENDENT_AMBULATORY_CARE_PROVIDER_SITE_OTHER): Payer: Self-pay | Admitting: Gastroenterology

## 2023-05-21 DIAGNOSIS — K279 Peptic ulcer, site unspecified, unspecified as acute or chronic, without hemorrhage or perforation: Secondary | ICD-10-CM

## 2023-05-27 ENCOUNTER — Inpatient Hospital Stay: Payer: Medicare HMO | Admitting: Oncology

## 2023-05-27 ENCOUNTER — Encounter: Payer: Self-pay | Admitting: Oncology

## 2023-05-27 VITALS — BP 130/82 | HR 82 | Temp 98.3°F | Resp 18 | Ht 72.0 in | Wt 128.0 lb

## 2023-05-27 DIAGNOSIS — D509 Iron deficiency anemia, unspecified: Secondary | ICD-10-CM | POA: Diagnosis not present

## 2023-05-27 DIAGNOSIS — K254 Chronic or unspecified gastric ulcer with hemorrhage: Secondary | ICD-10-CM | POA: Diagnosis not present

## 2023-05-27 DIAGNOSIS — K922 Gastrointestinal hemorrhage, unspecified: Secondary | ICD-10-CM | POA: Diagnosis not present

## 2023-05-27 DIAGNOSIS — D75838 Other thrombocytosis: Secondary | ICD-10-CM

## 2023-05-27 DIAGNOSIS — D500 Iron deficiency anemia secondary to blood loss (chronic): Secondary | ICD-10-CM

## 2023-05-27 NOTE — Assessment & Plan Note (Signed)
Unclear when next endoscopy is scheduled with Dr. Tasia Catchings. -Will reach out to Dr. Tasia Catchings to clarify follow-up plan. -Avoid NSAIDs, continue taking Protonix

## 2023-05-27 NOTE — Assessment & Plan Note (Signed)
Hemoglobin improved from 6 to 12.4 after iron infusions. No current symptoms of anemia. No evidence of ongoing GI bleeding. -Administer one more dose of IV iron. -Continue oral iron supplementation every other day. -Check labs in 1 month.  If stable or improved we will space out her visits to every 3 months.

## 2023-05-27 NOTE — Progress Notes (Signed)
[facility] at [facility]  HEMATOLOGY FOLLOW-UP VISIT  Kevin Deiters, MD  REASON FOR FOLLOW-UP: Iron deficiency anemia  ASSESSMENT & PLAN:  Patient is a 67 year old male with past medical history of hypertension and neurofibromatosis referred by his gastroenterologist for iron deficiency anemia.  Iron deficiency anemia Hemoglobin improved from 6 to 12.4 after iron infusions. No current symptoms of anemia. No evidence of ongoing GI bleeding. -Administer one more dose of IV iron. -Continue oral iron supplementation every other day. -Check labs in 1 month.  If stable or improved we will space out her visits to every 3 months.   Reactive thrombocytosis -Thrombocytosis on last visit, likely reactive -Resolved at this time  GI bleeding Unclear when next endoscopy is scheduled with Dr. Tasia Catchings. -Will reach out to Dr. Tasia Catchings to clarify follow-up plan. -Avoid NSAIDs, continue taking Protonix   Orders Placed This Encounter  Procedures   CBC with Differential/Platelet    Standing Status:   Future    Standing Expiration Date:   05/26/2024   Ferritin    Standing Status:   Future    Standing Expiration Date:   05/26/2024   Iron and TIBC    Standing Status:   Future    Standing Expiration Date:   05/26/2024    The total time spent in the appointment was 15 minutes encounter with patients including review of chart and various tests results, discussions about plan of care and coordination of care plan   All questions were answered. The patient knows to call the clinic with any problems, questions or concerns. No barriers to learning was detected.  Cindie Crumbly, MD 10/18/20248:50 AM   SUMMARY OF HEMATOLOGIC HISTORY:    Latest Ref Rng & Units 05/20/2023   10:10 AM 04/06/2023    8:40 AM 02/25/2023    9:23 AM  CBC  WBC 4.0 - 10.5 K/uL 6.5  9.6  14.1   Hemoglobin 13.0 - 17.0 g/dL 16.1  8.1  8.0   Hematocrit 39.0 - 52.0 % 40.2  27.5  24.8   Platelets 150 -  400 K/uL 216  560  156     Lab Results  Component Value Date   IRON 47 05/20/2023   TIBC 307 05/20/2023   FERRITIN 131 05/20/2023      INTERVAL HISTORY: Kevin Dalton 67 y.o. male is here for follow-up for iron deficiency anemia.  He reports that his fatigue is improved. He reports persistent dizziness, which he attributes to his blood pressure medication, Plendil. He has not adjusted the dosage of his medication. He denies shortness of breath.  He also has a history of a gastric ulcer, for which he is under the care of a gastroenterologist, Dr. Tasia Catchings. He is unsure of the follow-up plan for this issue and plans to reach out to Dr. Tasia Catchings for clarification.  In addition, he has been receiving iron infusions for his anemia, which have improved his hemoglobin levels from 6 to 12.4. He also takes iron pills every other day, which do not cause constipation. He denies any blood or black color in his stools. He has not experienced any recent weight loss and his appetite is good.  I have reviewed the past medical history, past surgical history, social history and family history with the patient   ALLERGIES:  has No Known Allergies.  MEDICATIONS:  Current Outpatient Medications  Medication Sig Dispense Refill   amLODipine (NORVASC) 5 MG tablet Take 1 tablet (5 mg total) by  mouth daily. 30 tablet 0   benazepril (LOTENSIN) 10 MG tablet Take 10 mg by mouth daily.     ibandronate (BONIVA) 150 MG tablet Take 150 mg by mouth every 30 (thirty) days.     methocarbamol (ROBAXIN) 500 MG tablet Take 1 tablet (500 mg total) by mouth every 6 (six) hours as needed for muscle spasms. 50 tablet 0   NON FORMULARY Diet:Regular     pantoprazole (PROTONIX) 40 MG tablet TAKE 1 TABLET BY MOUTH EVERY DAY 90 tablet 1   No current facility-administered medications for this visit.     REVIEW OF SYSTEMS:   Constitutional: Denies fevers, chills or night sweats Eyes: Denies blurriness of vision Ears, nose, mouth,  throat, and face: Denies mucositis or sore throat Respiratory: Denies cough, dyspnea or wheezes Cardiovascular: Denies palpitation, chest discomfort or lower extremity swelling Gastrointestinal:  Denies nausea, heartburn or change in bowel habits Skin: Denies abnormal skin rashes Lymphatics: Denies new lymphadenopathy or easy bruising Neurological:Denies numbness, tingling or new weaknesses Behavioral/Psych: Mood is stable, no new changes  All other systems were reviewed with the patient and are negative.  PHYSICAL EXAMINATION:   Vitals:   05/27/23 0812  BP: 130/82  Pulse: 82  Resp: 18  Temp: 98.3 F (36.8 C)  SpO2: 100%    GENERAL:alert, no distress and comfortable SKIN: skin color, texture, turgor are normal, diffuse neurofibromatosis lesions all through the body EYES: normal, Conjunctiva are pink and non-injected, sclera clear LUNGS: clear to auscultation and percussion with normal breathing effort HEART: regular rate & rhythm and no murmurs and no lower extremity edema ABDOMEN:abdomen soft, non-tender and normal bowel sounds Musculoskeletal:no cyanosis of digits and no clubbing  NEURO: alert & oriented x 3 with fluent speech  LABORATORY DATA:  I have reviewed the data as listed  Lab Results  Component Value Date   WBC 6.5 05/20/2023   NEUTROABS 4.4 05/20/2023   HGB 12.4 (L) 05/20/2023   HCT 40.2 05/20/2023   MCV 84.8 05/20/2023   PLT 216 05/20/2023      Component Value Date/Time   NA 136 02/24/2023 0448   K 4.1 02/24/2023 0448   CL 110 02/24/2023 0448   CO2 21 (L) 02/24/2023 0448   GLUCOSE 95 02/24/2023 0448   BUN 29 (H) 02/24/2023 0448   CREATININE 0.67 02/24/2023 0448   CALCIUM 8.0 (L) 02/24/2023 0448   PROT 4.9 (L) 02/23/2023 1634   ALBUMIN 2.6 (L) 02/23/2023 1634   AST 12 (L) 02/23/2023 1634   ALT 9 02/23/2023 1634   ALKPHOS 53 02/23/2023 1634   BILITOT 0.4 02/23/2023 1634   GFRNONAA >60 02/24/2023 0448   GFRAA >60 02/27/2020 1531        Chemistry      Component Value Date/Time   NA 136 02/24/2023 0448   K 4.1 02/24/2023 0448   CL 110 02/24/2023 0448   CO2 21 (L) 02/24/2023 0448   BUN 29 (H) 02/24/2023 0448   CREATININE 0.67 02/24/2023 0448      Component Value Date/Time   CALCIUM 8.0 (L) 02/24/2023 0448   ALKPHOS 53 02/23/2023 1634   AST 12 (L) 02/23/2023 1634   ALT 9 02/23/2023 1634   BILITOT 0.4 02/23/2023 1634     Lab Results  Component Value Date   IRON 47 05/20/2023   TIBC 307 05/20/2023   FERRITIN 131 05/20/2023

## 2023-05-27 NOTE — Telephone Encounter (Signed)
Pt contacted and scheduled for 06/23/23 at 8:45am. Will mail instructions once pre op appt received. PA approved via [employer].

## 2023-05-27 NOTE — Assessment & Plan Note (Signed)
-  Thrombocytosis on last visit, likely reactive -Resolved at this time

## 2023-05-27 NOTE — Patient Instructions (Signed)
VISIT SUMMARY:  Dear Kevin Dalton, during your recent visit, we discussed your ongoing issues with anemia, high blood pressure, and a gastric ulcer. I'm pleased to note that your anemia has improved significantly with iron infusions and oral iron supplementation. However, you've reported persistent dizziness, which may be related to your blood pressure medication. We also discussed your gastric ulcer and the need for clarity on your follow-up plan with your gastroenterologist, Dr. Tasia Catchings.  YOUR PLAN:  -IRON DEFICIENCY ANEMIA: Your anemia has improved significantly due to the iron infusions and oral iron supplementation. Anemia is a condition where your body lacks enough healthy red blood cells to carry adequate oxygen to your body's tissues. We will administer one more dose of IV iron and continue with the oral iron supplementation every other day. We will check your hemoglobin levels again in a month.  -HYPERTENSION: You've reported feeling lightheaded, which might be due to your blood pressure medication. Hypertension, or high blood pressure, is a condition where the force of the blood against your artery walls is too high. I advise you to discuss these symptoms with your primary care provider.  -GASTRIC ULCER: You have a gastric ulcer, which is a sore that develops on the lining of the stomach. It's unclear when your next endoscopy is scheduled with Dr. Tasia Catchings. I will send a message to Dr. Tasia Catchings to clarify your follow-up plan and advise you to reach out to his office as well.  INSTRUCTIONS:  Please continue taking your medications as prescribed. Reach out to your primary care provider to discuss the dizziness you've been experiencing. Also, please contact Dr. Marcelino Freestone office to clarify your follow-up plan for your gastric ulcer. We will see you again in a month to check your hemoglobin levels and discuss your overall health.

## 2023-05-29 ENCOUNTER — Other Ambulatory Visit: Payer: Self-pay

## 2023-05-29 ENCOUNTER — Emergency Department (HOSPITAL_COMMUNITY)
Admission: EM | Admit: 2023-05-29 | Discharge: 2023-05-29 | Disposition: A | Discharge status: Home / Self Care | Payer: Medicare HMO | Attending: Emergency Medicine | Admitting: Emergency Medicine

## 2023-05-29 ENCOUNTER — Encounter (HOSPITAL_COMMUNITY): Payer: Self-pay | Admitting: Emergency Medicine

## 2023-05-29 DIAGNOSIS — Z79899 Other long term (current) drug therapy: Secondary | ICD-10-CM | POA: Insufficient documentation

## 2023-05-29 DIAGNOSIS — I10 Essential (primary) hypertension: Secondary | ICD-10-CM | POA: Insufficient documentation

## 2023-05-29 DIAGNOSIS — R339 Retention of urine, unspecified: Secondary | ICD-10-CM | POA: Insufficient documentation

## 2023-05-29 LAB — URINALYSIS, ROUTINE W REFLEX MICROSCOPIC
Bilirubin Urine: NEGATIVE
Glucose, UA: NEGATIVE mg/dL
Hgb urine dipstick: NEGATIVE
Ketones, ur: NEGATIVE mg/dL
Leukocytes,Ua: NEGATIVE
Nitrite: NEGATIVE
Protein, ur: NEGATIVE mg/dL
Specific Gravity, Urine: 1.004 — ABNORMAL LOW (ref 1.005–1.030)
pH: 8 (ref 5.0–8.0)

## 2023-05-29 NOTE — ED Provider Notes (Signed)
[place] EMERGENCY DEPARTMENT AT [facility] Provider Note   CSN: [id number] Arrival date & time: 05/29/23  0124     History  No chief complaint on file.   Kevin Dalton is a 67 y.o. male.  Patient is a 67 year old male with past medical history of neurofibromatosis, anemia, hypertension.  Patient presenting today for evaluation of possible UTI.  He describes difficulty urinating and only being able to urinate very small amounts.  This has been ongoing since earlier today.  He denies any fevers or chills.  He does report feeling some pressure in the suprapubic region.  No bowel complaints.  He denies history of prostate issues.  The history is provided by the patient.       Home Medications Prior to Admission medications   Medication Sig Start Date End Date Taking? Authorizing Provider  amLODipine (NORVASC) 5 MG tablet Take 1 tablet (5 mg total) by mouth daily. 10/19/21   Sharee Holster, NP  benazepril (LOTENSIN) 10 MG tablet Take 10 mg by mouth daily. 01/26/23   [provider]  ibandronate (BONIVA) 150 MG tablet Take 150 mg by mouth every 30 (thirty) days. 02/16/23   [provider]  methocarbamol (ROBAXIN) 500 MG tablet Take 1 tablet (500 mg total) by mouth every 6 (six) hours as needed for muscle spasms. 10/19/21   Sharee Holster, NP  NON FORMULARY Diet:Regular    [provider]  pantoprazole (PROTONIX) 40 MG tablet TAKE 1 TABLET BY MOUTH EVERY DAY 05/24/23   Ahmed, Juanetta Beets, MD      Allergies    Patient has no known allergies.    Review of Systems   Review of Systems  All other systems reviewed and are negative.   Physical Exam Updated Vital Signs BP (!) 167/116 (BP Location: Left Arm)   Pulse 95   Temp 98.3 F (36.8 C) (Oral)   Resp 18   Ht 6' (1.829 m)   Wt 58 kg   SpO2 100%   BMI 17.34 kg/m  Physical Exam Vitals and nursing note reviewed.  Constitutional:      General: He is not in acute distress.     Appearance: He is well-developed. He is not diaphoretic.  HENT:     Head: Normocephalic and atraumatic.  Cardiovascular:     Rate and Rhythm: Normal rate and regular rhythm.     Heart sounds: No murmur heard.    No friction rub.  Pulmonary:     Effort: Pulmonary effort is normal. No respiratory distress.     Breath sounds: Normal breath sounds. No wheezing or rales.  Abdominal:     General: Bowel sounds are normal. There is no distension.     Palpations: Abdomen is soft.     Tenderness: There is no abdominal tenderness.  Musculoskeletal:        General: Normal range of motion.     Cervical back: Normal range of motion and neck supple.  Skin:    General: Skin is warm and dry.  Neurological:     Mental Status: He is alert and oriented to person, place, and time.     Coordination: Coordination normal.     ED Results / Procedures / Treatments   Labs (all labs ordered are listed, but only abnormal results are displayed) Labs Reviewed  URINALYSIS, ROUTINE W REFLEX MICROSCOPIC    EKG None  Radiology No results found.  Procedures Procedures    Medications Ordered in ED Medications -  No data to display  ED Course/  Patient presenting here with complaints of urinary retention as described in the HPI.  He has been unable to void since earlier today even though he feels the urge to do so.  Patient arrives here with stable vital signs and is afebrile.  Physical examination basically unremarkable.  A Foley catheter was placed.  Approximately 800 cc of urine were obtained.  Urine was clear and showed no evidence for infection on urinalysis.  The cause of urinary retention is unclear, however I suspect a prostate issue.  I have discussed risks and benefits of removing the Foley versus leaving it in.  Patient has elected to have the Foley removed and will follow-up with his primary doctor.  He understands he may have to return if he is unable to void once he returns home.  Final  Clinical Impression(s) / ED Diagnoses Final diagnoses:  None    Rx / DC Orders ED Discharge Orders     None         Geoffery Lyons, MD 05/29/23 [phone]

## 2023-05-29 NOTE — ED Triage Notes (Signed)
Pt states he thinks he may have a UTI. States he feels like he has to go to the bathroom frequently but that "only a little comes out". Denies pain or burning with urination. States this started around 1am.

## 2023-05-29 NOTE — Discharge Instructions (Signed)
Follow-up with urology in the next few days.  The contact information for Dr. Ronne Binning has been provided in this discharge summary for you to call and make these arrangements.  Return to the ER if you are unable to urinate, or develop any other new and/or concerning issues.

## 2023-06-02 ENCOUNTER — Inpatient Hospital Stay: Payer: Medicare HMO

## 2023-06-02 VITALS — BP 116/85 | HR 86 | Temp 98.7°F | Resp 19

## 2023-06-02 DIAGNOSIS — K922 Gastrointestinal hemorrhage, unspecified: Secondary | ICD-10-CM | POA: Diagnosis not present

## 2023-06-02 DIAGNOSIS — D509 Iron deficiency anemia, unspecified: Secondary | ICD-10-CM | POA: Diagnosis not present

## 2023-06-02 DIAGNOSIS — D75838 Other thrombocytosis: Secondary | ICD-10-CM | POA: Diagnosis not present

## 2023-06-02 DIAGNOSIS — D500 Iron deficiency anemia secondary to blood loss (chronic): Secondary | ICD-10-CM

## 2023-06-02 MED ORDER — CETIRIZINE HCL 10 MG PO TABS
10.0000 mg | ORAL_TABLET | Freq: Once | ORAL | Status: AC
  Administered 2023-06-02: 10 mg via ORAL
  Filled 2023-06-02: qty 1

## 2023-06-02 MED ORDER — SODIUM CHLORIDE 0.9 % IV SOLN
400.0000 mg | Freq: Once | INTRAVENOUS | Status: AC
  Administered 2023-06-02: 400 mg via INTRAVENOUS
  Filled 2023-06-02: qty 20

## 2023-06-02 MED ORDER — SODIUM CHLORIDE 0.9 % IV SOLN: Freq: Once | INTRAVENOUS | Status: AC

## 2023-06-02 MED ORDER — SODIUM CHLORIDE 0.9% FLUSH
10.0000 mL | Freq: Two times a day (BID) | INTRAVENOUS | Status: DC
  Administered 2023-06-02: 10 mL via INTRAVENOUS

## 2023-06-02 MED ORDER — ACETAMINOPHEN 325 MG PO TABS
650.0000 mg | ORAL_TABLET | Freq: Once | ORAL | Status: AC
  Administered 2023-06-02: 650 mg via ORAL
  Filled 2023-06-02: qty 2

## 2023-06-02 NOTE — Progress Notes (Signed)
Patient tolerated iron infusion with no complaints voiced.  Peripheral IV site clean and dry with good blood return noted before and after infusion. Pt observed for 30 minutes post venofer infusion with no complications.  VSS with discharge and left in satisfactory condition with no s/s of distress noted. All follow ups as scheduled.   Kevin Dalton [employer]

## 2023-06-02 NOTE — Patient Instructions (Signed)
Iron Sucrose Injection What is this medication? IRON SUCROSE (EYE ern SOO krose) treats low levels of iron (iron deficiency anemia) in people with kidney disease. Iron is a mineral that plays an important role in making red blood cells, which carry oxygen from your lungs to the rest of your body. This medicine may be used for other purposes; ask your health care provider or pharmacist if you have questions. COMMON BRAND NAME(S): Venofer What should I tell my care team before I take this medication? They need to know if you have any of these conditions: Anemia not caused by low iron levels Heart disease High levels of iron in the blood Kidney disease Liver disease An unusual or allergic reaction to iron, other medications, foods, dyes, or preservatives Pregnant or trying to get pregnant Breastfeeding How should I use this medication? This medication is for infusion into a vein. It is given in a hospital or clinic setting. Talk to your care team about the use of this medication in children. While this medication may be prescribed for children as young as 2 years for selected conditions, precautions do apply. Overdosage: If you think you have taken too much of this medicine contact a poison control center or emergency room at once. NOTE: This medicine is only for you. Do not share this medicine with others. What if I miss a dose? Keep appointments for follow-up doses. It is important not to miss your dose. Call your care team if you are unable to keep an appointment. What may interact with this medication? Do not take this medication with any of the following: Deferoxamine Dimercaprol Other iron products This medication may also interact with the following: Chloramphenicol Deferasirox This list may not describe all possible interactions. Give your health care provider a list of all the medicines, herbs, non-prescription drugs, or dietary supplements you use. Also tell them if you smoke,  drink alcohol, or use illegal drugs. Some items may interact with your medicine. What should I watch for while using this medication? Visit your care team regularly. Tell your care team if your symptoms do not start to get better or if they get worse. You may need blood work done while you are taking this medication. You may need to follow a special diet. Talk to your care team. Foods that contain iron include: whole grains/cereals, dried fruits, beans, or peas, leafy green vegetables, and organ meats (liver, kidney). What side effects may I notice from receiving this medication? Side effects that you should report to your care team as soon as possible: Allergic reactions--skin rash, itching, hives, swelling of the face, lips, tongue, or throat Low blood pressure--dizziness, feeling faint or lightheaded, blurry vision Shortness of breath Side effects that usually do not require medical attention (report to your care team if they continue or are bothersome): Flushing Headache Joint pain Muscle pain Nausea Pain, redness, or irritation at injection site This list may not describe all possible side effects. Call your doctor for medical advice about side effects. You may report side effects to FDA at 1-800-FDA-1088. Where should I keep my medication? This medication is given in a hospital or clinic. It will not be stored at home. NOTE: This sheet is a summary. It may not cover all possible information. If you have questions about this medicine, talk to your doctor, pharmacist, or health care provider.  2024 Elsevier/Gold Standard (2022-12-31 00:00:00)

## 2023-06-06 DIAGNOSIS — E782 Mixed hyperlipidemia: Secondary | ICD-10-CM | POA: Diagnosis not present

## 2023-06-06 DIAGNOSIS — Z96641 Presence of right artificial hip joint: Secondary | ICD-10-CM | POA: Diagnosis not present

## 2023-06-06 DIAGNOSIS — J449 Chronic obstructive pulmonary disease, unspecified: Secondary | ICD-10-CM | POA: Diagnosis not present

## 2023-06-06 DIAGNOSIS — I10 Essential (primary) hypertension: Secondary | ICD-10-CM | POA: Diagnosis not present

## 2023-06-06 DIAGNOSIS — R339 Retention of urine, unspecified: Secondary | ICD-10-CM | POA: Diagnosis not present

## 2023-06-06 DIAGNOSIS — Z681 Body mass index (BMI) 19 or less, adult: Secondary | ICD-10-CM | POA: Diagnosis not present

## 2023-06-06 DIAGNOSIS — Q8501 Neurofibromatosis, type 1: Secondary | ICD-10-CM | POA: Diagnosis not present

## 2023-06-20 ENCOUNTER — Encounter (HOSPITAL_COMMUNITY)
Admission: RE | Admit: 2023-06-20 | Discharge: 2023-06-20 | Disposition: A | Discharge status: Home / Self Care | Payer: Medicare HMO | Source: Ambulatory Visit | Attending: Gastroenterology | Admitting: Gastroenterology

## 2023-06-20 ENCOUNTER — Encounter (HOSPITAL_COMMUNITY): Payer: Self-pay

## 2023-06-20 ENCOUNTER — Other Ambulatory Visit: Payer: Self-pay

## 2023-06-22 NOTE — Anesthesia Preprocedure Evaluation (Signed)
Anesthesia Evaluation  Patient identified by MRN, date of birth, ID band Patient awake    Reviewed: Allergy & Precautions, H&P , NPO status , Patient's Chart, lab work & pertinent test results  Airway Mallampati: II  TM Distance: >3 FB Neck ROM: Full    Dental  (+) Dental Advisory Given, Missing, Poor Dentition Many missing teeth all over his mouth:   Pulmonary Current Smoker and Patient abstained from smoking.          Cardiovascular hypertension, Pt. on medications      Neuro/Psych negative neurological ROS  negative psych ROS   GI/Hepatic Neg liver ROS, PUD,,,  Endo/Other  negative endocrine ROS    Renal/GU negative Renal ROS  negative genitourinary   Musculoskeletal  (+) Arthritis , Osteoarthritis,    Abdominal   Peds negative pediatric ROS (+)  Hematology  (+) Blood dyscrasia, anemia   Anesthesia Other Findings Neurofibromatosis   Reproductive/Obstetrics negative OB ROS                             Anesthesia Physical Anesthesia Plan  ASA: 3  Anesthesia Plan: General   Post-op Pain Management: Minimal or no pain anticipated   Induction: Intravenous  PONV Risk Score and Plan: 1 and Propofol infusion  Airway Management Planned: Nasal Cannula and Natural Airway  Additional Equipment:   Intra-op Plan:   Post-operative Plan:   Informed Consent: I have reviewed the patients History and Physical, chart, labs and discussed the procedure including the risks, benefits and alternatives for the proposed anesthesia with the patient or authorized representative who has indicated his/her understanding and acceptance.    Discussed DNR with patient and Suspend DNR.   Dental advisory given  Plan Discussed with: CRNA  Anesthesia Plan Comments:         Anesthesia Quick Evaluation

## 2023-06-23 ENCOUNTER — Encounter (HOSPITAL_COMMUNITY)
Admission: RE | Disposition: A | Discharge status: Home / Self Care | Payer: Self-pay | Source: Home / Self Care | Attending: Gastroenterology

## 2023-06-23 ENCOUNTER — Ambulatory Visit (HOSPITAL_COMMUNITY): Payer: Medicare HMO | Admitting: Anesthesiology

## 2023-06-23 ENCOUNTER — Other Ambulatory Visit: Payer: Self-pay

## 2023-06-23 ENCOUNTER — Encounter (HOSPITAL_COMMUNITY): Payer: Self-pay

## 2023-06-23 ENCOUNTER — Inpatient Hospital Stay: Payer: Medicare HMO | Attending: Oncology

## 2023-06-23 ENCOUNTER — Ambulatory Visit (HOSPITAL_COMMUNITY)
Admission: RE | Admit: 2023-06-23 | Discharge: 2023-06-23 | Disposition: A | Discharge status: Home / Self Care | Payer: Medicare HMO | Attending: Gastroenterology | Admitting: Gastroenterology

## 2023-06-23 ENCOUNTER — Ambulatory Visit (HOSPITAL_COMMUNITY): Payer: Self-pay | Admitting: Anesthesiology

## 2023-06-23 DIAGNOSIS — K648 Other hemorrhoids: Secondary | ICD-10-CM | POA: Diagnosis not present

## 2023-06-23 DIAGNOSIS — D500 Iron deficiency anemia secondary to blood loss (chronic): Secondary | ICD-10-CM

## 2023-06-23 DIAGNOSIS — Z09 Encounter for follow-up examination after completed treatment for conditions other than malignant neoplasm: Secondary | ICD-10-CM | POA: Insufficient documentation

## 2023-06-23 DIAGNOSIS — I10 Essential (primary) hypertension: Secondary | ICD-10-CM | POA: Diagnosis not present

## 2023-06-23 DIAGNOSIS — F17210 Nicotine dependence, cigarettes, uncomplicated: Secondary | ICD-10-CM | POA: Insufficient documentation

## 2023-06-23 DIAGNOSIS — K279 Peptic ulcer, site unspecified, unspecified as acute or chronic, without hemorrhage or perforation: Secondary | ICD-10-CM

## 2023-06-23 DIAGNOSIS — M1990 Unspecified osteoarthritis, unspecified site: Secondary | ICD-10-CM | POA: Insufficient documentation

## 2023-06-23 DIAGNOSIS — K3189 Other diseases of stomach and duodenum: Secondary | ICD-10-CM | POA: Insufficient documentation

## 2023-06-23 DIAGNOSIS — Q8500 Neurofibromatosis, unspecified: Secondary | ICD-10-CM | POA: Diagnosis not present

## 2023-06-23 DIAGNOSIS — Z79899 Other long term (current) drug therapy: Secondary | ICD-10-CM | POA: Insufficient documentation

## 2023-06-23 DIAGNOSIS — Z860101 Personal history of adenomatous and serrated colon polyps: Secondary | ICD-10-CM | POA: Insufficient documentation

## 2023-06-23 DIAGNOSIS — K2970 Gastritis, unspecified, without bleeding: Secondary | ICD-10-CM

## 2023-06-23 DIAGNOSIS — Z8711 Personal history of peptic ulcer disease: Secondary | ICD-10-CM | POA: Insufficient documentation

## 2023-06-23 DIAGNOSIS — D75838 Other thrombocytosis: Secondary | ICD-10-CM | POA: Insufficient documentation

## 2023-06-23 DIAGNOSIS — D509 Iron deficiency anemia, unspecified: Secondary | ICD-10-CM | POA: Insufficient documentation

## 2023-06-23 HISTORY — PX: ESOPHAGOGASTRODUODENOSCOPY (EGD) WITH PROPOFOL: SHX5813

## 2023-06-23 HISTORY — PX: BIOPSY: SHX5522

## 2023-06-23 SURGERY — ESOPHAGOGASTRODUODENOSCOPY (EGD) WITH PROPOFOL
Anesthesia: General

## 2023-06-23 MED ORDER — LIDOCAINE HCL 1 % IJ SOLN
INTRAMUSCULAR | Status: DC | PRN
  Administered 2023-06-23: 50 mg via INTRADERMAL

## 2023-06-23 MED ORDER — PROPOFOL 10 MG/ML IV BOLUS
INTRAVENOUS | Status: DC | PRN
  Administered 2023-06-23: 100 mg via INTRAVENOUS
  Administered 2023-06-23 (×2): 50 mg via INTRAVENOUS
  Administered 2023-06-23: 20 mg via INTRAVENOUS

## 2023-06-23 MED ORDER — LACTATED RINGERS IV SOLN: INTRAVENOUS | Status: DC | PRN

## 2023-06-23 MED ORDER — STERILE WATER FOR IRRIGATION IR SOLN
Status: DC | PRN
  Administered 2023-06-23: 100 mL

## 2023-06-23 NOTE — H&P (Signed)
Primary Care Physician:  Toma Deiters, MD Primary Gastroenterologist:  Dr. Tasia Catchings  Pre-Procedure History & Physical: HPI:    Kevin Dalton is a 67 y.o. male with PMH of neurofibromatosis, HTN, adenomatous colon polyps is here for evaluation of peptic ulcer disease,Anemia from chronic blood loss and history of advance Ta's.   Patient was last seen in the hospital on 02/24/2019 for.  Presented with vomiting lightheadedness and generalized with hemoglobin of 6 requiring transfusion with a baseline of 11 .  Patient underwent upper endoscopy found to have a cratered gastric ulcer biopsies positive for H. pylori.  At that time patient was also taking daily Goodypowder which has 325 of aspirin   Today patient reports he is feeling better than before feeling stronger and denies any blood in the stool or melena.  He denies taking any further NSAIDs or Goody powder.  Patient reports that he did completed 14 days of clarithromycin based triple therapy and currently taking Protonix as directed.The patient denies having any nausea, vomiting, fever, chills, hematochezia, melena, hematemesis, abdominal distention, abdominal pain, diarrhea, jaundice, pruritus or weight loss.   Last EGD:02/2023   - Non- bleeding gastric ulcer with pigmented material [facility] IIc) . Biopsied. - Erosive gastritis. Biopsied. - Normal duodenal bulb and second portion of the duodenum. - Acquired deformity in the gastric antrum - Deformed antrum and pylorus .   -  Antral/small intestinal type mucosa with moderate chronic  inflammation and focal mild activity, suggestive of Helicobacter pylori  infection      Last Colonoscopy:   Colonoscopy 09/2021: -nonbleeding internal hemorrhoids -three 3-30mm polyps in the descending colon, tubular adenoma -3 year surveillance colonoscopy   Colonoscopy 01/2021: -nonbleeding internal hemorrhoids -one 10mm polyp in rectum -one 12mm polyp in the descending colon -one 22mm polyp in descending  colon -tubulovillous adenoma and tubular adenoma     FHx: neg for any gastrointestinal/liver disease, no malignancies Social: neg smoking, alcohol or illicit drug use Surgical: no abdominal surgeries  Past Medical History:  Diagnosis Date   Hypertension    Neurofibromatosis     Past Surgical History:  Procedure Laterality Date   BIOPSY  02/24/2023   Procedure: BIOPSY;  Surgeon: Franky Macho, MD;  [facility];  Service: Endoscopy;;   COLONOSCOPY WITH PROPOFOL N/A 01/23/2021   Procedure: COLONOSCOPY WITH PROPOFOL;  Surgeon: Lanelle Bal, DO;  Location: [facility];  Service: Endoscopy;  Laterality: N/A;  ASA I/II / 12:00   COLONOSCOPY WITH PROPOFOL N/A 09/22/2021   Procedure: COLONOSCOPY WITH PROPOFOL;  Surgeon: Lanelle Bal, DO;  [facility]: [facility];  Service: Endoscopy;  Laterality: N/A;  10:30 / ASA 2   ESOPHAGOGASTRODUODENOSCOPY (EGD) WITH PROPOFOL N/A 02/24/2023   Procedure: ESOPHAGOGASTRODUODENOSCOPY (EGD) WITH PROPOFOL;  Surgeon: Franky Macho, MD;  [facility];  Service: Endoscopy;  Laterality: N/A;   POLYPECTOMY  01/23/2021   Procedure: POLYPECTOMY;  Surgeon: Lanelle Bal, DO;  [facility];  Service: Endoscopy;;   POLYPECTOMY  09/22/2021   Procedure: POLYPECTOMY;  Surgeon: Lanelle Bal, DO;  [facility];  Service: Endoscopy;;   TOTAL HIP ARTHROPLASTY Right 10/02/2021   Procedure: RIGHT TOTAL HIP ARTHROPLASTY ANTERIOR APPROACH;  Surgeon: Eldred Manges, MD;  Location: [facility] OR;  Service: Orthopedics;  Laterality: Right;    Prior to Admission medications   Medication Sig Start Date End Date Taking? Authorizing Provider  amLODipine (NORVASC) 5 MG tablet Take 1 tablet (5 mg total)  by mouth daily. 10/19/21  Yes Sharee Holster, NP  benazepril (LOTENSIN) 10 MG tablet Take 10 mg by mouth daily. 01/26/23  Yes [provider]  pantoprazole (PROTONIX) 40 MG tablet TAKE 1 TABLET BY MOUTH EVERY DAY  05/24/23  Yes Shelaine Frie, Juanetta Beets, MD  ibandronate (BONIVA) 150 MG tablet Take 150 mg by mouth every 30 (thirty) days. 02/16/23   [provider]  methocarbamol (ROBAXIN) 500 MG tablet Take 1 tablet (500 mg total) by mouth every 6 (six) hours as needed for muscle spasms. 10/19/21   Sharee Holster, NP  NON [facility] Diet:Regular    [provider]    Allergies as of 05/27/2023   (No Known Allergies)    Family History  Problem Relation Age of Onset   Diabetes Sister    Colon cancer Brother    Leukemia Brother     Social History   Socioeconomic History   Marital status: Single    Spouse name: Not on file   Number of children: Not on file   Years of education: Not on file   Highest education level: Not on file  Occupational History   Not on file  Tobacco Use   Smoking status: Every Day    Types: Cigarettes    Passive exposure: Current   Smokeless tobacco: Never   Tobacco comments:    Smokes 1-3 cigarettes daily  Vaping Use   Vaping status: Never Used  Substance and Sexual Activity   Alcohol use: Yes    Comment: beer every now and then   Drug use: No   Sexual activity: Not on file  Other Topics Concern   Not on file  Social History Narrative   Not on file   Social Determinants of Health   Financial Resource Strain: Not on file  Food Insecurity: No Food Insecurity (02/23/2023)   Hunger Vital Sign    Worried About Running Out of Food in the Last Year: Never true    Ran Out of Food in the Last Year: Never true  Transportation Needs: No Transportation Needs (02/23/2023)   PRAPARE - Administrator, Civil Service (Medical): No    Lack of Transportation (Non-Medical): No  Physical Activity: Not on file  Stress: Not on file  Social Connections: Not on file  Intimate Partner Violence: Not At Risk (02/23/2023)   Humiliation, Afraid, Rape, and Kick questionnaire    Fear of Current or Ex-Partner: No    Emotionally Abused: No    Physically Abused:  No    Sexually Abused: No    Review of Systems: See HPI, otherwise negative ROS  Physical Exam: Vital signs in last 24 hours: Temp:  [97.9 F (36.6 C)] 97.9 F (36.6 C) (11/14 0648) Pulse Rate:  [80] 80 (11/14 0648) Resp:  [16] 16 (11/14 0648) BP: (130)/(91) 130/91 (11/14 0648) SpO2:  [100 %] 100 % (11/14 0648) Weight:  [58 kg] 58 kg (11/14 0648)   General:   Alert,  Well-developed, well-nourished, pleasant and cooperative in NAD Head:  Normocephalic and atraumatic. Eyes:  Sclera clear, no icterus.   Conjunctiva pink. Ears:  Normal auditory acuity. Nose:  No deformity, discharge,  or lesions. Msk:  Symmetrical without gross deformities. Normal posture. Extremities:  Without clubbing or edema. Neurologic:  Alert and  oriented x4;  grossly normal neurologically. Skin:  Intact without significant lesions or rashes. Psych:  Alert and cooperative. Normal mood and affect.  Impression/Plan:   Kevin Dalton is a 67  y.o. male with PMH of neurofibromatosis, HTN, adenomatous colon polyps is here for EGD to assess healing of PUD.   The risks of the procedure including infection, bleed, or perforation as well as benefits, limitations, alternatives and imponderables have been reviewed with the patient. Questions have been answered. All parties agreeable.

## 2023-06-23 NOTE — Anesthesia Postprocedure Evaluation (Signed)
Anesthesia Post Note  Patient: Kevin Dalton  Procedure(s) Performed: ESOPHAGOGASTRODUODENOSCOPY (EGD) WITH PROPOFOL BIOPSY  Patient location during evaluation: PACU Anesthesia Type: General Level of consciousness: awake and alert Pain management: pain level controlled Vital Signs Assessment: post-procedure vital signs reviewed and stable Respiratory status: spontaneous breathing, nonlabored ventilation, respiratory function stable and patient connected to nasal cannula oxygen Cardiovascular status: blood pressure returned to baseline and stable Postop Assessment: no apparent nausea or vomiting Anesthetic complications: no   There were no known notable events for this encounter.   Last Vitals:  Vitals:   06/23/23 0854 06/23/23 0904  BP: (!) 87/63 117/73  Pulse: 79 74  Resp: 20 18  Temp: 36.4 C   SpO2: 96% 97%    Last Pain:  Vitals:   06/23/23 0904  TempSrc:   PainSc: 0-No pain                 Clarivel Callaway L Suhaib Guzzo

## 2023-06-23 NOTE — Op Note (Signed)
[facility] Patient Name: Kevin Dalton Procedure Date: 06/23/2023 8:18 AM MRN: [id number] Date of Birth: [birth year] Attending MD: Sanjuan Dame , MD, [id number] CSN: [id number] Age: 67 Admit Type: Outpatient Procedure:                Upper GI endoscopy Indications:              Follow-up of peptic ulcer, For therapy of peptic                            ulcer Providers:                Sanjuan Dame, MD, Angelica Ran, Lennice Sites                            Technician, Technician Referring MD:              Medicines:                Monitored Anesthesia Care Complications:            No immediate complications. Estimated Blood Loss:     Estimated blood loss was minimal. Procedure:                Pre-Anesthesia Assessment:                           - Prior to the procedure, a History and Physical                            was performed, and patient medications and                            allergies were reviewed. The patient's tolerance of                            previous anesthesia was also reviewed. The risks                            and benefits of the procedure and the sedation                            options and risks were discussed with the patient.                            All questions were answered, and informed consent                            was obtained. Prior Anticoagulants: The patient has                            taken no anticoagulant or antiplatelet agents. ASA                            Grade Assessment: III - A patient with severe  systemic disease. After reviewing the risks and                            benefits, the patient was deemed in satisfactory                            condition to undergo the procedure.                           After obtaining informed consent, the endoscope was                            passed under direct vision. Throughout the                            procedure, the patient's blood  pressure, pulse, and                            oxygen saturations were monitored continuously. The                            [id number]-[id number] ([id number]) scope was introduced through the                            mouth, and advanced to the second part of duodenum.                            The upper GI endoscopy was accomplished without                            difficulty. The patient tolerated the procedure                            well. Scope In: 8:42:05 AM Scope Out: 8:47:57 AM Total Procedure Duration: 0 hours 5 minutes 52 seconds  Findings:      The examined esophagus was normal.      Mildly erythematous mucosa without bleeding was found in the gastric       antrum. Biopsies were taken with a cold forceps for histology.      There is no endoscopic evidence of ulceration in the entire examined       stomach.      The duodenal bulb and second portion of the duodenum were normal. Impression:               - Normal esophagus.                           - Erythematous mucosa in the antrum. Biopsied. -                           -Previously seen antral ulcer have healed completely                           - Normal duodenal bulb and second portion of the  duodenum. Moderate Sedation:      Per Anesthesia Care Recommendation:           - Patient has a contact number available for                            emergencies. The signs and symptoms of potential                            delayed complications were discussed with the                            patient. Return to normal activities tomorrow.                            Written discharge instructions were provided to the                            patient.                           - Resume previous diet.                           - Continue present medications.                           - Await pathology results. Procedure Code(s):        --- Professional ---                           [phone],  Esophagogastroduodenoscopy, flexible,                            transoral; with biopsy, single or multiple Diagnosis Code(s):        --- Professional ---                           K31.89, Other diseases of stomach and duodenum                           K27.9, Peptic ulcer, site unspecified, unspecified                            as acute or chronic, without hemorrhage or                            perforation CPT copyright 2022 American [employer]. All rights reserved. The codes documented in this report are preliminary and upon coder review may  be revised to meet current compliance requirements. Sanjuan Dame, MD Sanjuan Dame, MD 06/23/2023 8:53:31 AM This report has been signed electronically. Number of Addenda: 0

## 2023-06-23 NOTE — Transfer of Care (Signed)
Immediate Anesthesia Transfer of Care Note  Patient: Kevin Dalton  Procedure(s) Performed: ESOPHAGOGASTRODUODENOSCOPY (EGD) WITH PROPOFOL BIOPSY  Patient Location: Short Stay  Anesthesia Type:General  Level of Consciousness: sedated  Airway & Oxygen Therapy: Patient Spontanous Breathing  Post-op Assessment: Report given to RN  Post vital signs: Reviewed and stable  Last Vitals:  Vitals Value Taken Time  BP    Temp    Pulse    Resp    SpO2      Last Pain:  Vitals:   06/23/23 0838  TempSrc:   PainSc: 0-No pain      Patients Stated Pain Goal: 6 (06/23/23 4098)  Complications: No notable events documented.

## 2023-06-23 NOTE — Discharge Instructions (Signed)

## 2023-06-24 ENCOUNTER — Ambulatory Visit: Payer: Medicare HMO | Admitting: Urology

## 2023-06-24 VITALS — BP 133/81 | HR 92

## 2023-06-24 DIAGNOSIS — N401 Enlarged prostate with lower urinary tract symptoms: Secondary | ICD-10-CM | POA: Diagnosis not present

## 2023-06-24 DIAGNOSIS — N138 Other obstructive and reflux uropathy: Secondary | ICD-10-CM | POA: Diagnosis not present

## 2023-06-24 DIAGNOSIS — R339 Retention of urine, unspecified: Secondary | ICD-10-CM | POA: Diagnosis not present

## 2023-06-24 LAB — SURGICAL PATHOLOGY

## 2023-06-24 MED ORDER — ALFUZOSIN HCL ER 10 MG PO TB24: 10.0000 mg | ORAL_TABLET | Freq: Every evening | ORAL | 11 refills | Status: DC

## 2023-06-24 NOTE — Progress Notes (Unsigned)
post void residual= 89 ?

## 2023-06-24 NOTE — Progress Notes (Unsigned)
06/24/2023 11:34 AM   Kevin Dalton 1956/07/13 [id number]  Referring provider: Toma Deiters, MD [address] [place],  [place] [address]  Difficulty urinating   HPI: Mr Hooey is a 20JY here for evaluation of urinary retention. He was seen in the ER on 10/20 with an inability to urinate and had CIC performed. IPSS 20 QOL 5 on no BPh therapy. Nocturia 4x. PVR 89cc. He has intermittent straining to urinate. Urine stream is fair. No history of UTI. No other complaints today   PMH: Past Medical History:  Diagnosis Date   Hypertension    Neurofibromatosis     Surgical History: Past Surgical History:  Procedure Laterality Date   BIOPSY  02/24/2023   Procedure: BIOPSY;  Surgeon: Franky Macho, MD;  [facility];  Service: Endoscopy;;   COLONOSCOPY WITH PROPOFOL N/A 01/23/2021   Procedure: COLONOSCOPY WITH PROPOFOL;  Surgeon: Lanelle Bal, DO;  [facility];  Service: Endoscopy;  Laterality: N/A;  ASA I/II / 12:00   COLONOSCOPY WITH PROPOFOL N/A 09/22/2021   Procedure: COLONOSCOPY WITH PROPOFOL;  Surgeon: Lanelle Bal, DO;  [facility];  Service: Endoscopy;  Laterality: N/A;  10:30 / ASA 2   ESOPHAGOGASTRODUODENOSCOPY (EGD) WITH PROPOFOL N/A 02/24/2023   Procedure: ESOPHAGOGASTRODUODENOSCOPY (EGD) WITH PROPOFOL;  Surgeon: Franky Macho, MD;  [facility];  Service: Endoscopy;  Laterality: N/A;   POLYPECTOMY  01/23/2021   Procedure: POLYPECTOMY;  Surgeon: Lanelle Bal, DO;  [facility];  Service: Endoscopy;;   POLYPECTOMY  09/22/2021   Procedure: POLYPECTOMY;  Surgeon: Lanelle Bal, DO;  [facility];  Service: Endoscopy;;   TOTAL HIP ARTHROPLASTY Right 10/02/2021   Procedure: RIGHT TOTAL HIP ARTHROPLASTY ANTERIOR APPROACH;  Surgeon: Eldred Manges, MD;  Location: [facility] OR;  Service: Orthopedics;  Laterality: Right;    Home Medications:  Allergies as of 06/24/2023   No Known Allergies       Medication List        Accurate as of June 24, 2023 11:34 AM. If you have any questions, ask your nurse or doctor.          amLODipine 5 MG tablet Commonly known as: NORVASC Take 1 tablet (5 mg total) by mouth daily.   benazepril 10 MG tablet Commonly known as: LOTENSIN Take 10 mg by mouth daily.   ibandronate 150 MG tablet Commonly known as: BONIVA Take 150 mg by mouth every 30 (thirty) days.   methocarbamol 500 MG tablet Commonly known as: ROBAXIN Take 1 tablet (500 mg total) by mouth every 6 (six) hours as needed for muscle spasms.   NON FORMULARY Diet:Regular   pantoprazole 40 MG tablet Commonly known as: PROTONIX TAKE 1 TABLET BY MOUTH EVERY DAY        Allergies: No Known Allergies  Family History: Family History  Problem Relation Age of Onset   Diabetes Sister    Colon cancer Brother    Leukemia Brother     Social History:  reports that he has been smoking cigarettes. He has been exposed to tobacco smoke. He has never used smokeless tobacco. He reports current alcohol use. He reports that he does not use drugs.  ROS: All other review of systems were reviewed and are negative except what is noted above in HPI  Physical Exam: BP 133/81   Pulse 92   Constitutional:  Alert and oriented, No acute distress. HEENT: [place] AT, moist mucus  membranes.  Trachea midline, no masses. Cardiovascular: No clubbing, cyanosis, or edema. Respiratory: Normal respiratory effort, no increased work of breathing. GI: Abdomen is soft, nontender, nondistended, no abdominal masses GU: No CVA tenderness.  Lymph: No cervical or inguinal lymphadenopathy. Skin: No rashes, bruises or suspicious lesions. Neurologic: Grossly intact, no focal deficits, moving all 4 extremities. Psychiatric: Normal mood and affect.  Laboratory Data: Lab Results  Component Value Date   WBC 6.5 05/20/2023   HGB 12.4 (L) 05/20/2023   HCT 40.2 05/20/2023   MCV 84.8 05/20/2023   PLT 216  05/20/2023    Lab Results  Component Value Date   CREATININE 0.67 02/24/2023    No results found for: "PSA"  No results found for: "TESTOSTERONE"  No results found for: "HGBA1C"  Urinalysis    Component Value Date/Time   COLORURINE COLORLESS (A) 05/29/2023 0159   APPEARANCEUR CLEAR 05/29/2023 0159   LABSPEC 1.004 (L) 05/29/2023 0159   PHURINE 8.0 05/29/2023 0159   GLUCOSEU NEGATIVE 05/29/2023 0159   HGBUR NEGATIVE 05/29/2023 0159   BILIRUBINUR NEGATIVE 05/29/2023 0159   KETONESUR NEGATIVE 05/29/2023 0159   PROTEINUR NEGATIVE 05/29/2023 0159   UROBILINOGEN 1.0 03/25/2011 0812   NITRITE NEGATIVE 05/29/2023 0159   LEUKOCYTESUR NEGATIVE 05/29/2023 0159    No results found for: "LABMICR", "WBCUA", "RBCUA", "LABEPIT", "MUCUS", "BACTERIA"  Pertinent Imaging:  No results found for this or any previous visit.  No results found for this or any previous visit.  No results found for this or any previous visit.  No results found for this or any previous visit.  No results found for this or any previous visit.  No valid procedures specified. No results found for this or any previous visit.  No results found for this or any previous visit.   Assessment & Plan:    1. Benign prostatic hyperplasia with urinary obstruction -uroxatral 10mg  qhs  2. Urinary retention -we will start uroxatral 10mg  qhs   No follow-ups on file.  Wilkie Aye, MD  [facility] [place]

## 2023-06-28 ENCOUNTER — Encounter: Payer: Self-pay | Admitting: Urology

## 2023-06-28 NOTE — Patient Instructions (Signed)

## 2023-06-29 ENCOUNTER — Encounter (HOSPITAL_COMMUNITY): Payer: Self-pay | Admitting: Gastroenterology

## 2023-06-29 ENCOUNTER — Inpatient Hospital Stay: Payer: Medicare HMO

## 2023-06-29 DIAGNOSIS — D500 Iron deficiency anemia secondary to blood loss (chronic): Secondary | ICD-10-CM

## 2023-06-29 DIAGNOSIS — I10 Essential (primary) hypertension: Secondary | ICD-10-CM | POA: Diagnosis not present

## 2023-06-29 DIAGNOSIS — D509 Iron deficiency anemia, unspecified: Secondary | ICD-10-CM | POA: Diagnosis not present

## 2023-06-29 DIAGNOSIS — D75838 Other thrombocytosis: Secondary | ICD-10-CM | POA: Diagnosis not present

## 2023-06-29 DIAGNOSIS — F17210 Nicotine dependence, cigarettes, uncomplicated: Secondary | ICD-10-CM | POA: Diagnosis not present

## 2023-06-29 DIAGNOSIS — Z79899 Other long term (current) drug therapy: Secondary | ICD-10-CM | POA: Diagnosis not present

## 2023-06-29 LAB — CBC WITH DIFFERENTIAL/PLATELET
Abs Immature Granulocytes: 0.01 10*3/uL (ref 0.00–0.07)
Basophils Absolute: 0 10*3/uL (ref 0.0–0.1)
Basophils Relative: 1 %
Eosinophils Absolute: 0.1 10*3/uL (ref 0.0–0.5)
Eosinophils Relative: 1 %
HCT: 40 % (ref 39.0–52.0)
Hemoglobin: 12.8 g/dL — ABNORMAL LOW (ref 13.0–17.0)
Immature Granulocytes: 0 %
Lymphocytes Relative: 20 %
Lymphs Abs: 1.2 10*3/uL (ref 0.7–4.0)
MCH: 28.1 pg (ref 26.0–34.0)
MCHC: 32 g/dL (ref 30.0–36.0)
MCV: 87.7 fL (ref 80.0–100.0)
Monocytes Absolute: 1.1 10*3/uL — ABNORMAL HIGH (ref 0.1–1.0)
Monocytes Relative: 18 %
Neutro Abs: 3.7 10*3/uL (ref 1.7–7.7)
Neutrophils Relative %: 60 %
Platelets: 253 10*3/uL (ref 150–400)
RBC: 4.56 MIL/uL (ref 4.22–5.81)
RDW: 25.5 % — ABNORMAL HIGH (ref 11.5–15.5)
WBC: 6.1 10*3/uL (ref 4.0–10.5)
nRBC: 0 % (ref 0.0–0.2)

## 2023-06-29 LAB — IRON AND TIBC
Iron: 53 ug/dL (ref 45–182)
Saturation Ratios: 22 % (ref 17.9–39.5)
TIBC: 239 ug/dL — ABNORMAL LOW (ref 250–450)
UIBC: 186 ug/dL

## 2023-06-29 LAB — FERRITIN: Ferritin: 119 ng/mL (ref 24–336)

## 2023-06-30 ENCOUNTER — Inpatient Hospital Stay: Payer: Medicare HMO | Admitting: Oncology

## 2023-06-30 VITALS — BP 126/81 | HR 92 | Temp 97.6°F | Resp 18 | Wt 134.3 lb

## 2023-06-30 DIAGNOSIS — Z720 Tobacco use: Secondary | ICD-10-CM

## 2023-06-30 DIAGNOSIS — D509 Iron deficiency anemia, unspecified: Secondary | ICD-10-CM | POA: Diagnosis not present

## 2023-06-30 DIAGNOSIS — D500 Iron deficiency anemia secondary to blood loss (chronic): Secondary | ICD-10-CM | POA: Diagnosis not present

## 2023-06-30 DIAGNOSIS — Z79899 Other long term (current) drug therapy: Secondary | ICD-10-CM | POA: Diagnosis not present

## 2023-06-30 DIAGNOSIS — K254 Chronic or unspecified gastric ulcer with hemorrhage: Secondary | ICD-10-CM

## 2023-06-30 DIAGNOSIS — D75838 Other thrombocytosis: Secondary | ICD-10-CM | POA: Diagnosis not present

## 2023-06-30 DIAGNOSIS — F17210 Nicotine dependence, cigarettes, uncomplicated: Secondary | ICD-10-CM | POA: Diagnosis not present

## 2023-06-30 DIAGNOSIS — I10 Essential (primary) hypertension: Secondary | ICD-10-CM | POA: Diagnosis not present

## 2023-06-30 NOTE — Assessment & Plan Note (Addendum)
Improved from previous visit with iron supplementation. Ulcer healed, which was likely the source of bleeding and iron deficiency. Tsat increased from 15 to 22.  Ferritin stable.  S/p IV iron-Venofer 400 mg x 3 doses -Continue iron supplementation every other day. -Check labs in 3 months to assess for continued improvement or need for further workup.

## 2023-06-30 NOTE — Patient Instructions (Signed)
VISIT SUMMARY:  During today's visit, we discussed your progress with iron deficiency anemia and your smoking habits. You reported feeling better overall and have been consistent with your iron supplementation. We also addressed the need to quit smoking due to changes in your lung sounds.  YOUR PLAN:  -IRON DEFICIENCY ANEMIA: Iron deficiency anemia occurs when your body doesn't have enough iron to produce adequate levels of hemoglobin, which is necessary for carrying oxygen in the blood. Your condition has improved with iron supplementation, and your hemoglobin levels have increased from 15 to 22. Please continue taking your iron pill every other day. We will check your labs in 3 months to ensure continued improvement and determine if further workup is needed.  -TOBACCO USE: Smoking can cause significant damage to your lungs, as indicated by the changes in your lung sounds. It is very important that you quit smoking to prevent further damage and improve your overall health. We strongly advise you to stop smoking.  INSTRUCTIONS:  Please follow up in 3 months for lab work to assess your iron levels and overall progress. If you have any concerns or experience any new symptoms, contact our office immediately.

## 2023-06-30 NOTE — Assessment & Plan Note (Signed)
Patient admits to cigarette smoking. -Advised to quit smoking

## 2023-06-30 NOTE — Assessment & Plan Note (Signed)
-  Thrombocytosis on last visit, likely reactive -Resolved at this time

## 2023-06-30 NOTE — Progress Notes (Signed)
I reviewed the pathology results. Ann, can you send her a letter with the findings as described below please?  Thanks,  Vista Lawman, MD Gastroenterology and Hepatology [facility]  ---------------------------------------------------------------------------------------------  [facility] [address], [address], [place], [place] [address] Phone:  [phone]   06/30/23 [name], [place]   Dear Kevin Dalton,  I am writing to inform you that the biopsies taken during your recent endoscopic examination showed:  No H. Pylori bacteria in stomach , or any early cancer changes to the stomach mucosa ( Intestinal metaplasia) .  Also good news upper endoscopy demonstrated healed stomach ulcer.  I again recommend avoiding any NSAIDs; Stop using high dose aspirin including Goody/BC powders, NSAIDs such as Aleve, ibuprofen, naproxen, Motrin, Voltaren or Advil (even the topical ones)  Please call us at [phone] if you have persistent problems or have questions about your condition that have not been fully answered at this time.  Sincerely,  Vista Lawman, MD Gastroenterology and Hepatology

## 2023-06-30 NOTE — Assessment & Plan Note (Signed)
Repeat endoscopy showed healed PUD -Avoid NSAIDs, continue taking Protonix

## 2023-06-30 NOTE — Progress Notes (Signed)
[facility]  HEMATOLOGY FOLLOW-UP VISIT  Kevin Deiters, MD  REASON FOR FOLLOW-UP: Iron deficiency anemia  ASSESSMENT & PLAN:  Patient is a 67 year old male with past medical history of hypertension and neurofibromatosis referred by his gastroenterologist for iron deficiency anemia.  Iron deficiency anemia Improved from previous visit with iron supplementation. Ulcer healed, which was likely the source of bleeding and iron deficiency. Tsat increased from 15 to 22.  Ferritin stable.  S/p IV iron-Venofer 400 mg x 3 doses -Continue iron supplementation every other day. -Check labs in 3 months to assess for continued improvement or need for further workup.  Reactive thrombocytosis -Thrombocytosis on last visit, likely reactive -Resolved at this time  GI bleeding Repeat endoscopy showed healed PUD -Avoid NSAIDs, continue taking Protonix  Tobacco use Patient admits to cigarette smoking. -Advised to quit smoking    Orders Placed This Encounter  Procedures   Ferritin    Standing Status:   Future    Standing Expiration Date:   06/29/2024   Folate    Standing Status:   Future    Standing Expiration Date:   06/29/2024   Vitamin B12    Standing Status:   Future    Standing Expiration Date:   06/29/2024   CBC with Differential/Platelet    Standing Status:   Future    Standing Expiration Date:   06/29/2024   Comprehensive metabolic panel    Standing Status:   Future    Standing Expiration Date:   06/29/2024   Iron and TIBC    Standing Status:   Future    Standing Expiration Date:   06/29/2024    The total time spent in the appointment was 15 minutes encounter with patients including review of chart and various tests results, discussions about plan of care and coordination of care plan   All questions were answered. The patient knows to call the clinic with any problems, questions or concerns. No barriers to learning was  detected.  Cindie Crumbly, MD 11/21/20241:54 PM   SUMMARY OF HEMATOLOGIC HISTORY:    Latest Ref Rng & Units 06/29/2023    8:31 AM 05/20/2023   10:10 AM 04/06/2023    8:40 AM  CBC  WBC 4.0 - 10.5 K/uL 6.1  6.5  9.6   Hemoglobin 13.0 - 17.0 g/dL 16.1  09.6  8.1   Hematocrit 39.0 - 52.0 % 40.0  40.2  27.5   Platelets 150 - 400 K/uL 253  216  560     Lab Results  Component Value Date   IRON 53 06/29/2023   TIBC 239 (L) 06/29/2023   FERRITIN 119 06/29/2023      INTERVAL HISTORY: Kevin Dalton 67 y.o. male is here for follow-up for iron deficiency anemia.  He reports that his fatigue is improved. He reports feeling better overall and has been compliant with his iron supplementation regimen, taking an iron pill every other day. He has not had any bleeding since the ulcer healed.  He has no other complaints today.  I have reviewed the past medical history, past surgical history, social history and family history with the patient   ALLERGIES:  has No Known Allergies.  MEDICATIONS:  Current Outpatient Medications  Medication Sig Dispense Refill   alfuzosin (UROXATRAL) 10 MG 24 hr tablet Take 1 tablet (10 mg total) by mouth at bedtime. 30 tablet 11   amLODipine (NORVASC) 5 MG tablet Take 1 tablet (5 mg total) by mouth  daily. 30 tablet 0   benazepril (LOTENSIN) 10 MG tablet Take 10 mg by mouth daily.     ibandronate (BONIVA) 150 MG tablet Take 150 mg by mouth every 30 (thirty) days.     methocarbamol (ROBAXIN) 500 MG tablet Take 1 tablet (500 mg total) by mouth every 6 (six) hours as needed for muscle spasms. 50 tablet 0   NON FORMULARY Diet:Regular     pantoprazole (PROTONIX) 40 MG tablet TAKE 1 TABLET BY MOUTH EVERY DAY 90 tablet 1   No current facility-administered medications for this visit.     REVIEW OF SYSTEMS:   Constitutional: Denies fevers, chills or night sweats Eyes: Denies blurriness of vision Ears, nose, mouth, throat, and face: Denies mucositis or sore  throat Respiratory: Denies cough, dyspnea or wheezes Cardiovascular: Denies palpitation, chest discomfort or lower extremity swelling Gastrointestinal:  Denies nausea, heartburn or change in bowel habits Skin: Denies abnormal skin rashes Lymphatics: Denies new lymphadenopathy or easy bruising Neurological:Denies numbness, tingling or new weaknesses Behavioral/Psych: Mood is stable, no new changes  All other systems were reviewed with the patient and are negative.  PHYSICAL EXAMINATION:   Vitals:   06/30/23 1323  BP: 126/81  Pulse: 92  Resp: 18  Temp: 97.6 F (36.4 C)  SpO2: 98%    GENERAL:alert, no distress and comfortable SKIN: skin color, texture, turgor are normal, diffuse neurofibromatosis lesions all through the body EYES: normal, Conjunctiva are pink and non-injected, sclera clear LUNGS: clear to auscultation and percussion with normal breathing effort HEART: regular rate & rhythm and no murmurs and no lower extremity edema ABDOMEN:abdomen soft, non-tender and normal bowel sounds Musculoskeletal:no cyanosis of digits and no clubbing  NEURO: alert & oriented x 3 with fluent speech  LABORATORY DATA:  I have reviewed the data as listed  Lab Results  Component Value Date   WBC 6.1 06/29/2023   NEUTROABS 3.7 06/29/2023   HGB 12.8 (L) 06/29/2023   HCT 40.0 06/29/2023   MCV 87.7 06/29/2023   PLT 253 06/29/2023      Component Value Date/Time   NA 136 02/24/2023 0448   K 4.1 02/24/2023 0448   CL 110 02/24/2023 0448   CO2 21 (L) 02/24/2023 0448   GLUCOSE 95 02/24/2023 0448   BUN 29 (H) 02/24/2023 0448   CREATININE 0.67 02/24/2023 0448   CALCIUM 8.0 (L) 02/24/2023 0448   PROT 4.9 (L) 02/23/2023 1634   ALBUMIN 2.6 (L) 02/23/2023 1634   AST 12 (L) 02/23/2023 1634   ALT 9 02/23/2023 1634   ALKPHOS 53 02/23/2023 1634   BILITOT 0.4 02/23/2023 1634   GFRNONAA >60 02/24/2023 0448   GFRAA >60 02/27/2020 1531       Chemistry      Component Value Date/Time   NA  136 02/24/2023 0448   K 4.1 02/24/2023 0448   CL 110 02/24/2023 0448   CO2 21 (L) 02/24/2023 0448   BUN 29 (H) 02/24/2023 0448   CREATININE 0.67 02/24/2023 0448      Component Value Date/Time   CALCIUM 8.0 (L) 02/24/2023 0448   ALKPHOS 53 02/23/2023 1634   AST 12 (L) 02/23/2023 1634   ALT 9 02/23/2023 1634   BILITOT 0.4 02/23/2023 1634     Lab Results  Component Value Date   IRON 53 06/29/2023   TIBC 239 (L) 06/29/2023   FERRITIN 119 06/29/2023

## 2023-07-05 ENCOUNTER — Encounter (INDEPENDENT_AMBULATORY_CARE_PROVIDER_SITE_OTHER): Payer: Self-pay | Admitting: *Deleted

## 2023-09-05 DIAGNOSIS — I10 Essential (primary) hypertension: Secondary | ICD-10-CM | POA: Diagnosis not present

## 2023-09-05 DIAGNOSIS — Z96641 Presence of right artificial hip joint: Secondary | ICD-10-CM | POA: Diagnosis not present

## 2023-09-05 DIAGNOSIS — Z681 Body mass index (BMI) 19 or less, adult: Secondary | ICD-10-CM | POA: Diagnosis not present

## 2023-09-05 DIAGNOSIS — M5430 Sciatica, unspecified side: Secondary | ICD-10-CM | POA: Diagnosis not present

## 2023-09-05 DIAGNOSIS — K277 Chronic peptic ulcer, site unspecified, without hemorrhage or perforation: Secondary | ICD-10-CM | POA: Diagnosis not present

## 2023-09-05 DIAGNOSIS — R339 Retention of urine, unspecified: Secondary | ICD-10-CM | POA: Diagnosis not present

## 2023-09-05 DIAGNOSIS — J449 Chronic obstructive pulmonary disease, unspecified: Secondary | ICD-10-CM | POA: Diagnosis not present

## 2023-09-05 DIAGNOSIS — Q8501 Neurofibromatosis, type 1: Secondary | ICD-10-CM | POA: Diagnosis not present

## 2023-09-05 DIAGNOSIS — Z0000 Encounter for general adult medical examination without abnormal findings: Secondary | ICD-10-CM | POA: Diagnosis not present

## 2023-09-23 ENCOUNTER — Ambulatory Visit: Payer: Medicare HMO | Admitting: Urology

## 2023-09-29 ENCOUNTER — Inpatient Hospital Stay: Payer: Medicare HMO | Attending: Oncology

## 2023-09-29 DIAGNOSIS — E538 Deficiency of other specified B group vitamins: Secondary | ICD-10-CM | POA: Insufficient documentation

## 2023-09-29 DIAGNOSIS — D500 Iron deficiency anemia secondary to blood loss (chronic): Secondary | ICD-10-CM

## 2023-09-29 DIAGNOSIS — Z79899 Other long term (current) drug therapy: Secondary | ICD-10-CM | POA: Diagnosis not present

## 2023-09-29 DIAGNOSIS — D509 Iron deficiency anemia, unspecified: Secondary | ICD-10-CM | POA: Diagnosis not present

## 2023-09-29 DIAGNOSIS — F17210 Nicotine dependence, cigarettes, uncomplicated: Secondary | ICD-10-CM | POA: Insufficient documentation

## 2023-09-29 DIAGNOSIS — I10 Essential (primary) hypertension: Secondary | ICD-10-CM | POA: Diagnosis not present

## 2023-09-29 LAB — IRON AND TIBC
Iron: 69 ug/dL (ref 45–182)
Saturation Ratios: 25 % (ref 17.9–39.5)
TIBC: 277 ug/dL (ref 250–450)
UIBC: 208 ug/dL

## 2023-09-29 LAB — CBC WITH DIFFERENTIAL/PLATELET
Abs Immature Granulocytes: 0.02 10*3/uL (ref 0.00–0.07)
Basophils Absolute: 0 10*3/uL (ref 0.0–0.1)
Basophils Relative: 0 %
Eosinophils Absolute: 0 10*3/uL (ref 0.0–0.5)
Eosinophils Relative: 1 %
HCT: 43.2 % (ref 39.0–52.0)
Hemoglobin: 13.9 g/dL (ref 13.0–17.0)
Immature Granulocytes: 0 %
Lymphocytes Relative: 19 %
Lymphs Abs: 1.1 10*3/uL (ref 0.7–4.0)
MCH: 32.4 pg (ref 26.0–34.0)
MCHC: 32.2 g/dL (ref 30.0–36.0)
MCV: 100.7 fL — ABNORMAL HIGH (ref 80.0–100.0)
Monocytes Absolute: 0.9 10*3/uL (ref 0.1–1.0)
Monocytes Relative: 17 %
Neutro Abs: 3.5 10*3/uL (ref 1.7–7.7)
Neutrophils Relative %: 63 %
Platelets: 178 10*3/uL (ref 150–400)
RBC: 4.29 MIL/uL (ref 4.22–5.81)
RDW: 14.4 % (ref 11.5–15.5)
WBC: 5.6 10*3/uL (ref 4.0–10.5)
nRBC: 0 % (ref 0.0–0.2)

## 2023-09-29 LAB — COMPREHENSIVE METABOLIC PANEL
ALT: 11 U/L (ref 0–44)
AST: 19 U/L (ref 15–41)
Albumin: 3.6 g/dL (ref 3.5–5.0)
Alkaline Phosphatase: 67 U/L (ref 38–126)
Anion gap: 9 (ref 5–15)
BUN: 9 mg/dL (ref 8–23)
CO2: 23 mmol/L (ref 22–32)
Calcium: 9.1 mg/dL (ref 8.9–10.3)
Chloride: 102 mmol/L (ref 98–111)
Creatinine, Ser: 0.7 mg/dL (ref 0.61–1.24)
GFR, Estimated: >60 mL/min (ref >60)
Glucose, Bld: 135 mg/dL — ABNORMAL HIGH (ref 70–99)
Potassium: 4 mmol/L (ref 3.5–5.1)
Sodium: 134 mmol/L — ABNORMAL LOW (ref 135–145)
Total Bilirubin: 0.6 mg/dL (ref 0.0–1.2)
Total Protein: 6.9 g/dL (ref 6.5–8.1)

## 2023-09-29 LAB — VITAMIN B12: Vitamin B-12: 254 pg/mL (ref 180–914)

## 2023-09-29 LAB — FOLATE: Folate: 7.6 ng/mL (ref >5.9)

## 2023-09-29 LAB — FERRITIN: Ferritin: 55 ng/mL (ref 24–336)

## 2023-10-03 ENCOUNTER — Telehealth (HOSPITAL_COMMUNITY): Payer: Self-pay | Admitting: Physician Assistant

## 2023-10-03 ENCOUNTER — Encounter (HOSPITAL_COMMUNITY): Payer: Self-pay | Admitting: Emergency Medicine

## 2023-10-03 ENCOUNTER — Emergency Department (HOSPITAL_COMMUNITY)
Admission: EM | Admit: 2023-10-03 | Discharge: 2023-10-03 | Disposition: A | Discharge status: Home / Self Care | Payer: Medicare HMO | Attending: Emergency Medicine | Admitting: Emergency Medicine

## 2023-10-03 ENCOUNTER — Other Ambulatory Visit: Payer: Self-pay

## 2023-10-03 DIAGNOSIS — R059 Cough, unspecified: Secondary | ICD-10-CM | POA: Diagnosis present

## 2023-10-03 DIAGNOSIS — J101 Influenza due to other identified influenza virus with other respiratory manifestations: Secondary | ICD-10-CM | POA: Insufficient documentation

## 2023-10-03 DIAGNOSIS — I10 Essential (primary) hypertension: Secondary | ICD-10-CM | POA: Diagnosis not present

## 2023-10-03 DIAGNOSIS — Z79899 Other long term (current) drug therapy: Secondary | ICD-10-CM | POA: Insufficient documentation

## 2023-10-03 LAB — RESP PANEL BY RT-PCR (RSV, FLU A&B, COVID)  RVPGX2
Influenza A by PCR: POSITIVE — AB
Influenza B by PCR: NEGATIVE
Resp Syncytial Virus by PCR: NEGATIVE
SARS Coronavirus 2 by RT PCR: NEGATIVE

## 2023-10-03 LAB — GROUP A STREP BY PCR: Group A Strep by PCR: NOT DETECTED

## 2023-10-03 MED ORDER — OSELTAMIVIR PHOSPHATE 75 MG PO CAPS: 75.0000 mg | ORAL_CAPSULE | Freq: Two times a day (BID) | ORAL | 0 refills | Status: AC

## 2023-10-03 NOTE — Discharge Instructions (Addendum)
Return if any problems.  Drink plenty of fluids.   °

## 2023-10-03 NOTE — Telephone Encounter (Cosign Needed)
 Pt request pharmacy change.

## 2023-10-03 NOTE — ED Provider Notes (Signed)
 [place] EMERGENCY DEPARTMENT AT [facility] Provider Note   CSN: [id number] Arrival date & time: 10/03/23  1455     History  Chief Complaint  Patient presents with   Cough   Anorexia    Kevin Dalton is a 68 y.o. male.  Patient complains of a cough.  Patient reports that symptoms began today.  He reports that his neighbor tested positive for the flu.  Patient is asking about Tamiflu.  Patient denies any fever he denies any shortness of breath.  Patient is not having any chest or abdominal pain.  Patient has a past medical history of hypertension.  Patient denies smoking.  The history is provided by the patient. No language interpreter was used.  Cough      Home Medications Prior to Admission medications   Medication Sig Start Date End Date Taking? Authorizing Provider  oseltamivir (TAMIFLU) 75 MG capsule Take 1 capsule (75 mg total) by mouth 2 (two) times daily for 5 days. 10/03/23 10/08/23 Yes Elson Areas, PA-C  alfuzosin (UROXATRAL) 10 MG 24 hr tablet Take 1 tablet (10 mg total) by mouth at bedtime. 06/24/23   McKenzie, Mardene Celeste, MD  amLODipine (NORVASC) 5 MG tablet Take 1 tablet (5 mg total) by mouth daily. 10/19/21   Sharee Holster, NP  benazepril (LOTENSIN) 10 MG tablet Take 10 mg by mouth daily. 01/26/23   [provider]  ibandronate (BONIVA) 150 MG tablet Take 150 mg by mouth every 30 (thirty) days. 02/16/23   [provider]  methocarbamol (ROBAXIN) 500 MG tablet Take 1 tablet (500 mg total) by mouth every 6 (six) hours as needed for muscle spasms. 10/19/21   Sharee Holster, NP  NON FORMULARY Diet:Regular    [provider]  pantoprazole (PROTONIX) 40 MG tablet TAKE 1 TABLET BY MOUTH EVERY DAY 05/24/23   Ahmed, Juanetta Beets, MD      Allergies    Patient has no known allergies.    Review of Systems   Review of Systems  Respiratory:  Positive for cough.   All other systems reviewed and are negative.   Physical  Exam Updated Vital Signs BP (!) 131/98   Pulse 98   Temp 98.8 F (37.1 C) (Oral)   Resp 20   Ht 6' (1.829 m)   Wt 60.3 kg   SpO2 99%   BMI 18.04 kg/m  Physical Exam Vitals and nursing note reviewed.  Constitutional:      Appearance: He is well-developed.  HENT:     Head: Normocephalic.     Mouth/Throat:     Mouth: Mucous membranes are moist.  Eyes:     Pupils: Pupils are equal, round, and reactive to light.  Cardiovascular:     Rate and Rhythm: Normal rate.  Pulmonary:     Effort: Pulmonary effort is normal.  Abdominal:     General: There is no distension.  Musculoskeletal:        General: Normal range of motion.     Cervical back: Normal range of motion.  Skin:    General: Skin is warm.  Neurological:     General: No focal deficit present.     Mental Status: He is alert and oriented to person, place, and time.     ED Results / Procedures / Treatments   Labs (all labs ordered are listed, but only abnormal results are displayed) Labs Reviewed  RESP PANEL BY RT-PCR (RSV, FLU A&B, COVID)  [id number] - Abnormal;  Notable for the following components:      Result Value   Influenza A by PCR POSITIVE (*)    All other components within normal limits  GROUP A STREP BY PCR    EKG None  Radiology No results found.  Procedures Procedures    Medications Ordered in ED Medications - No data to display  ED Course/ Medical Decision Making/ A&P                                 Medical Decision Making Patient complains of cough and congestion.  Patient reports his neighbor just tested positive for the flu.  Patient thinks he has the flu  Amount and/or Complexity of Data Reviewed Labs: ordered. Decision-making details documented in ED Course.    Details: Labs ordered reviewed and interpreted.  Influenza A is positive  Risk Prescription drug management. Risk Details: Pt given a prescription            Final Clinical Impression(s) / ED Diagnoses Final  diagnoses:  Influenza A    Rx / DC Orders ED Discharge Orders          Ordered    oseltamivir (TAMIFLU) 75 MG capsule  2 times daily        10/03/23 1731           An After Visit Summary was printed and given to the patient.    Elson Areas, [place] 10/03/23 1750    Derwood Kaplan, MD 10/04/23 [phone]

## 2023-10-03 NOTE — ED Triage Notes (Signed)
 Pt reports a cough and a loss of appetite. Pt has been exposed to the flu. Pt would like his cough evaluated.

## 2023-10-03 NOTE — ED Provider Triage Note (Signed)
 Emergency Medicine Provider Triage Evaluation Note  Kevin Dalton , a 68 y.o. male  was evaluated in triage.  Pt complains of cough.  Pt's neighbor has had the flu  Review of Systems  Positive: cough Negative: fever  Physical Exam  BP (!) 131/98   Pulse 98   Temp 98.8 F (37.1 C) (Oral)   Resp 20   Ht 6' (1.829 m)   Wt 60.3 kg   SpO2 99%   BMI 18.04 kg/m  Gen:   Awake, no distress   Resp:  Normal effort  MSK:   Moves extremities without difficulty  Other:    Medical Decision Making  Medically screening exam initiated at 3:28 PM.  Appropriate orders placed.  ALANSON HAUSMANN was informed that the remainder of the evaluation will be completed by another provider, this initial triage assessment does not replace that evaluation, and the importance of remaining in the ED until their evaluation is complete.     Elson Areas, [place] 10/03/23 1534

## 2023-10-06 ENCOUNTER — Inpatient Hospital Stay (HOSPITAL_BASED_OUTPATIENT_CLINIC_OR_DEPARTMENT_OTHER): Payer: Medicare HMO | Admitting: Oncology

## 2023-10-06 VITALS — BP 126/90 | HR 78 | Temp 97.1°F | Resp 18 | Wt 131.2 lb

## 2023-10-06 DIAGNOSIS — D500 Iron deficiency anemia secondary to blood loss (chronic): Secondary | ICD-10-CM | POA: Diagnosis not present

## 2023-10-06 DIAGNOSIS — Z79899 Other long term (current) drug therapy: Secondary | ICD-10-CM | POA: Diagnosis not present

## 2023-10-06 DIAGNOSIS — Z720 Tobacco use: Secondary | ICD-10-CM

## 2023-10-06 DIAGNOSIS — D509 Iron deficiency anemia, unspecified: Secondary | ICD-10-CM | POA: Diagnosis not present

## 2023-10-06 DIAGNOSIS — K254 Chronic or unspecified gastric ulcer with hemorrhage: Secondary | ICD-10-CM | POA: Diagnosis not present

## 2023-10-06 DIAGNOSIS — E538 Deficiency of other specified B group vitamins: Secondary | ICD-10-CM | POA: Insufficient documentation

## 2023-10-06 DIAGNOSIS — F17210 Nicotine dependence, cigarettes, uncomplicated: Secondary | ICD-10-CM | POA: Diagnosis not present

## 2023-10-06 DIAGNOSIS — I10 Essential (primary) hypertension: Secondary | ICD-10-CM | POA: Diagnosis not present

## 2023-10-06 MED ORDER — VITAMIN B-12 1000 MCG PO TABS: 1000.0000 ug | ORAL_TABLET | Freq: Every day | ORAL | 2 refills | Status: AC

## 2023-10-06 NOTE — Assessment & Plan Note (Signed)
 Improved from previous visit with iron supplementation. Ulcer healed, which was likely the source of bleeding and iron deficiency. Tsat increased from 15 to 25.  Ferritin stable.  S/p IV iron-Venofer 400 mg x 3 doses -Can discontinue oral iron supplementation.

## 2023-10-06 NOTE — Progress Notes (Signed)
 [facility] at [facility]  HEMATOLOGY FOLLOW-UP VISIT  Kevin Deiters, MD  REASON FOR FOLLOW-UP: Iron deficiency anemia  ASSESSMENT & PLAN:  Patient is a 68 year old male with past medical history of hypertension and neurofibromatosis referred by his gastroenterologist for iron deficiency anemia.  Iron deficiency anemia Improved from previous visit with iron supplementation. Ulcer healed, which was likely the source of bleeding and iron deficiency. Tsat increased from 15 to 25.  Ferritin stable.  S/p IV iron-Venofer 400 mg x 3 doses -Can discontinue oral iron supplementation.  GI bleeding Repeat endoscopy showed healed PUD -Avoid NSAIDs, continue taking Protonix  Tobacco use Patient admits to cigarette smoking. -Advised to quit smoking  Vitamin B12 deficiency Patient has borderline vitamin B12 deficiency -Start oral vitamin B12 supplementation daily.  Patient has no further hematological needs at this time.  Will discharge from clinic to follow-up with primary care.  Recommended patient to reach out to [place] if he has any questions or concerns in future.  The total time spent in the appointment was 15 minutes encounter with patients including review of chart and various tests results, discussions about plan of care and coordination of care plan   All questions were answered. The patient knows to call the clinic with any problems, questions or concerns. No barriers to learning was detected.  Cindie Crumbly, MD 2/27/20252:58 PM   SUMMARY OF HEMATOLOGIC HISTORY: Iron deficiency anemia likely secondary to peptic ulcer disease -S/p IV Venofer 400mg  x 3 doses: 04/25/2023, 05/02/2023, 06/02/2023    INTERVAL HISTORY: Kevin Dalton 68 y.o. male is here for follow-up for iron deficiency anemia.  Patient was recently in the ER for flu and has started taking Tamiflu.He is he has no other complaints today.  Overall is doing well.  Denies melena, blood in stools and  fatigue.   I have reviewed the past medical history, past surgical history, social history and family history with the patient   ALLERGIES:  has no known allergies.  MEDICATIONS:  Current Outpatient Medications  Medication Sig Dispense Refill   alfuzosin (UROXATRAL) 10 MG 24 hr tablet Take 1 tablet (10 mg total) by mouth at bedtime. 30 tablet 11   amLODipine (NORVASC) 5 MG tablet Take 1 tablet (5 mg total) by mouth daily. 30 tablet 0   benazepril (LOTENSIN) 10 MG tablet Take 10 mg by mouth daily.     cyanocobalamin (VITAMIN B12) 1000 MCG tablet Take 1 tablet (1,000 mcg total) by mouth daily. 90 tablet 2   ibandronate (BONIVA) 150 MG tablet Take 150 mg by mouth every 30 (thirty) days.     methocarbamol (ROBAXIN) 500 MG tablet Take 1 tablet (500 mg total) by mouth every 6 (six) hours as needed for muscle spasms. 50 tablet 0   NON FORMULARY Diet:Regular     oseltamivir (TAMIFLU) 75 MG capsule Take 1 capsule (75 mg total) by mouth 2 (two) times daily for 5 days. 10 capsule 0   oseltamivir (TAMIFLU) 75 MG capsule Take 1 capsule (75 mg total) by mouth 2 (two) times daily for 5 days. 10 capsule 0   pantoprazole (PROTONIX) 40 MG tablet TAKE 1 TABLET BY MOUTH EVERY DAY 90 tablet 1   No current facility-administered medications for this visit.     REVIEW OF SYSTEMS:   Constitutional: Denies fevers, chills or night sweats Eyes: Denies blurriness of vision Ears, nose, mouth, throat, and face: Denies mucositis or sore throat Respiratory: Denies cough, dyspnea or wheezes Cardiovascular: Denies palpitation,  chest discomfort or lower extremity swelling Gastrointestinal:  Denies nausea, heartburn or change in bowel habits Skin: Denies abnormal skin rashes Lymphatics: Denies new lymphadenopathy or easy bruising Neurological:Denies numbness, tingling or new weaknesses Behavioral/Psych: Mood is stable, no new changes  All other systems were reviewed with the patient and are negative.  PHYSICAL  EXAMINATION:   Vitals:   10/06/23 1411  BP: (!) 126/90  Pulse: 78  Resp: 18  Temp: (!) 97.1 F (36.2 C)  SpO2: 100%     GENERAL:alert, no distress and comfortable SKIN: skin color, texture, turgor are normal, diffuse neurofibromatosis lesions all through the body EYES: normal, Conjunctiva are pink and non-injected, sclera clear LUNGS: clear to auscultation and percussion with normal breathing effort HEART: regular rate & rhythm and no murmurs and no lower extremity edema ABDOMEN:abdomen soft, non-tender and normal bowel sounds Musculoskeletal:no cyanosis of digits and no clubbing  NEURO: alert & oriented x 3 with fluent speech  LABORATORY DATA:  I have reviewed the data as listed  Lab Results  Component Value Date   WBC 5.6 09/29/2023   NEUTROABS 3.5 09/29/2023   HGB 13.9 09/29/2023   HCT 43.2 09/29/2023   MCV 100.7 (H) 09/29/2023   PLT 178 09/29/2023      Component Value Date/Time   NA 134 (L) 09/29/2023 1047   K 4.0 09/29/2023 1047   CL 102 09/29/2023 1047   CO2 23 09/29/2023 1047   GLUCOSE 135 (H) 09/29/2023 1047   BUN 9 09/29/2023 1047   CREATININE 0.70 09/29/2023 1047   CALCIUM 9.1 09/29/2023 1047   PROT 6.9 09/29/2023 1047   ALBUMIN 3.6 09/29/2023 1047   AST 19 09/29/2023 1047   ALT 11 09/29/2023 1047   ALKPHOS 67 09/29/2023 1047   BILITOT 0.6 09/29/2023 1047   GFRNONAA >60 09/29/2023 1047   GFRAA >60 02/27/2020 1531    Latest Reference Range & Units 09/29/23 10:47  Iron 45 - 182 ug/dL 69  UIBC ug/dL 191  TIBC 478 - 295 ug/dL 621  Saturation Ratios 17.9 - 39.5 % 25  Ferritin 24 - 336 ng/mL 55  Folate >5.9 ng/mL 7.6  Vitamin B12 180 - 914 pg/mL 254      Chemistry      Component Value Date/Time   NA 134 (L) 09/29/2023 1047   K 4.0 09/29/2023 1047   CL 102 09/29/2023 1047   CO2 23 09/29/2023 1047   BUN 9 09/29/2023 1047   CREATININE 0.70 09/29/2023 1047      Component Value Date/Time   CALCIUM 9.1 09/29/2023 1047   ALKPHOS 67 09/29/2023  1047   AST 19 09/29/2023 1047   ALT 11 09/29/2023 1047   BILITOT 0.6 09/29/2023 1047

## 2023-10-06 NOTE — Assessment & Plan Note (Signed)
 Repeat endoscopy showed healed PUD -Avoid NSAIDs, continue taking Protonix

## 2023-10-06 NOTE — Patient Instructions (Signed)
 VISIT SUMMARY:  You came in today for a follow-up visit. You have recovered from the flu and completed your medication. Your anemia, which was due to a healed ulcer, has resolved, and you are no longer experiencing any bleeding. You are continuing with your Vitamin B12 supplementation.  YOUR PLAN:  -IRON DEFICIENCY ANEMIA: Iron deficiency anemia occurs when your body doesn't have enough iron to produce adequate levels of hemoglobin, which is necessary for carrying oxygen in the blood. Your anemia has resolved, and your iron levels are good, so you can stop taking iron supplements.  -VITAMIN B12 DEFICIENCY: Vitamin B12 deficiency means your body lacks enough Vitamin B12, which is essential for nerve function and the production of red blood cells. Your levels are not severe but not optimal, so you should continue taking Vitamin B12 supplements once daily. A prescription has been sent to [facility] in [place].  -GENERAL HEALTH MAINTENANCE: You have been discharged from the clinic and should continue your care with your primary care physician. Return to the clinic if you need iron supplementation in the future.  INSTRUCTIONS:  Continue taking Vitamin B12 supplements once daily. A prescription has been sent to [facility] in [place]. Follow up with your primary care physician for ongoing care. Return to the clinic if you need iron supplementation in the future.

## 2023-10-06 NOTE — Assessment & Plan Note (Signed)
 Patient has borderline vitamin B12 deficiency -Start oral vitamin B12 supplementation daily.

## 2023-10-06 NOTE — Assessment & Plan Note (Signed)
 Patient admits to cigarette smoking. -Advised to quit smoking

## 2023-11-09 ENCOUNTER — Ambulatory Visit: Payer: Medicare HMO | Admitting: Urology

## 2023-11-09 VITALS — BP 130/92 | HR 83

## 2023-11-09 DIAGNOSIS — N138 Other obstructive and reflux uropathy: Secondary | ICD-10-CM | POA: Diagnosis not present

## 2023-11-09 DIAGNOSIS — N401 Enlarged prostate with lower urinary tract symptoms: Secondary | ICD-10-CM

## 2023-11-09 DIAGNOSIS — R339 Retention of urine, unspecified: Secondary | ICD-10-CM | POA: Diagnosis not present

## 2023-11-09 LAB — URINALYSIS, ROUTINE W REFLEX MICROSCOPIC
Bilirubin, UA: NEGATIVE
Glucose, UA: NEGATIVE
Ketones, UA: NEGATIVE
Leukocytes,UA: NEGATIVE
Nitrite, UA: NEGATIVE
Protein,UA: NEGATIVE
RBC, UA: NEGATIVE
Specific Gravity, UA: 1.015 (ref 1.005–1.030)
Urobilinogen, Ur: 1 mg/dL (ref 0.2–1.0)
pH, UA: 7 (ref 5.0–7.5)

## 2023-11-09 LAB — BLADDER SCAN AMB NON-IMAGING: Scan Result: 167

## 2023-11-09 MED ORDER — ALFUZOSIN HCL ER 10 MG PO TB24: 10.0000 mg | ORAL_TABLET | Freq: Every evening | ORAL | 11 refills | Status: DC

## 2023-11-09 NOTE — Progress Notes (Signed)
 11/09/2023 10:25 AM   Kevin Dalton July 01, 1956 [id number]  Referring provider: Toma Deiters, MD [address] [place],  [place] [address]  Followup urinary retention   HPI: Kevin Dalton is a 20JY here for followup for BPh with urinary retention. IPSS 6 QOL 2 on uroxatral 10mg  at bedtime. PVR 167cc. Uirne stream strong. Nocturia 1x. No straining to urinate. Urinary frequency every 4 hours which is improved from 1-2 hours.    PMH: Past Medical History:  Diagnosis Date   Hypertension    Neurofibromatosis     Surgical History: Past Surgical History:  Procedure Laterality Date   BIOPSY  02/24/2023   Procedure: BIOPSY;  Surgeon: Franky Macho, MD;  [facility];  Service: Endoscopy;;   BIOPSY  06/23/2023   Procedure: BIOPSY;  Surgeon: Franky Macho, MD;  [facility];  Service: Endoscopy;;   COLONOSCOPY WITH PROPOFOL N/A 01/23/2021   Procedure: COLONOSCOPY WITH PROPOFOL;  Surgeon: Lanelle Bal, DO;  [facility];  Service: Endoscopy;  Laterality: N/A;  ASA I/II / 12:00   COLONOSCOPY WITH PROPOFOL N/A 09/22/2021   Procedure: COLONOSCOPY WITH PROPOFOL;  Surgeon: Lanelle Bal, DO;  [facility];  Service: Endoscopy;  Laterality: N/A;  10:30 / ASA 2   ESOPHAGOGASTRODUODENOSCOPY (EGD) WITH PROPOFOL N/A 02/24/2023   Procedure: ESOPHAGOGASTRODUODENOSCOPY (EGD) WITH PROPOFOL;  Surgeon: Franky Macho, MD;  [facility];  Service: Endoscopy;  Laterality: N/A;   ESOPHAGOGASTRODUODENOSCOPY (EGD) WITH PROPOFOL N/A 06/23/2023   Procedure: ESOPHAGOGASTRODUODENOSCOPY (EGD) WITH PROPOFOL;  Surgeon: Franky Macho, MD;  [facility];  Service: Endoscopy;  Laterality: N/A;  8:45AM;ASA 3   POLYPECTOMY  01/23/2021   Procedure: POLYPECTOMY;  Surgeon: Lanelle Bal, DO;  [facility];  Service: Endoscopy;;   POLYPECTOMY  09/22/2021   Procedure: POLYPECTOMY;  Surgeon: Lanelle Bal, DO;  [facility];  Service: Endoscopy;;   TOTAL HIP ARTHROPLASTY Right 10/02/2021   Procedure: RIGHT TOTAL HIP ARTHROPLASTY ANTERIOR APPROACH;  Surgeon: Eldred Manges, MD;  Location: [facility] OR;  Service: Orthopedics;  Laterality: Right;    Home Medications:  Allergies as of 11/09/2023   No Known Allergies      Medication List        Accurate as of November 09, 2023 10:25 AM. If you have any questions, ask your nurse or doctor.          alfuzosin 10 MG 24 hr tablet Commonly known as: UROXATRAL Take 1 tablet (10 mg total) by mouth at bedtime.   amLODipine 5 MG tablet Commonly known as: NORVASC Take 1 tablet (5 mg total) by mouth daily.   benazepril 10 MG tablet Commonly known as: LOTENSIN Take 10 mg by mouth daily.   cyanocobalamin 1000 MCG tablet Commonly known as: VITAMIN B12 Take 1 tablet (1,000 mcg total) by mouth daily.   ibandronate 150 MG tablet Commonly known as: BONIVA Take 150 mg by mouth every 30 (thirty) days.   methocarbamol 500 MG tablet Commonly known as: ROBAXIN Take 1 tablet (500 mg total) by mouth every 6 (six) hours as needed for muscle spasms.   NON FORMULARY Diet:Regular   pantoprazole 40 MG tablet Commonly known as: PROTONIX TAKE 1 TABLET BY MOUTH EVERY DAY        Allergies: No Known Allergies  Family History: Family History  Problem Relation Age of Onset   Diabetes Sister    Colon cancer Brother  Leukemia Brother     Social History:  reports that he has been smoking cigarettes. He has been exposed to tobacco smoke. He has never used smokeless tobacco. He reports current alcohol use. He reports that he does not use drugs.  ROS: All other review of systems were reviewed and are negative except what is noted above in HPI  Physical Exam: BP (!) 130/92   Pulse 83   Constitutional:  Alert and oriented, No acute distress. HEENT: [place] AT, moist mucus membranes.  Trachea midline, no masses. Cardiovascular: No clubbing, cyanosis, or  edema. Respiratory: Normal respiratory effort, no increased work of breathing. GI: Abdomen is soft, nontender, nondistended, no abdominal masses GU: No CVA tenderness.  Lymph: No cervical or inguinal lymphadenopathy. Skin: No rashes, bruises or suspicious lesions. Neurologic: Grossly intact, no focal deficits, moving all 4 extremities. Psychiatric: Normal mood and affect.  Laboratory Data: Lab Results  Component Value Date   WBC 5.6 09/29/2023   HGB 13.9 09/29/2023   HCT 43.2 09/29/2023   MCV 100.7 (H) 09/29/2023   PLT 178 09/29/2023    Lab Results  Component Value Date   CREATININE 0.70 09/29/2023    No results found for: "PSA"  No results found for: "TESTOSTERONE"  No results found for: "HGBA1C"  Urinalysis    Component Value Date/Time   COLORURINE COLORLESS (A) 05/29/2023 0159   APPEARANCEUR CLEAR 05/29/2023 0159   LABSPEC 1.004 (L) 05/29/2023 0159   PHURINE 8.0 05/29/2023 0159   GLUCOSEU NEGATIVE 05/29/2023 0159   HGBUR NEGATIVE 05/29/2023 0159   BILIRUBINUR NEGATIVE 05/29/2023 0159   KETONESUR NEGATIVE 05/29/2023 0159   PROTEINUR NEGATIVE 05/29/2023 0159   UROBILINOGEN 1.0 03/25/2011 0812   NITRITE NEGATIVE 05/29/2023 0159   LEUKOCYTESUR NEGATIVE 05/29/2023 0159    No results found for: "LABMICR", "WBCUA", "RBCUA", "LABEPIT", "MUCUS", "BACTERIA"  Pertinent Imaging:  No results found for this or any previous visit.  No results found for this or any previous visit.  No results found for this or any previous visit.  No results found for this or any previous visit.  No results found for this or any previous visit.  No results found for this or any previous visit.  No results found for this or any previous visit.  No results found for this or any previous visit.   Assessment & Plan:    1. Benign prostatic hyperplasia with urinary obstruction (Primary) Continue uroxatral 10mg  daily - Urinalysis, Routine w reflex microscopic - BLADDER SCAN AMB  NON-IMAGING  2. Urinary retention Continue uroxatral 10mg  daily   No follow-ups on file.  Wilkie Aye, MD  [facility] [place]

## 2023-11-09 NOTE — Progress Notes (Signed)
 post void residual=167

## 2023-11-15 ENCOUNTER — Other Ambulatory Visit (INDEPENDENT_AMBULATORY_CARE_PROVIDER_SITE_OTHER): Payer: Self-pay | Admitting: Gastroenterology

## 2023-11-15 DIAGNOSIS — K279 Peptic ulcer, site unspecified, unspecified as acute or chronic, without hemorrhage or perforation: Secondary | ICD-10-CM

## 2023-11-17 ENCOUNTER — Encounter: Payer: Self-pay | Admitting: Urology

## 2023-11-17 NOTE — Patient Instructions (Signed)

## 2023-12-05 DIAGNOSIS — J449 Chronic obstructive pulmonary disease, unspecified: Secondary | ICD-10-CM | POA: Diagnosis not present

## 2023-12-05 DIAGNOSIS — R339 Retention of urine, unspecified: Secondary | ICD-10-CM | POA: Diagnosis not present

## 2023-12-05 DIAGNOSIS — I10 Essential (primary) hypertension: Secondary | ICD-10-CM | POA: Diagnosis not present

## 2023-12-05 DIAGNOSIS — Z681 Body mass index (BMI) 19 or less, adult: Secondary | ICD-10-CM | POA: Diagnosis not present

## 2023-12-05 DIAGNOSIS — K277 Chronic peptic ulcer, site unspecified, without hemorrhage or perforation: Secondary | ICD-10-CM | POA: Diagnosis not present

## 2023-12-05 DIAGNOSIS — Z96641 Presence of right artificial hip joint: Secondary | ICD-10-CM | POA: Diagnosis not present

## 2023-12-05 DIAGNOSIS — M5430 Sciatica, unspecified side: Secondary | ICD-10-CM | POA: Diagnosis not present

## 2023-12-05 DIAGNOSIS — Z0000 Encounter for general adult medical examination without abnormal findings: Secondary | ICD-10-CM | POA: Diagnosis not present

## 2023-12-05 DIAGNOSIS — Q8501 Neurofibromatosis, type 1: Secondary | ICD-10-CM | POA: Diagnosis not present

## 2024-02-01 ENCOUNTER — Other Ambulatory Visit (INDEPENDENT_AMBULATORY_CARE_PROVIDER_SITE_OTHER): Payer: Self-pay

## 2024-02-01 ENCOUNTER — Ambulatory Visit (INDEPENDENT_AMBULATORY_CARE_PROVIDER_SITE_OTHER): Admitting: Surgical

## 2024-02-01 ENCOUNTER — Encounter: Payer: Self-pay | Admitting: Surgical

## 2024-02-01 DIAGNOSIS — M5416 Radiculopathy, lumbar region: Secondary | ICD-10-CM

## 2024-02-01 DIAGNOSIS — M79604 Pain in right leg: Secondary | ICD-10-CM | POA: Diagnosis not present

## 2024-02-01 DIAGNOSIS — Z96641 Presence of right artificial hip joint: Secondary | ICD-10-CM | POA: Diagnosis not present

## 2024-02-01 NOTE — Progress Notes (Signed)
 Office Visit Note   Patient: Kevin Dalton           Date of Birth: 12-05-1955           MRN: [id number] Visit Date: 02/01/2024 Requested by: Orpha Yancey LABOR, MD [address] [place],  [place] [address] PCP: Orpha Yancey LABOR, MD  Subjective: Chief Complaint  Patient presents with   Lower Back - Pain   Right Leg - Pain    HPI: Kevin Dalton is a 68 y.o. male who presents to the office reporting right leg pain.  Has right-sided low back pain that has come on in the last 2 to 3 months.  Also has right leg pain he has noticed in the same timeframe.  This pain extends down from the right low back to the buttock and down the leg to the posterior aspect of the right knee.  No left leg symptoms.  He has history of prior right total hip arthroplasty done on 10/02/2021 by Dr. Barbarann.  No history of prior spine surgery.  Takes Tylenol  without relief.  Pain is worse when he is walking and standing for long periods of time.  He lays down to allow the pain to improve.  No fevers or chills.  No change in the appearance of his hip incision.  Not really any worse over the last few months and he does note it has occasional subjective weakness to his right leg.  No numbness or tingling..                ROS: All systems reviewed are negative as they relate to the chief complaint within the history of present illness.  Patient denies fevers or chills.  Assessment & Plan: Visit Diagnoses:  1. Pain in right leg   2. History of total replacement of right hip   3. Radiculopathy, lumbar region     Plan: Patient is a 68 year old male who presents for evaluation of right leg pain and right-sided low back pain.  Has radiating pain that extends down from the back to the posterior aspect of the right knee.  Radiographs taken today demonstrating right total hip arthroplasty that seems to be in stable position without any significant changes compared with immediate postop radiographs.  He does have fairly severe  degenerative changes of the lumbar spine with scoliosis and severe disc space narrowing noted at multiple levels.  With the onset of his low back pain and right leg radicular pain in the same timeframe, suspect that this is more referred pain from the low back.  Plan for further evaluation of disc pathology and nerve compression with MRI of the lumbar spine to evaluate right radiculopathy.  Follow-up after MRI to review results.  He has tried home exercise program without relief of his symptoms.  Follow-Up Instructions: No follow-ups on file.   Orders:  Orders Placed This Encounter  Procedures   XR HIP UNILAT W OR W/O PELVIS 2-3 VIEWS RIGHT   XR Lumbar Spine 2-3 Views   MR Lumbar Spine w/o contrast   No orders of the defined types were placed in this encounter.     Procedures: No procedures performed   Clinical Data: No additional findings.  Objective: Vital Signs: There were no vitals taken for this visit.  Physical Exam:  Constitutional: Patient appears well-developed HEENT:  Head: Normocephalic Eyes:EOM are normal Neck: Normal range of motion Cardiovascular: Normal rate Pulmonary/chest: Effort normal Neurologic: Patient is alert Skin: Skin is warm Psychiatric:  Patient has normal mood and affect  Ortho Exam: Ortho exam demonstrates right hip with minimal pain with passive hip motion.  Incision is well-healed over the anterolateral aspect of the hip.  No calf tenderness.  Negative Homans' sign.  He has intact right-sided hip flexion, quadricep, hamstring, dorsiflexion, plantarflexion strength rated 5/5 and equivalent compared with the contralateral leg.  No clonus noted bilaterally.  Negative straight leg raise bilaterally.  No tenderness over the greater trochanter.  No sinus tract noted of the incision.  Multiple skin lesions noted consistent with neurofibromatosis.  Specialty Comments:  No specialty comments available.  Imaging: No results found.   PMFS  History: Patient Active Problem List   Diagnosis Date Noted   Vitamin B12 deficiency 10/06/2023   Tobacco use 06/30/2023   Gastritis and gastroduodenitis 06/23/2023   Iron  deficiency anemia 04/21/2023   Reactive thrombocytosis 04/21/2023   Anemia due to chronic blood loss 04/05/2023   Acute gastric ulcer without hemorrhage or perforation 02/25/2023   H pylori ulcer 02/24/2023   GI bleeding 02/23/2023   Hypotension 02/23/2023   Hyponatremia 02/23/2023   Hx of total hip arthroplasty 12/03/2021   Hx of adenomatous colonic polyps 10/14/2021   Postoperative anemia 10/14/2021   H/O leukocytosis 10/14/2021   Essential hypertension 10/08/2021   Arthritis of right hip 10/02/2021   Neurofibromatosis (HCC) 08/27/2021   Other closed fractures of upper end of humerus 06/01/2011   Pain in joint, shoulder region 06/01/2011   Muscle weakness (generalized) 06/01/2011   Past Medical History:  Diagnosis Date   Hypertension    Neurofibromatosis     Family History  Problem Relation Age of Onset   Diabetes Sister    Colon cancer Brother    Leukemia Brother     Past Surgical History:  Procedure Laterality Date   BIOPSY  02/24/2023   Procedure: BIOPSY;  Surgeon: Cinderella Deatrice FALCON, MD;  [facility];  Service: Endoscopy;;   BIOPSY  06/23/2023   Procedure: BIOPSY;  Surgeon: Cinderella Deatrice FALCON, MD;  [facility];  Service: Endoscopy;;   COLONOSCOPY WITH PROPOFOL  N/A 01/23/2021   Procedure: COLONOSCOPY WITH PROPOFOL ;  Surgeon: Cindie Carlin POUR, DO;  [facility];  Service: Endoscopy;  Laterality: N/A;  ASA I/II / 12:00   COLONOSCOPY WITH PROPOFOL  N/A 09/22/2021   Procedure: COLONOSCOPY WITH PROPOFOL ;  Surgeon: Cindie Carlin POUR, DO;  [facility];  Service: Endoscopy;  Laterality: N/A;  10:30 / ASA 2   ESOPHAGOGASTRODUODENOSCOPY (EGD) WITH PROPOFOL  N/A 02/24/2023   Procedure: ESOPHAGOGASTRODUODENOSCOPY (EGD) WITH PROPOFOL ;  Surgeon: Cinderella Deatrice FALCON, MD;   [facility];  Service: Endoscopy;  Laterality: N/A;   ESOPHAGOGASTRODUODENOSCOPY (EGD) WITH PROPOFOL  N/A 06/23/2023   Procedure: ESOPHAGOGASTRODUODENOSCOPY (EGD) WITH PROPOFOL ;  Surgeon: Cinderella Deatrice FALCON, MD;  [facility];  Service: Endoscopy;  Laterality: N/A;  8:45AM;ASA 3   POLYPECTOMY  01/23/2021   Procedure: POLYPECTOMY;  Surgeon: Cindie Carlin POUR, DO;  [facility];  Service: Endoscopy;;   POLYPECTOMY  09/22/2021   Procedure: POLYPECTOMY;  Surgeon: Cindie Carlin POUR, DO;  [facility];  Service: Endoscopy;;   TOTAL HIP ARTHROPLASTY Right 10/02/2021   Procedure: RIGHT TOTAL HIP ARTHROPLASTY ANTERIOR APPROACH;  Surgeon: Barbarann Oneil BROCKS, MD;  Location: [facility] OR;  Service: Orthopedics;  Laterality: Right;   Social History   Occupational History   Not on file  Tobacco Use   Smoking status: Every Day    Types: Cigarettes  Passive exposure: Current   Smokeless tobacco: Never   Tobacco comments:    Smokes 1-3 cigarettes daily  Vaping Use   Vaping status: Never Used  Substance and Sexual Activity   Alcohol use: Yes    Comment: beer every now and then   Drug use: No   Sexual activity: Not on file

## 2024-02-11 ENCOUNTER — Ambulatory Visit (HOSPITAL_COMMUNITY)

## 2024-02-16 IMAGING — RF DG HIP (WITH OR WITHOUT PELVIS) 1V*R*
1 series · 3 of 3 positions shown · non-contrast
Comparison: None.

CLINICAL DATA: Right hip arthroplasty.

EXAM:
DG HIP (WITH OR WITHOUT PELVIS) 1V RIGHT

[Series 1: unknown protocol · right · 0.20mm/px · 3 of 3 slices shown]
[im 1/3]
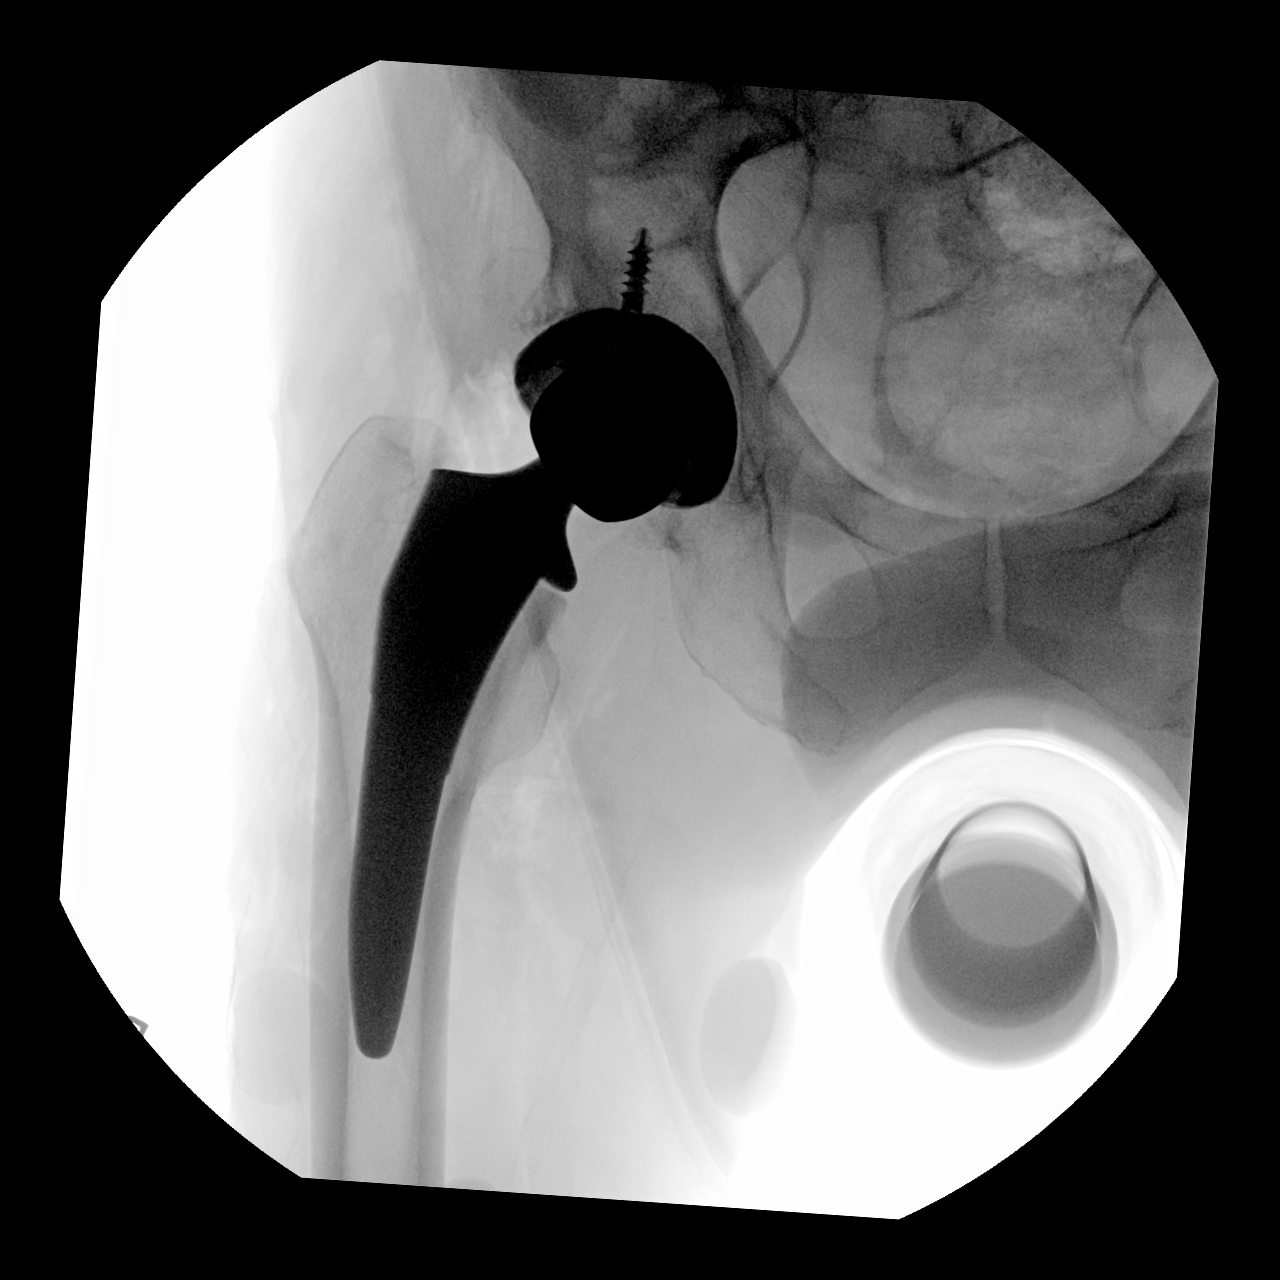
[im 2/3]
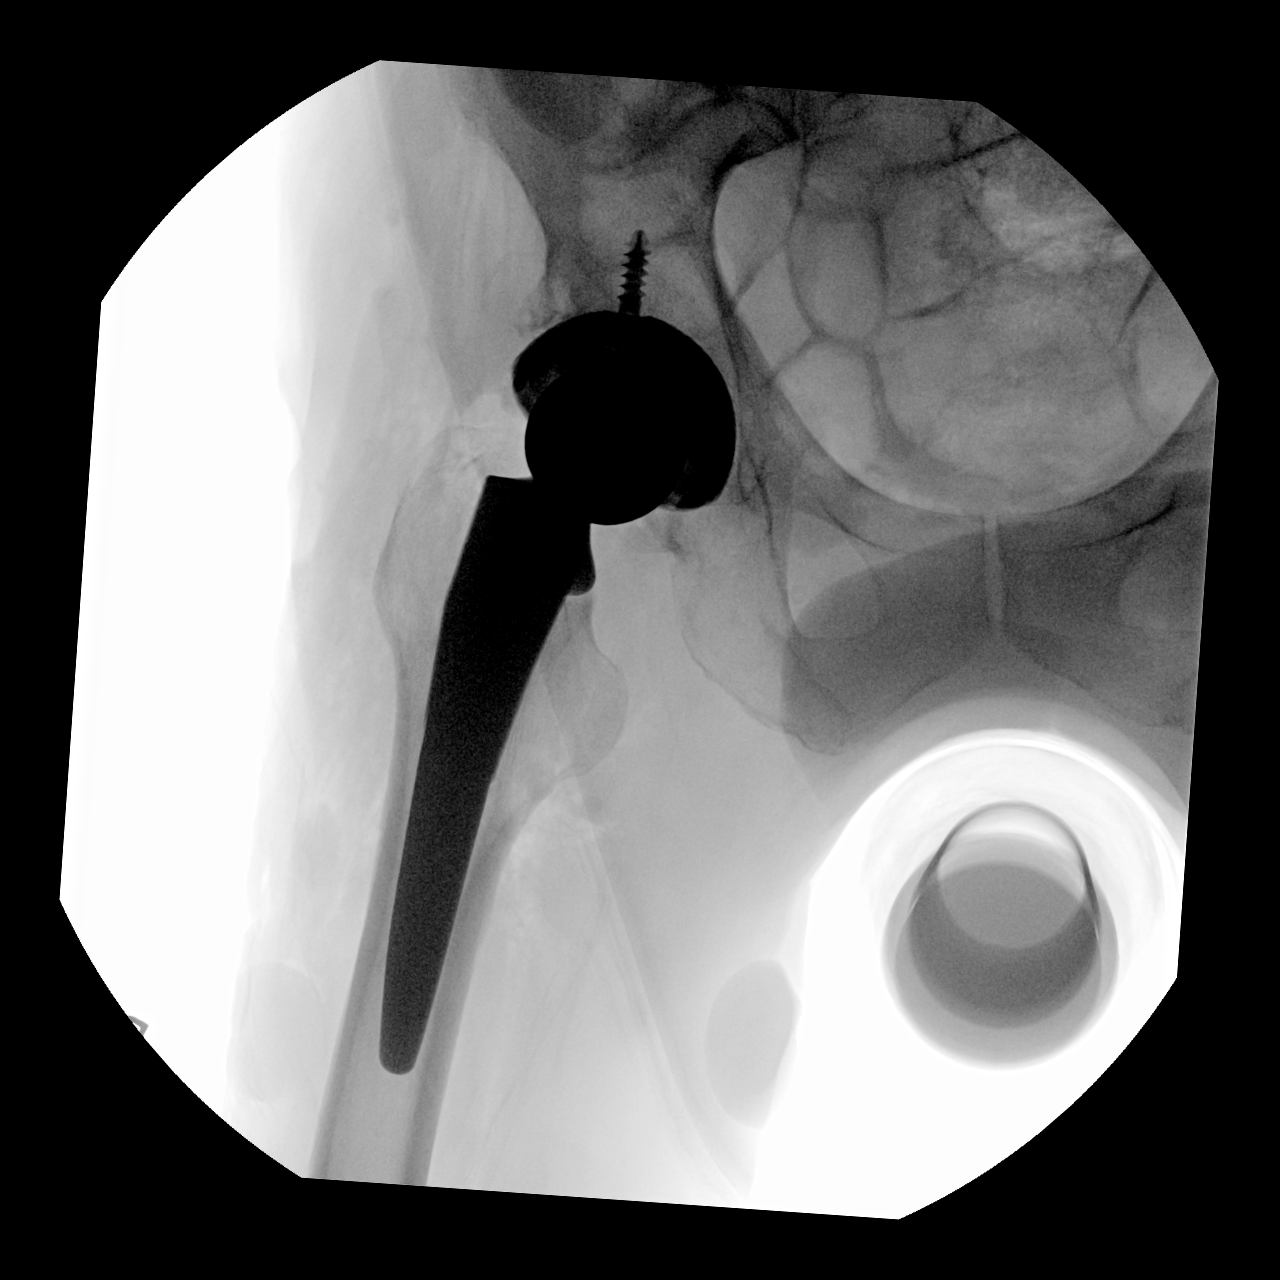
[im 3/3]
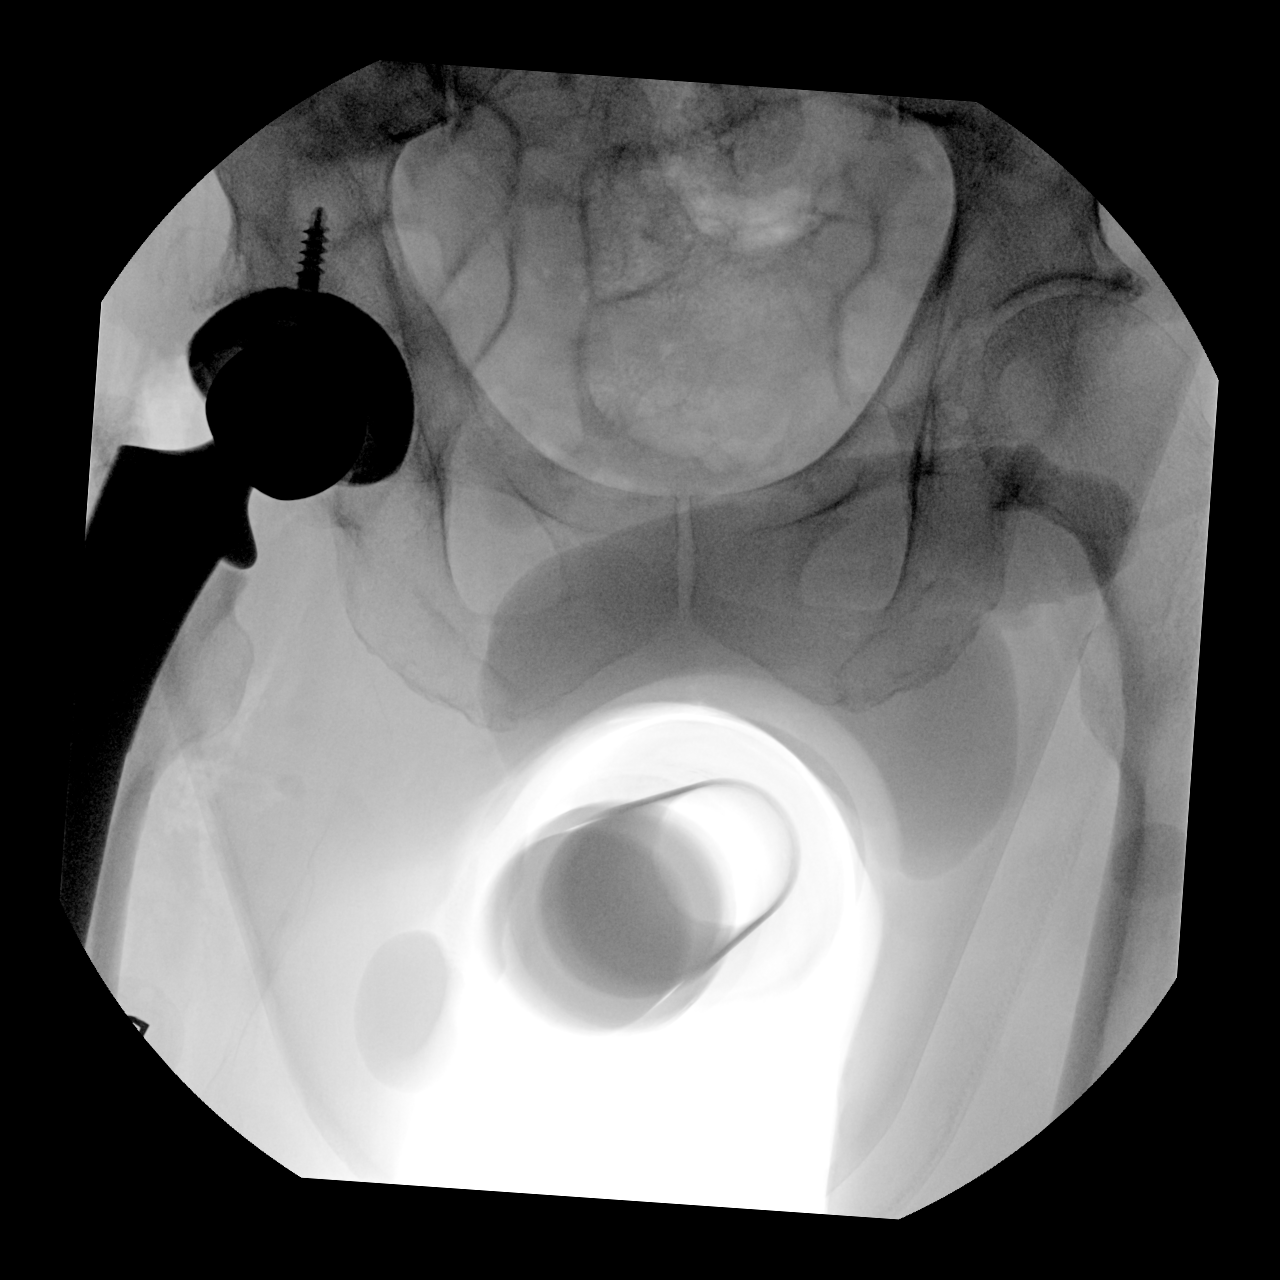

[3 of 3 positions shown; findings below may reference images not displayed]

FINDINGS: Single AP image of the lower pelvis demonstrates evidence of
patient's recent right total hip arthroplasty which appears intact
and normally located. Remainder the exam is unremarkable.
IMPRESSION: Expected changes post right total hip arthroplasty.

## 2024-03-07 DIAGNOSIS — I10 Essential (primary) hypertension: Secondary | ICD-10-CM | POA: Diagnosis not present

## 2024-03-07 DIAGNOSIS — K277 Chronic peptic ulcer, site unspecified, without hemorrhage or perforation: Secondary | ICD-10-CM | POA: Diagnosis not present

## 2024-03-07 DIAGNOSIS — M5430 Sciatica, unspecified side: Secondary | ICD-10-CM | POA: Diagnosis not present

## 2024-03-07 DIAGNOSIS — M65312 Trigger thumb, left thumb: Secondary | ICD-10-CM | POA: Diagnosis not present

## 2024-03-07 DIAGNOSIS — J449 Chronic obstructive pulmonary disease, unspecified: Secondary | ICD-10-CM | POA: Diagnosis not present

## 2024-03-07 DIAGNOSIS — Z681 Body mass index (BMI) 19 or less, adult: Secondary | ICD-10-CM | POA: Diagnosis not present

## 2024-03-07 DIAGNOSIS — Z96641 Presence of right artificial hip joint: Secondary | ICD-10-CM | POA: Diagnosis not present

## 2024-03-07 DIAGNOSIS — Q8501 Neurofibromatosis, type 1: Secondary | ICD-10-CM | POA: Diagnosis not present

## 2024-03-07 DIAGNOSIS — R339 Retention of urine, unspecified: Secondary | ICD-10-CM | POA: Diagnosis not present

## 2024-03-16 DIAGNOSIS — Z96641 Presence of right artificial hip joint: Secondary | ICD-10-CM | POA: Diagnosis not present

## 2024-03-16 DIAGNOSIS — Q8501 Neurofibromatosis, type 1: Secondary | ICD-10-CM | POA: Diagnosis not present

## 2024-03-16 DIAGNOSIS — R339 Retention of urine, unspecified: Secondary | ICD-10-CM | POA: Diagnosis not present

## 2024-03-16 DIAGNOSIS — J449 Chronic obstructive pulmonary disease, unspecified: Secondary | ICD-10-CM | POA: Diagnosis not present

## 2024-03-16 DIAGNOSIS — K277 Chronic peptic ulcer, site unspecified, without hemorrhage or perforation: Secondary | ICD-10-CM | POA: Diagnosis not present

## 2024-03-16 DIAGNOSIS — I10 Essential (primary) hypertension: Secondary | ICD-10-CM | POA: Diagnosis not present

## 2024-03-16 DIAGNOSIS — M65312 Trigger thumb, left thumb: Secondary | ICD-10-CM | POA: Diagnosis not present

## 2024-03-26 DIAGNOSIS — M1712 Unilateral primary osteoarthritis, left knee: Secondary | ICD-10-CM | POA: Diagnosis not present

## 2024-03-26 DIAGNOSIS — Z681 Body mass index (BMI) 19 or less, adult: Secondary | ICD-10-CM | POA: Diagnosis not present

## 2024-04-29 ENCOUNTER — Other Ambulatory Visit (INDEPENDENT_AMBULATORY_CARE_PROVIDER_SITE_OTHER): Payer: Self-pay | Admitting: Gastroenterology

## 2024-04-29 DIAGNOSIS — K279 Peptic ulcer, site unspecified, unspecified as acute or chronic, without hemorrhage or perforation: Secondary | ICD-10-CM

## 2024-05-09 ENCOUNTER — Encounter: Payer: Self-pay | Admitting: Oncology

## 2024-05-11 ENCOUNTER — Ambulatory Visit (INDEPENDENT_AMBULATORY_CARE_PROVIDER_SITE_OTHER): Admitting: Urology

## 2024-05-11 ENCOUNTER — Encounter: Payer: Self-pay | Admitting: Urology

## 2024-05-11 VITALS — BP 138/85 | HR 85

## 2024-05-11 DIAGNOSIS — R338 Other retention of urine: Secondary | ICD-10-CM

## 2024-05-11 DIAGNOSIS — N401 Enlarged prostate with lower urinary tract symptoms: Secondary | ICD-10-CM | POA: Diagnosis not present

## 2024-05-11 DIAGNOSIS — N138 Other obstructive and reflux uropathy: Secondary | ICD-10-CM

## 2024-05-11 DIAGNOSIS — R339 Retention of urine, unspecified: Secondary | ICD-10-CM

## 2024-05-11 LAB — URINALYSIS, ROUTINE W REFLEX MICROSCOPIC
Bilirubin, UA: NEGATIVE
Glucose, UA: NEGATIVE
Ketones, UA: NEGATIVE
Leukocytes,UA: NEGATIVE
Nitrite, UA: NEGATIVE
Protein,UA: NEGATIVE
RBC, UA: NEGATIVE
Specific Gravity, UA: 1.01 (ref 1.005–1.030)
Urobilinogen, Ur: 1 mg/dL (ref 0.2–1.0)
pH, UA: 7.5 (ref 5.0–7.5)

## 2024-05-11 LAB — BLADDER SCAN AMB NON-IMAGING
Scan Result: 215
Scan Result: 93

## 2024-05-11 MED ORDER — ALFUZOSIN HCL ER 10 MG PO TB24: 10.0000 mg | ORAL_TABLET | Freq: Every evening | ORAL | 3 refills | Status: AC

## 2024-05-11 NOTE — Patient Instructions (Signed)

## 2024-05-11 NOTE — Progress Notes (Signed)
 Bladder Scan completed today.  Patient cannot void prior to the bladder scan. Bladder scan result: 215  Performed By: Houston Surges Lpn  Bladder Scan completed today.  Patient can void prior to the bladder scan. Bladder scan result: 93  Performed By: Corneluis Allston Lpn

## 2024-05-11 NOTE — Progress Notes (Signed)
 05/11/2024 10:31 AM   Kevin Dalton 06/07/1956 [id number]  Referring provider: Sherrilee Kevin CROME, MD [address]  [address] [place],  [place] [address]  Followup BPH   HPI: Kevin Dalton is a 68yo here for followup for BPH and urinary retention. IPSS 6 QOL 1 on uroxatral  10mg  at bedtime. PVR 93cc. Nocturia 2x depending on fluid consumption.  Uirne stream strong. No straining to urinate   PMH: Past Medical History:  Diagnosis Date   Hypertension    Neurofibromatosis     Surgical History: Past Surgical History:  Procedure Laterality Date   BIOPSY  02/24/2023   Procedure: BIOPSY;  Surgeon: Cinderella Deatrice FALCON, MD;  [facility]: [facility];  Service: Endoscopy;;   BIOPSY  06/23/2023   Procedure: BIOPSY;  Surgeon: Cinderella Deatrice FALCON, MD;  [facility];  Service: Endoscopy;;   COLONOSCOPY WITH PROPOFOL  N/A 01/23/2021   Procedure: COLONOSCOPY WITH PROPOFOL ;  Surgeon: Cindie Carlin POUR, DO;  [facility];  Service: Endoscopy;  Laterality: N/A;  ASA I/II / 12:00   COLONOSCOPY WITH PROPOFOL  N/A 09/22/2021   Procedure: COLONOSCOPY WITH PROPOFOL ;  Surgeon: Cindie Carlin POUR, DO;  [facility];  Service: Endoscopy;  Laterality: N/A;  10:30 / ASA 2   ESOPHAGOGASTRODUODENOSCOPY (EGD) WITH PROPOFOL  N/A 02/24/2023   Procedure: ESOPHAGOGASTRODUODENOSCOPY (EGD) WITH PROPOFOL ;  Surgeon: Cinderella Deatrice FALCON, MD;  [facility];  Service: Endoscopy;  Laterality: N/A;   ESOPHAGOGASTRODUODENOSCOPY (EGD) WITH PROPOFOL  N/A 06/23/2023   Procedure: ESOPHAGOGASTRODUODENOSCOPY (EGD) WITH PROPOFOL ;  Surgeon: Cinderella Deatrice FALCON, MD;  [facility];  Service: Endoscopy;  Laterality: N/A;  8:45AM;ASA 3   POLYPECTOMY  01/23/2021   Procedure: POLYPECTOMY;  Surgeon: Cindie Carlin POUR, DO;  [facility];  Service: Endoscopy;;   POLYPECTOMY  09/22/2021   Procedure: POLYPECTOMY;  Surgeon: Cindie Carlin POUR, DO;  [facility];  Service:  Endoscopy;;   TOTAL HIP ARTHROPLASTY Right 10/02/2021   Procedure: RIGHT TOTAL HIP ARTHROPLASTY ANTERIOR APPROACH;  Surgeon: Barbarann Oneil BROCKS, MD;  Location: [facility];  Service: Orthopedics;  Laterality: Right;    Home Medications:  Allergies as of 05/11/2024   No Known Allergies      Medication List        Accurate as of May 11, 2024 10:31 AM. If you have any questions, ask your nurse or doctor.          alfuzosin  10 MG 24 hr tablet Commonly known as: UROXATRAL  Take 1 tablet (10 mg total) by mouth at bedtime.   amLODipine  5 MG tablet Commonly known as: NORVASC  Take 1 tablet (5 mg total) by mouth daily.   benazepril  10 MG tablet Commonly known as: LOTENSIN  Take 10 mg by mouth daily.   cyanocobalamin  1000 MCG tablet Commonly known as: VITAMIN B12 Take 1 tablet (1,000 mcg total) by mouth daily.   ibandronate 150 MG tablet Commonly known as: BONIVA Take 150 mg by mouth every 30 (thirty) days.   methocarbamol  500 MG tablet Commonly known as: ROBAXIN  Take 1 tablet (500 mg total) by mouth every 6 (six) hours as needed for muscle spasms.   NON FORMULARY Diet:Regular   pantoprazole  40 MG tablet Commonly known as: PROTONIX  TAKE 1 TABLET BY MOUTH EVERY DAY        Allergies: No Known Allergies  Family History: Family History  Problem Relation Age of Onset   Diabetes Sister    Colon cancer Brother    Leukemia Brother  Social History:  reports that he has been smoking cigarettes. He has been exposed to tobacco smoke. He has never used smokeless tobacco. He reports current alcohol use. He reports that he does not use drugs.  ROS: All other review of systems were reviewed and are negative except what is noted above in HPI  Physical Exam: BP 138/85   Pulse 85   Constitutional:  Alert and oriented, No acute distress. HEENT: [place] AT, moist mucus membranes.  Trachea midline, no masses. Cardiovascular: No clubbing, cyanosis, or edema. Respiratory: Normal  respiratory effort, no increased work of breathing. GI: Abdomen is soft, nontender, nondistended, no abdominal masses GU: No CVA tenderness.  Lymph: No cervical or inguinal lymphadenopathy. Skin: No rashes, bruises or suspicious lesions. Neurologic: Grossly intact, no focal deficits, moving all 4 extremities. Psychiatric: Normal mood and affect.  Laboratory Data: Lab Results  Component Value Date   WBC 5.6 09/29/2023   HGB 13.9 09/29/2023   HCT 43.2 09/29/2023   MCV 100.7 (H) 09/29/2023   PLT 178 09/29/2023    Lab Results  Component Value Date   CREATININE 0.70 09/29/2023    No results found for: PSA  No results found for: TESTOSTERONE  No results found for: HGBA1C  Urinalysis    Component Value Date/Time   COLORURINE COLORLESS (A) 05/29/2023 0159   APPEARANCEUR Clear 11/09/2023 1025   LABSPEC 1.004 (L) 05/29/2023 0159   PHURINE 8.0 05/29/2023 0159   GLUCOSEU Negative 11/09/2023 1025   HGBUR NEGATIVE 05/29/2023 0159   BILIRUBINUR Negative 11/09/2023 1025   KETONESUR NEGATIVE 05/29/2023 0159   PROTEINUR Negative 11/09/2023 1025   PROTEINUR NEGATIVE 05/29/2023 0159   UROBILINOGEN 1.0 03/25/2011 0812   NITRITE Negative 11/09/2023 1025   NITRITE NEGATIVE 05/29/2023 0159   LEUKOCYTESUR Negative 11/09/2023 1025   LEUKOCYTESUR NEGATIVE 05/29/2023 0159    Lab Results  Component Value Date   LABMICR Comment 11/09/2023    Pertinent Imaging:  No results found for this or any previous visit.  No results found for this or any previous visit.  No results found for this or any previous visit.  No results found for this or any previous visit.  No results found for this or any previous visit.  No results found for this or any previous visit.  No results found for this or any previous visit.  No results found for this or any previous visit.   Assessment & Plan:    1. Benign prostatic hyperplasia with urinary obstruction (Primary) -continue uroxatral   10mg   - Urinalysis, Routine w reflex microscopic - BLADDER SCAN AMB NON-IMAGING  2. Urinary retention Continue uroxatral  10mg    No follow-ups on file.  Kevin Clara, MD  [facility] [place]

## 2024-05-23 ENCOUNTER — Encounter (INDEPENDENT_AMBULATORY_CARE_PROVIDER_SITE_OTHER): Payer: Self-pay | Admitting: Gastroenterology

## 2024-05-31 DIAGNOSIS — J449 Chronic obstructive pulmonary disease, unspecified: Secondary | ICD-10-CM | POA: Diagnosis not present

## 2024-05-31 DIAGNOSIS — I10 Essential (primary) hypertension: Secondary | ICD-10-CM | POA: Diagnosis not present

## 2024-05-31 DIAGNOSIS — M810 Age-related osteoporosis without current pathological fracture: Secondary | ICD-10-CM | POA: Diagnosis not present

## 2024-05-31 DIAGNOSIS — Z5948 Other specified lack of adequate food: Secondary | ICD-10-CM | POA: Diagnosis not present

## 2024-05-31 DIAGNOSIS — N400 Enlarged prostate without lower urinary tract symptoms: Secondary | ICD-10-CM | POA: Diagnosis not present

## 2024-05-31 DIAGNOSIS — Q8500 Neurofibromatosis, unspecified: Secondary | ICD-10-CM | POA: Diagnosis not present

## 2024-05-31 DIAGNOSIS — M1990 Unspecified osteoarthritis, unspecified site: Secondary | ICD-10-CM | POA: Diagnosis not present

## 2024-05-31 DIAGNOSIS — G4700 Insomnia, unspecified: Secondary | ICD-10-CM | POA: Diagnosis not present

## 2024-05-31 DIAGNOSIS — K219 Gastro-esophageal reflux disease without esophagitis: Secondary | ICD-10-CM | POA: Diagnosis not present

## 2024-05-31 DIAGNOSIS — F325 Major depressive disorder, single episode, in full remission: Secondary | ICD-10-CM | POA: Diagnosis not present

## 2024-05-31 DIAGNOSIS — R2681 Unsteadiness on feet: Secondary | ICD-10-CM | POA: Diagnosis not present

## 2024-05-31 DIAGNOSIS — Z5941 Food insecurity: Secondary | ICD-10-CM | POA: Diagnosis not present

## 2024-05-31 DIAGNOSIS — R636 Underweight: Secondary | ICD-10-CM | POA: Diagnosis not present

## 2024-05-31 DIAGNOSIS — E785 Hyperlipidemia, unspecified: Secondary | ICD-10-CM | POA: Diagnosis not present

## 2024-05-31 DIAGNOSIS — Z681 Body mass index (BMI) 19 or less, adult: Secondary | ICD-10-CM | POA: Diagnosis not present

## 2024-05-31 DIAGNOSIS — Z9989 Dependence on other enabling machines and devices: Secondary | ICD-10-CM | POA: Diagnosis not present

## 2024-05-31 DIAGNOSIS — D75839 Thrombocytosis, unspecified: Secondary | ICD-10-CM | POA: Diagnosis not present

## 2024-06-01 DIAGNOSIS — I10 Essential (primary) hypertension: Secondary | ICD-10-CM | POA: Diagnosis not present

## 2024-06-01 DIAGNOSIS — M1712 Unilateral primary osteoarthritis, left knee: Secondary | ICD-10-CM | POA: Diagnosis not present

## 2024-06-11 ENCOUNTER — Encounter: Payer: Self-pay | Admitting: Radiology

## 2024-06-12 DIAGNOSIS — M1712 Unilateral primary osteoarthritis, left knee: Secondary | ICD-10-CM | POA: Diagnosis not present

## 2024-06-12 DIAGNOSIS — M5430 Sciatica, unspecified side: Secondary | ICD-10-CM | POA: Diagnosis not present

## 2024-06-12 DIAGNOSIS — Z681 Body mass index (BMI) 19 or less, adult: Secondary | ICD-10-CM | POA: Diagnosis not present

## 2024-06-12 DIAGNOSIS — Z96641 Presence of right artificial hip joint: Secondary | ICD-10-CM | POA: Diagnosis not present

## 2024-06-12 DIAGNOSIS — Q8501 Neurofibromatosis, type 1: Secondary | ICD-10-CM | POA: Diagnosis not present

## 2024-06-12 DIAGNOSIS — R339 Retention of urine, unspecified: Secondary | ICD-10-CM | POA: Diagnosis not present

## 2024-06-29 DIAGNOSIS — I10 Essential (primary) hypertension: Secondary | ICD-10-CM | POA: Diagnosis not present

## 2024-06-29 DIAGNOSIS — M1712 Unilateral primary osteoarthritis, left knee: Secondary | ICD-10-CM | POA: Diagnosis not present

## 2024-08-23 ENCOUNTER — Encounter (INDEPENDENT_AMBULATORY_CARE_PROVIDER_SITE_OTHER): Payer: Self-pay | Admitting: *Deleted

## 2025-05-17 ENCOUNTER — Ambulatory Visit: Admitting: Urology
